# Patient Record
Sex: Male | Born: 1950 | Race: White | Hispanic: No | Marital: Single | Smoking: Former smoker
Health system: Southern US, Community
[De-identification: ages and names within clinical notes are randomized; demographics above are authoritative.]

## PROBLEM LIST (undated history)

## (undated) DIAGNOSIS — M1990 Unspecified osteoarthritis, unspecified site: Secondary | ICD-10-CM

## (undated) DIAGNOSIS — F32A Depression, unspecified: Secondary | ICD-10-CM

## (undated) DIAGNOSIS — IMO0002 Reserved for concepts with insufficient information to code with codable children: Secondary | ICD-10-CM

## (undated) DIAGNOSIS — F329 Major depressive disorder, single episode, unspecified: Secondary | ICD-10-CM

## (undated) DIAGNOSIS — I219 Acute myocardial infarction, unspecified: Secondary | ICD-10-CM

## (undated) DIAGNOSIS — E785 Hyperlipidemia, unspecified: Secondary | ICD-10-CM

## (undated) DIAGNOSIS — N529 Male erectile dysfunction, unspecified: Secondary | ICD-10-CM

## (undated) DIAGNOSIS — F419 Anxiety disorder, unspecified: Secondary | ICD-10-CM

## (undated) DIAGNOSIS — I10 Essential (primary) hypertension: Secondary | ICD-10-CM

## (undated) DIAGNOSIS — L578 Other skin changes due to chronic exposure to nonionizing radiation: Secondary | ICD-10-CM

## (undated) DIAGNOSIS — I2510 Atherosclerotic heart disease of native coronary artery without angina pectoris: Secondary | ICD-10-CM

## (undated) DIAGNOSIS — Z91030 Bee allergy status: Secondary | ICD-10-CM

## (undated) DIAGNOSIS — Z720 Tobacco use: Secondary | ICD-10-CM

## (undated) DIAGNOSIS — T7840XA Allergy, unspecified, initial encounter: Secondary | ICD-10-CM

## (undated) HISTORY — DX: Hyperlipidemia, unspecified: E78.5

## (undated) HISTORY — DX: Acute myocardial infarction, unspecified: I21.9

## (undated) HISTORY — DX: Other skin changes due to chronic exposure to nonionizing radiation: L57.8

## (undated) HISTORY — DX: Tobacco use: Z72.0

## (undated) HISTORY — DX: Essential (primary) hypertension: I10

## (undated) HISTORY — DX: Atherosclerotic heart disease of native coronary artery without angina pectoris: I25.10

## (undated) HISTORY — DX: Male erectile dysfunction, unspecified: N52.9

## (undated) HISTORY — PX: UPPER GASTROINTESTINAL ENDOSCOPY: SHX188

## (undated) HISTORY — DX: Anxiety disorder, unspecified: F41.9

## (undated) HISTORY — DX: Unspecified osteoarthritis, unspecified site: M19.90

## (undated) HISTORY — DX: Allergy, unspecified, initial encounter: T78.40XA

## (undated) HISTORY — PX: OTHER SURGICAL HISTORY: SHX169

## (undated) HISTORY — DX: Reserved for concepts with insufficient information to code with codable children: IMO0002

## (undated) HISTORY — DX: Depression, unspecified: F32.A

## (undated) HISTORY — DX: Bee allergy status: Z91.030

## (undated) HISTORY — DX: Major depressive disorder, single episode, unspecified: F32.9

---

## 1999-08-10 ENCOUNTER — Ambulatory Visit (HOSPITAL_BASED_OUTPATIENT_CLINIC_OR_DEPARTMENT_OTHER): Admission: RE | Admit: 1999-08-10 | Discharge: 1999-08-10 | Payer: Self-pay | Admitting: Orthopedic Surgery

## 1999-08-10 ENCOUNTER — Encounter (INDEPENDENT_AMBULATORY_CARE_PROVIDER_SITE_OTHER): Payer: Self-pay | Admitting: Specialist

## 2000-07-05 DIAGNOSIS — I219 Acute myocardial infarction, unspecified: Secondary | ICD-10-CM

## 2000-07-05 HISTORY — DX: Acute myocardial infarction, unspecified: I21.9

## 2000-12-13 ENCOUNTER — Encounter: Payer: Self-pay | Admitting: Emergency Medicine

## 2000-12-13 ENCOUNTER — Inpatient Hospital Stay (HOSPITAL_COMMUNITY): Admission: AD | Admit: 2000-12-13 | Discharge: 2000-12-15 | Payer: Self-pay | Admitting: Cardiology

## 2001-01-03 ENCOUNTER — Encounter: Admission: RE | Admit: 2001-01-03 | Discharge: 2001-04-03 | Payer: Self-pay | Admitting: Cardiology

## 2001-04-03 ENCOUNTER — Encounter (HOSPITAL_COMMUNITY): Admission: RE | Admit: 2001-04-03 | Discharge: 2001-07-02 | Payer: Self-pay | Admitting: Cardiology

## 2001-06-30 HISTORY — PX: OTHER SURGICAL HISTORY: SHX169

## 2001-07-14 ENCOUNTER — Ambulatory Visit (HOSPITAL_COMMUNITY): Admission: RE | Admit: 2001-07-14 | Discharge: 2001-07-14 | Payer: Self-pay | Admitting: Cardiology

## 2001-07-14 ENCOUNTER — Encounter: Payer: Self-pay | Admitting: Cardiology

## 2001-07-14 HISTORY — PX: OTHER SURGICAL HISTORY: SHX169

## 2001-10-04 ENCOUNTER — Encounter: Payer: Self-pay | Admitting: *Deleted

## 2001-10-04 ENCOUNTER — Observation Stay (HOSPITAL_COMMUNITY): Admission: EM | Admit: 2001-10-04 | Discharge: 2001-10-06 | Payer: Self-pay | Admitting: Emergency Medicine

## 2001-10-05 ENCOUNTER — Encounter: Payer: Self-pay | Admitting: *Deleted

## 2001-10-06 ENCOUNTER — Encounter: Payer: Self-pay | Admitting: Internal Medicine

## 2001-10-06 HISTORY — PX: ESOPHAGOGASTRODUODENOSCOPY: SHX1529

## 2002-04-27 ENCOUNTER — Encounter: Payer: Self-pay | Admitting: Internal Medicine

## 2002-04-27 ENCOUNTER — Ambulatory Visit (HOSPITAL_COMMUNITY): Admission: RE | Admit: 2002-04-27 | Discharge: 2002-04-27 | Payer: Self-pay | Admitting: Internal Medicine

## 2004-08-24 ENCOUNTER — Ambulatory Visit: Payer: Self-pay | Admitting: Internal Medicine

## 2004-11-10 ENCOUNTER — Ambulatory Visit: Payer: Self-pay | Admitting: Internal Medicine

## 2004-11-17 ENCOUNTER — Ambulatory Visit: Payer: Self-pay | Admitting: Internal Medicine

## 2005-02-10 ENCOUNTER — Ambulatory Visit: Payer: Self-pay | Admitting: Internal Medicine

## 2005-04-22 ENCOUNTER — Ambulatory Visit: Payer: Self-pay | Admitting: Internal Medicine

## 2005-08-30 ENCOUNTER — Ambulatory Visit: Payer: Self-pay | Admitting: Internal Medicine

## 2006-04-13 ENCOUNTER — Ambulatory Visit: Payer: Self-pay | Admitting: Internal Medicine

## 2006-08-24 ENCOUNTER — Ambulatory Visit: Payer: Self-pay | Admitting: Internal Medicine

## 2006-10-01 ENCOUNTER — Ambulatory Visit: Payer: Self-pay | Admitting: Family Medicine

## 2006-10-25 ENCOUNTER — Ambulatory Visit: Payer: Self-pay | Admitting: Internal Medicine

## 2006-11-14 ENCOUNTER — Ambulatory Visit: Payer: Self-pay | Admitting: Internal Medicine

## 2006-11-21 ENCOUNTER — Encounter: Payer: Self-pay | Admitting: Internal Medicine

## 2006-11-21 LAB — CONVERTED CEMR LAB
PTH: 10.2 pg/mL — ABNORMAL LOW (ref 14.0–72.0)
Vit D, 1,25-Dihydroxy: 14 — ABNORMAL LOW (ref 20–57)

## 2006-11-22 ENCOUNTER — Ambulatory Visit: Payer: Self-pay | Admitting: Internal Medicine

## 2006-11-22 LAB — CONVERTED CEMR LAB
Albumin: 3.8 g/dL (ref 3.5–5.2)
Alkaline Phosphatase: 55 units/L (ref 39–117)
BUN: 10 mg/dL (ref 6–23)
Basophils Relative: 0.3 % (ref 0.0–1.0)
Bilirubin, Direct: 0.1 mg/dL (ref 0.0–0.3)
Calcium: 8.9 mg/dL (ref 8.4–10.5)
Creatinine, Ser: 0.9 mg/dL (ref 0.4–1.5)
Direct LDL: 61 mg/dL
Glucose, Bld: 91 mg/dL (ref 70–99)
HCT: 37.7 % — ABNORMAL LOW (ref 39.0–52.0)
Lymphocytes Relative: 21.6 % (ref 12.0–46.0)
Monocytes Relative: 9 % (ref 3.0–11.0)
Neutro Abs: 6.3 10*3/uL (ref 1.4–7.7)
Neutrophils Relative %: 67.8 % (ref 43.0–77.0)
Potassium: 4.1 meq/L (ref 3.5–5.1)
RBC: 4.8 M/uL (ref 4.22–5.81)
RDW: 16.1 % — ABNORMAL HIGH (ref 11.5–14.6)
Total Bilirubin: 0.5 mg/dL (ref 0.3–1.2)
WBC: 9.2 10*3/uL (ref 4.5–10.5)

## 2006-12-12 ENCOUNTER — Ambulatory Visit: Payer: Self-pay | Admitting: Internal Medicine

## 2006-12-19 ENCOUNTER — Ambulatory Visit: Payer: Self-pay | Admitting: Endocrinology

## 2006-12-27 ENCOUNTER — Ambulatory Visit: Payer: Self-pay | Admitting: Endocrinology

## 2007-01-02 ENCOUNTER — Ambulatory Visit: Payer: Self-pay | Admitting: Internal Medicine

## 2007-01-02 LAB — CONVERTED CEMR LAB: Alkaline Phosphatase: 68 units/L (ref 39–117)

## 2007-03-20 ENCOUNTER — Encounter: Payer: Self-pay | Admitting: Endocrinology

## 2007-03-20 DIAGNOSIS — F32A Depression, unspecified: Secondary | ICD-10-CM | POA: Insufficient documentation

## 2007-03-20 DIAGNOSIS — R079 Chest pain, unspecified: Secondary | ICD-10-CM | POA: Insufficient documentation

## 2007-03-20 DIAGNOSIS — F411 Generalized anxiety disorder: Secondary | ICD-10-CM

## 2007-03-20 DIAGNOSIS — K625 Hemorrhage of anus and rectum: Secondary | ICD-10-CM | POA: Insufficient documentation

## 2007-03-20 DIAGNOSIS — F329 Major depressive disorder, single episode, unspecified: Secondary | ICD-10-CM

## 2007-03-20 DIAGNOSIS — F419 Anxiety disorder, unspecified: Secondary | ICD-10-CM | POA: Insufficient documentation

## 2007-03-21 ENCOUNTER — Ambulatory Visit: Payer: Self-pay | Admitting: Endocrinology

## 2007-03-21 LAB — CONVERTED CEMR LAB: PTH: 28.3 pg/mL (ref 14.0–72.0)

## 2007-04-10 ENCOUNTER — Ambulatory Visit: Payer: Self-pay | Admitting: Internal Medicine

## 2007-04-10 ENCOUNTER — Encounter: Payer: Self-pay | Admitting: Internal Medicine

## 2007-04-10 DIAGNOSIS — I2510 Atherosclerotic heart disease of native coronary artery without angina pectoris: Secondary | ICD-10-CM

## 2007-04-10 DIAGNOSIS — E782 Mixed hyperlipidemia: Secondary | ICD-10-CM | POA: Insufficient documentation

## 2007-04-10 DIAGNOSIS — J159 Unspecified bacterial pneumonia: Secondary | ICD-10-CM | POA: Insufficient documentation

## 2007-04-10 DIAGNOSIS — M810 Age-related osteoporosis without current pathological fracture: Secondary | ICD-10-CM | POA: Insufficient documentation

## 2007-04-10 DIAGNOSIS — I25729 Atherosclerosis of autologous artery coronary artery bypass graft(s) with unspecified angina pectoris: Secondary | ICD-10-CM | POA: Insufficient documentation

## 2007-05-16 ENCOUNTER — Ambulatory Visit: Payer: Self-pay | Admitting: Internal Medicine

## 2007-05-16 DIAGNOSIS — J189 Pneumonia, unspecified organism: Secondary | ICD-10-CM | POA: Insufficient documentation

## 2007-05-16 DIAGNOSIS — M1990 Unspecified osteoarthritis, unspecified site: Secondary | ICD-10-CM | POA: Insufficient documentation

## 2007-06-02 ENCOUNTER — Encounter: Payer: Self-pay | Admitting: Internal Medicine

## 2007-06-22 ENCOUNTER — Telehealth: Payer: Self-pay | Admitting: Internal Medicine

## 2007-07-03 ENCOUNTER — Telehealth: Payer: Self-pay | Admitting: Internal Medicine

## 2007-07-05 ENCOUNTER — Ambulatory Visit: Payer: Self-pay | Admitting: Internal Medicine

## 2007-07-05 ENCOUNTER — Encounter (INDEPENDENT_AMBULATORY_CARE_PROVIDER_SITE_OTHER): Payer: Self-pay | Admitting: *Deleted

## 2007-07-05 DIAGNOSIS — R05 Cough: Secondary | ICD-10-CM

## 2007-07-05 DIAGNOSIS — R059 Cough, unspecified: Secondary | ICD-10-CM | POA: Insufficient documentation

## 2007-07-11 ENCOUNTER — Ambulatory Visit: Payer: Self-pay | Admitting: Cardiology

## 2007-07-11 ENCOUNTER — Ambulatory Visit: Payer: Self-pay | Admitting: Internal Medicine

## 2007-07-11 DIAGNOSIS — N312 Flaccid neuropathic bladder, not elsewhere classified: Secondary | ICD-10-CM | POA: Insufficient documentation

## 2007-07-11 LAB — CONVERTED CEMR LAB
ALT: 28 units/L (ref 0–53)
Alkaline Phosphatase: 66 units/L (ref 39–117)
BUN: 18 mg/dL (ref 6–23)
CO2: 29 meq/L (ref 19–32)
Calcium: 9.3 mg/dL (ref 8.4–10.5)
Chloride: 101 meq/L (ref 96–112)
GFR calc Af Amer: 99 mL/min
GFR calc non Af Amer: 82 mL/min
Total CHOL/HDL Ratio: 2.8
Triglycerides: 209 mg/dL (ref 0–149)

## 2007-07-18 ENCOUNTER — Ambulatory Visit: Payer: Self-pay | Admitting: Internal Medicine

## 2007-07-26 ENCOUNTER — Ambulatory Visit: Payer: Self-pay | Admitting: Internal Medicine

## 2007-09-26 ENCOUNTER — Ambulatory Visit: Payer: Self-pay | Admitting: Internal Medicine

## 2007-09-26 DIAGNOSIS — J019 Acute sinusitis, unspecified: Secondary | ICD-10-CM | POA: Insufficient documentation

## 2007-10-30 ENCOUNTER — Encounter: Payer: Self-pay | Admitting: Internal Medicine

## 2007-12-05 ENCOUNTER — Encounter: Payer: Self-pay | Admitting: Internal Medicine

## 2007-12-27 ENCOUNTER — Encounter: Payer: Self-pay | Admitting: Internal Medicine

## 2008-06-05 ENCOUNTER — Telehealth: Payer: Self-pay | Admitting: Internal Medicine

## 2008-08-06 ENCOUNTER — Telehealth: Payer: Self-pay | Admitting: Internal Medicine

## 2008-09-16 ENCOUNTER — Ambulatory Visit: Payer: Self-pay | Admitting: Internal Medicine

## 2008-09-16 DIAGNOSIS — M542 Cervicalgia: Secondary | ICD-10-CM | POA: Insufficient documentation

## 2008-09-20 ENCOUNTER — Telehealth: Payer: Self-pay | Admitting: Internal Medicine

## 2008-10-08 ENCOUNTER — Ambulatory Visit: Payer: Self-pay | Admitting: Internal Medicine

## 2008-10-08 DIAGNOSIS — R197 Diarrhea, unspecified: Secondary | ICD-10-CM | POA: Insufficient documentation

## 2009-02-10 ENCOUNTER — Encounter: Payer: Self-pay | Admitting: Internal Medicine

## 2009-07-01 ENCOUNTER — Telehealth: Payer: Self-pay | Admitting: Internal Medicine

## 2009-07-07 ENCOUNTER — Ambulatory Visit: Payer: Self-pay | Admitting: Internal Medicine

## 2009-07-29 ENCOUNTER — Ambulatory Visit: Payer: Self-pay | Admitting: Internal Medicine

## 2009-08-01 LAB — CONVERTED CEMR LAB
ALT: 30 units/L (ref 0–53)
AST: 35 units/L (ref 0–37)
Albumin: 3.9 g/dL (ref 3.5–5.2)
Alkaline Phosphatase: 63 units/L (ref 39–117)
Basophils Relative: 1.2 % (ref 0.0–3.0)
CO2: 28 meq/L (ref 19–32)
Eosinophils Relative: 1.5 % (ref 0.0–5.0)
Glucose, Bld: 83 mg/dL (ref 70–99)
HCT: 43.6 % (ref 39.0–52.0)
Lymphs Abs: 1.9 10*3/uL (ref 0.7–4.0)
MCV: 83.8 fL (ref 78.0–100.0)
Monocytes Absolute: 0.7 10*3/uL (ref 0.1–1.0)
Neutrophils Relative %: 57.8 % (ref 43.0–77.0)
Potassium: 3.9 meq/L (ref 3.5–5.1)
RBC: 5.2 M/uL (ref 4.22–5.81)
Sodium: 139 meq/L (ref 135–145)
Total Protein: 7.9 g/dL (ref 6.0–8.3)
WBC: 6.6 10*3/uL (ref 4.5–10.5)

## 2009-08-26 ENCOUNTER — Ambulatory Visit: Payer: Self-pay | Admitting: Internal Medicine

## 2009-08-26 DIAGNOSIS — J209 Acute bronchitis, unspecified: Secondary | ICD-10-CM | POA: Insufficient documentation

## 2009-08-27 ENCOUNTER — Telehealth: Payer: Self-pay | Admitting: Internal Medicine

## 2009-08-29 ENCOUNTER — Ambulatory Visit: Payer: Self-pay | Admitting: Internal Medicine

## 2009-09-02 ENCOUNTER — Encounter (INDEPENDENT_AMBULATORY_CARE_PROVIDER_SITE_OTHER): Payer: Self-pay | Admitting: *Deleted

## 2009-09-04 ENCOUNTER — Telehealth (INDEPENDENT_AMBULATORY_CARE_PROVIDER_SITE_OTHER): Payer: Self-pay

## 2009-09-08 ENCOUNTER — Ambulatory Visit: Payer: Self-pay | Admitting: Internal Medicine

## 2009-09-08 ENCOUNTER — Ambulatory Visit: Payer: Self-pay

## 2009-09-08 ENCOUNTER — Encounter (HOSPITAL_COMMUNITY): Admission: RE | Admit: 2009-09-08 | Discharge: 2009-11-04 | Payer: Self-pay | Admitting: Internal Medicine

## 2009-09-10 ENCOUNTER — Telehealth: Payer: Self-pay | Admitting: Internal Medicine

## 2009-11-28 ENCOUNTER — Ambulatory Visit: Payer: Self-pay | Admitting: Internal Medicine

## 2009-11-28 LAB — CONVERTED CEMR LAB
ALT: 30 units/L (ref 0–53)
AST: 34 units/L (ref 0–37)
Albumin: 4 g/dL (ref 3.5–5.2)
Alkaline Phosphatase: 61 units/L (ref 39–117)
CO2: 28 meq/L (ref 19–32)
Calcium: 9.1 mg/dL (ref 8.4–10.5)
Glucose, Bld: 103 mg/dL — ABNORMAL HIGH (ref 70–99)
HDL: 79.5 mg/dL (ref >39.00)
Sodium: 139 meq/L (ref 135–145)
Total Protein: 7.9 g/dL (ref 6.0–8.3)

## 2010-02-05 ENCOUNTER — Telehealth: Payer: Self-pay | Admitting: Internal Medicine

## 2010-03-03 ENCOUNTER — Ambulatory Visit: Payer: Self-pay | Admitting: Internal Medicine

## 2010-06-01 ENCOUNTER — Ambulatory Visit: Payer: Self-pay | Admitting: Internal Medicine

## 2010-06-01 DIAGNOSIS — E559 Vitamin D deficiency, unspecified: Secondary | ICD-10-CM | POA: Insufficient documentation

## 2010-07-26 ENCOUNTER — Encounter: Payer: Self-pay | Admitting: Internal Medicine

## 2010-08-04 NOTE — Progress Notes (Signed)
Summary: REQ FOR RX  Phone Note Call from Patient Call back at Home Phone [phone]   Summary of Call: Patient is requesting rx for zpak. C/o sinus drainage/congestion, non productive cough and sweats. He is taking otc akaseltzer w/no relief.  Initial call taken by: Lamar Sprinkles, CMA,  September 10, 2009 5:55 PM  Follow-up for Phone Call        ok Follow-up by: Tresa Garter MD,  September 11, 2009 7:53 AM    New/Updated Medications: ZITHROMAX Z-PAK 250 MG TABS (AZITHROMYCIN) as dirrected Prescriptions: ZITHROMAX Z-PAK 250 MG TABS (AZITHROMYCIN) as dirrected  #1 x 0   Entered by:   Ami Bullins CMA   Authorized by:   Tresa Garter MD   Signed by:   Bill Salinas CMA on 09/11/2009   Method used:   [facility]        [facility] (retail)       [address]       [place], [place]  [address]       Ph: [phone] or [phone]       Fax: [phone]   RxID:   [id number]

## 2010-08-04 NOTE — Assessment & Plan Note (Signed)
Summary: 3 mth fu--stc   Vital Signs:  Patient profile:   60 year old male Height:      70 inches Weight:      172 pounds BMI:     24.77 O2 Sat:      97 % on Room air Temp:     97.5 degrees F oral Pulse rate:   73 / minute BP sitting:   130 / 80  (left arm) Cuff size:   regular  Vitals Entered By: Bill Salinas CMA (Nov 28, 2009 8:57 AM)  O2 Flow:  Room air CC: follow-up visit/ ab   Primary Care Provider:  Tresa Garter MD  CC:  follow-up visit/ ab.  History of Present Illness: The patient presents for a follow up of back pain, anxiety, depression and LBP.   Current Medications (verified): 1)  Adult Aspirin Low Strength 81 Mg  Tbdp (Aspirin) .... Take 1 By Mouth Qd 2)  Levitra 20 Mg Tabs (Vardenafil Hcl) .... 1/2-1 Once Daily As Needed 3)  Cymbalta 30 Mg Cpep (Duloxetine Hcl) .[name].. 1 By Mouth Qd 4)  Diazepam 10 Mg Tabs (Diazepam) .... 1/2 or 1 By Mouth Two Times A Day As Needed 5)  Fish Oil 1000 Mg Caps (Omega-3 Fatty Acids) .[name].. 1 By Mouth Two Times A Day 6)  Tricor 145 Mg Tabs (Fenofibrate) .[name].. 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 2010 Divorced Current Smoker Regular exercise-no  Review of Systems  The patient denies anorexia, dyspnea on exertion, and abdominal pain.    Physical Exam  General:  alert, well-developed, well-nourished, and cooperative to examination.   mildly ill - smells of smoke Nose:  External nasal examination shows no deformity or inflammation. Nasal mucosa are pink and moist without lesions or exudates. Mouth:  teeth and gums in good repair; mucous membranes moist, without lesions or  ulcers. oropharynx clear without exudate, mod erythema. +yellow green PND Lungs:  Normal respiratory effort, chest expands symmetrically. Lungs are clear to auscultation, no crackles or wheezes. Heart:  Normal rate and regular rhythm. S1 and S2 normal without gallop, murmur, click, rub or other extra sounds. Abdomen:  Bowel sounds positive,abdomen soft and non-tender without masses, organomegaly or hernias noted. Msk:  No deformity or scoliosis noted of thoracic or lumbar spine.   Extremities:  No clubbing, cyanosis, edema, or deformity noted with normal full range of motion of all joints.   Neurologic:  alert & oriented X3.   Skin:  Intact without suspicious lesions or rashes Psych:  Oriented X3.  good eye contact, not anxious appearing, and not depressed appearing.     Impression & Recommendations:  Problem # 1:  HYPERLIPIDEMIA (ICD-272.4) Assessment Comment Only  His updated medication list for this problem includes:    Tricor 145 Mg Tabs (Fenofibrate) .[name]... 1 by mouth once daily for high triglycerides Risks of noncompliance with treatment discussed. Compliance encouraged.  Labs is pending   Problem # 2:  ERECTILE DYSFUNCTION (ICD-302.72) Assessment: Improved  His updated medication list for this problem includes:    Levitra 20 Mg Tabs (Vardenafil hcl) .[name]... 1/2-1 once daily as needed  Problem # 3:  COPD (ICD-496) Assessment: Unchanged  Problem # 4:  SMOKER (ICD-305.1)  restarted  Encouraged smoking cessation and discussed different methods for smoking cessation.   Problem # 5:  OSTEOARTHRITIS (ICD-715.90) Assessment: Improved Better since he has semi-retired His updated medication list for this problem includes:    Adult Aspirin Low Strength 81 Mg Tbdp (Aspirin) .[name]... Take 1 by mouth qd  Complete Medication List: 1)  Adult Aspirin Low Strength 81 Mg Tbdp (Aspirin) .... Take 1 by mouth qd 2)  Levitra 20 Mg Tabs (Vardenafil hcl) .... 1/2-1 once daily as needed 3)  Cymbalta  30 Mg Cpep (Duloxetine hcl) .[name].. 1 by mouth qd 4)  Diazepam 10 Mg Tabs (Diazepam) .... 1/2 or 1 by mouth two times a day as needed 5)  Fish Oil 1000 Mg Caps (Omega-3 fatty acids) .[name].. 1 by mouth two times a day 6)  Tricor 145 Mg Tabs (Fenofibrate) .[name].. 1 by mouth once daily for high triglycerides  Patient Instructions: 1)  Please schedule a follow-up appointment in 3 months. Prescriptions: TRICOR 145 MG TABS (FENOFIBRATE) 1 by mouth once daily for high triglycerides  #30 x 12   Entered and Authorized by:   Tresa Garter MD   Signed by:   Tresa Garter MD on 11/28/2009   Method used:   Print then Give to Patient   RxID:   [id number]

## 2010-08-04 NOTE — Assessment & Plan Note (Signed)
Summary: 3 mos f/u #/ cd   Vital Signs:  Patient profile:   60 year old male Height:      70 inches (177.80 cm) Weight:      176.75 pounds (80.34 kg) BMI:     25.45 O2 Sat:      97 % on Room air Temp:     97.5 degrees F (36.39 degrees C) oral Pulse rate:   63 / minute BP sitting:   122 / 82  (left arm) Cuff size:   regular  Vitals Entered By: Brenton Grills MA (March 03, 2010 7:54 AM)  O2 Flow:  Room air CC: 3 month F/U/aj   Primary Care Provider:  Tresa Garter MD  CC:  3 month F/U/aj.  History of Present Illness: The patient presents for a follow up of anxiety, CAD, hyperlipidemia, depression, OA   Current Medications (verified): 1)  Adult Aspirin Low Strength 81 Mg  Tbdp (Aspirin) .... Take 1 By Mouth Qd 2)  Levitra 20 Mg Tabs (Vardenafil Hcl) .... 1/2-1 Once Daily As Needed 3)  Cymbalta 30 Mg Cpep (Duloxetine Hcl) .[name].. 1 By Mouth Qd 4)  Diazepam 10 Mg Tabs (Diazepam) .... 1/2 or 1 By Mouth Two Times A Day As Needed 5)  Fish Oil 1000 Mg Caps (Omega-3 Fatty Acids) .[name].. 1 By Mouth Two Times A Day 6)  Tricor 145 Mg Tabs (Fenofibrate) .[name].. 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 2010 Divorced Current Smoker Regular exercise-no  Review of Systems  The patient denies fever, chest pain, dyspnea on exertion, and abdominal pain.    Physical Exam  General:  alert, well-developed, well-nourished, and cooperative to examination.   mildly ill - smells of smoke Nose:  External nasal examination shows no deformity or inflammation. Nasal mucosa are pink and moist without lesions or exudates. Mouth:  teeth and gums in  good repair; mucous membranes moist, without lesions or ulcers. oropharynx clear without exudate, mod erythema. +yellow green PND Lungs:  Normal respiratory effort, chest expands symmetrically. Lungs are clear to auscultation, no crackles or wheezes. Heart:  Normal rate and regular rhythm. S1 and S2 normal without gallop, murmur, click, rub or other extra sounds. Abdomen:  Bowel sounds positive,abdomen soft and non-tender without masses, organomegaly or hernias noted. Msk:  No deformity or scoliosis noted of thoracic or lumbar spine.   Extremities:  No clubbing, cyanosis, edema, or deformity noted with normal full range of motion of all joints.   Neurologic:  alert & oriented X3.   Skin:  Intact without suspicious lesions or rashes Psych:  Oriented X3.  good eye contact, not anxious appearing, and not depressed appearing.     Impression & Recommendations:  Problem # 1:  OSTEOARTHRITIS (ICD-715.90) Assessment Unchanged  His updated medication list for this problem includes:    Adult Aspirin Low Strength 81 Mg Tbdp (Aspirin) .[name]... Take 1 by mouth qd  Problem # 2:  HYPERLIPIDEMIA (ICD-272.4) Assessment: Unchanged  His updated medication list for this problem includes:    Tricor 145 Mg Tabs (Fenofibrate) .[name]... 1 by mouth once daily for high triglycerides  Problem # 3:  DEPRESSION (ICD-311) Assessment: Unchanged  His updated medication list for this problem includes:  Cymbalta 30 Mg Cpep (Duloxetine hcl) .[name]... 1 by mouth qd    Diazepam 10 Mg Tabs (Diazepam) .[name]... 1/2 or 1 by mouth two times a day as needed  Problem # 4:  ANXIETY (ICD-300.00) Assessment: Unchanged  His updated medication list for this problem includes:    Cymbalta 30 Mg Cpep (Duloxetine hcl) .[name]... 1 by mouth qd    Diazepam 10 Mg Tabs (Diazepam) .[name]... 1/2 or 1 by mouth two times a day as needed  Problem # 5:  CORONARY ARTERY DISEASE (ICD-414.00) Assessment: Unchanged  His updated medication list for this problem  includes:    Adult Aspirin Low Strength 81 Mg Tbdp (Aspirin) .[name]... Take 1 by mouth qd  Problem # 6:  ERECTILE DYSFUNCTION, ORGANIC (ICD-607.84) Assessment: Unchanged  His updated medication list for this problem includes:    Levitra 20 Mg Tabs (Vardenafil hcl) .[name]... 1/2-1 once daily as needed  Problem # 7:  SMOKER (ICD-305.1) Assessment: Unchanged  Encouraged smoking cessation and discussed different methods for smoking cessation.   Complete Medication List: 1)  Adult Aspirin Low Strength 81 Mg Tbdp (Aspirin) .... Take 1 by mouth qd 2)  Levitra 20 Mg Tabs (Vardenafil hcl) .... 1/2-1 once daily as needed 3)  Cymbalta 30 Mg Cpep (Duloxetine hcl) .[name].. 1 by mouth qd 4)  Diazepam 10 Mg Tabs (Diazepam) .... 1/2 or 1 by mouth two times a day as needed 5)  Fish Oil 1000 Mg Caps (Omega-3 fatty acids) .[name].. 1 by mouth two times a day 6)  Tricor 145 Mg Tabs (Fenofibrate) .[name].. 1 by mouth once daily for high triglycerides 7)  Loratadine 10 Mg Tabs (Loratadine) .[name].. 1 by mouth once daily as needed allergies  Other Orders: Admin 1st Vaccine (56213) Flu Vaccine 24yrs + (08657)  Patient Instructions: 1)  Please schedule a follow-up appointment in 3 months well w/labs. Prescriptions: LORATADINE 10 MG TABS (LORATADINE) 1 by mouth once daily as needed allergies  #30 x 12   Entered and Authorized by:   Tresa Garter MD   Signed by:   Tresa Garter MD on 03/03/2010   Method used:   Print then Give to Patient   RxID:   [id number]     .lbflu Flu Vaccine Consent Questions     Do you have a history of severe allergic reactions to this vaccine? no    Any prior history of allergic reactions to egg and/or gelatin? no    Do you have a sensitivity to the preservative Thimersol? no    Do you have a past history of Guillan-Barre Syndrome? no    Do you currently have an acute febrile illness? no    Have you ever had a severe reaction to latex? no    Vaccine information given and  explained to patient? yes    Are you currently pregnant? no    Lot Number:[id number]   Exp Date:01/02/2011   Site Given  Left Deltoid IM

## 2010-08-04 NOTE — Assessment & Plan Note (Signed)
Summary: 3 mos f/u #/cd   Vital Signs:  Patient profile:   60 year old male Height:      70 inches Weight:      178 pounds BMI:     25.63 Temp:     98.0 degrees F oral Pulse rate:   72 / minute Pulse rhythm:   regular Resp:     16 per minute BP sitting:   112 / 60  (left arm) Cuff size:   regular  Vitals Entered By: Lanier Prude, CMA(AAMA) (June 01, 2010 7:54 AM) CC: 3 mo f/u  Is Patient Diabetic? No Comments pt  is not taking Cymbalta   Primary Care Provider:  Tresa Garter MD  CC:  3 mo f/u .  History of Present Illness: The patient presents for a follow up of back pain, anxiety, depression dyslipidemia. He weaned himself off Cymbalta..  Current Medications (verified): 1)  Adult Aspirin Low Strength 81 Mg  Tbdp (Aspirin) .... Take 1 By Mouth Qd 2)  Levitra 20 Mg Tabs (Vardenafil Hcl) .... 1/2-1 Once Daily As Needed 3)  Cymbalta 30 Mg Cpep (Duloxetine Hcl) .[name].. 1 By Mouth Qd 4)  Diazepam 10 Mg Tabs (Diazepam) .... 1/2 or 1 By Mouth Two Times A Day As Needed 5)  Fish Oil 1000 Mg Caps (Omega-3 Fatty Acids) .[name].. 1 By Mouth Two Times A Day 6)  Tricor 145 Mg Tabs (Fenofibrate) .[name].. 1 By Mouth Once Daily For High Triglycerides 7)  Loratadine 10 Mg Tabs (Loratadine) .[name].. 1 By Mouth Once Daily As Needed Allergies  Allergies (verified): 1)  Lipitor  Past History:  Social History: Last updated: 07/07/2009 Former Smoker 1 ppd Occupation: unempl. 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 brother deceased - cancer  Social History: Reviewed history from 07/07/2009 and no changes required. Former Smoker 1 ppd Occupation: unempl. 2010 Divorced Current Smoker Regular  exercise-no  Review of Systems  The patient denies fever, chest pain, abdominal pain, hematochezia, and depression.    Physical Exam  General:  alert, well-developed, well-nourished, and cooperative to examination.   mildly ill - smells of smoke Nose:  External nasal examination shows no deformity or inflammation. Nasal mucosa are pink and moist without lesions or exudates. Mouth:  teeth and gums in good repair; mucous membranes moist, without lesions or ulcers. oropharynx clear without exudate, mod erythema. +yellow green PND Lungs:  Normal respiratory effort, chest expands symmetrically. Lungs are clear to auscultation, no crackles or wheezes. Heart:  Normal rate and regular rhythm. S1 and S2 normal without gallop, murmur, click, rub or other extra sounds. Abdomen:  Bowel sounds positive,abdomen soft and non-tender without masses, organomegaly or hernias noted. Msk:  No deformity or scoliosis noted of thoracic or lumbar spine.   Extremities:  No clubbing, cyanosis, edema, or deformity noted with normal full range of motion of all joints.   Neurologic:  alert & oriented X3.   Skin:  Intact without suspicious lesions or rashes Psych:  Oriented X3.  good eye contact, not anxious appearing, and not depressed appearing.     Impression & Recommendations:  Problem # 1:  CORONARY ARTERY DISEASE (ICD-414.00) Assessment Unchanged  His updated medication list for this problem includes:    Adult Aspirin Low Strength 81 Mg Tbdp (Aspirin) .Marland KitchenMarland KitchenMarland KitchenMarland Kitchen  Take 1 by mouth qd  Problem # 2:  DEPRESSION (ICD-311) Assessment: Improved  The following medications were removed from the medication list:    Cymbalta 30 Mg Cpep (Duloxetine hcl) .[name]... 1 by mouth qd His updated medication list for this problem includes:    Diazepam 10 Mg Tabs (Diazepam) .[name]... 1/2 or 1 by mouth two times a day as needed  Problem # 3:  ANXIETY (ICD-300.00) Assessment: Unchanged  The following medications were removed from the  medication list:    Cymbalta 30 Mg Cpep (Duloxetine hcl) .[name]... 1 by mouth qd His updated medication list for this problem includes:    Diazepam 10 Mg Tabs (Diazepam) .[name]... 1/2 or 1 by mouth two times a day as needed  Problem # 4:  VITAMIN D DEFICIENCY (ICD-268.9) Assessment: Comment Only Risks of noncompliance with treatment discussed. Compliance encouraged. Restart Vit D  Problem # 5:  SMOKER (ICD-305.1) Assessment: Deteriorated  Encouraged smoking cessation and discussed different methods for smoking cessation.   Complete Medication List: 1)  Adult Aspirin Low Strength 81 Mg Tbdp (Aspirin) .... Take 1 by mouth qd 2)  Levitra 20 Mg Tabs (Vardenafil hcl) .... 1/2-1 once daily as needed 3)  Diazepam 10 Mg Tabs (Diazepam) .... 1/2 or 1 by mouth two times a day as needed 4)  Fish Oil 1000 Mg Caps (Omega-3 fatty acids) .[name].. 1 by mouth two times a day 5)  Loratadine 10 Mg Tabs (Loratadine) .[name].. 1 by mouth once daily as needed allergies 6)  Lopid 600 Mg Tabs (Gemfibrozil) .[name].. 1 by mouth qd 7)  Vitamin D 1000 Unit Tabs (Cholecalciferol) .[name].. 1 by mouth qd  Other Orders: Tdap => 30yrs IM (13086) Admin 1st Vaccine (57846)  Patient Instructions: 1)  Please schedule a follow-up appointment in 3 months well w/labs and Vit D 268.9. Prescriptions: DIAZEPAM 10 MG TABS (DIAZEPAM) 1/2 or 1 by mouth two times a day as needed  #60 x 2   Entered and Authorized by:   Tresa Garter MD   Signed by:   Tresa Garter MD on 06/01/2010   Method used:   Print then Give to Patient   RxID:   [id number] VITAMIN D 1000 UNIT TABS (CHOLECALCIFEROL) 1 by mouth qd  #100 x 3   Entered and Authorized by:   Tresa Garter MD   Signed by:   Tresa Garter MD on 06/01/2010   Method used:   Print then Give to Patient   RxID:   [id number] LOPID 600 MG TABS (GEMFIBROZIL) 1 by mouth qd  #30 x 11   Entered and Authorized by:   Tresa Garter MD   Signed by:   Tresa Garter MD  on 06/01/2010   Method used:   Print then Give to Patient   RxID:   [id number]    Orders Added: 1)  Tdap => 53yrs IM [84166] 2)  Admin 1st Vaccine [90471] 3)  Est. Patient Level IV [06301]   Immunizations Administered:  Tetanus Vaccine:    Vaccine Type: Tdap    Site: left deltoid    Mfr: GlaxoSmithKline    Dose: 0.5 ml    Route: IM    Given by: Lanier Prude, CMA(AAMA)    Exp. Date: 04/23/2012    Lot #: [id number]    VIS given: 05/22/08 version given June 01, 2010.   Immunizations Administered:  Tetanus Vaccine:    Vaccine Type: Tdap    Site: left deltoid  Mfr: GlaxoSmithKline    Dose: 0.5 ml    Route: IM    Given by: Lanier Prude, CMA(AAMA)    Exp. Date: 04/23/2012    Lot #: [id number]    VIS given: 05/22/08 version given June 01, 2010.

## 2010-08-04 NOTE — Progress Notes (Signed)
  Phone Note Call from Patient   Caller: Patient Reason for Call: Talk to Nurse Summary of Call: Pt states rx for z-pack was sent to wrong pharmacy. [facility] Initial call taken by: Elnita Maxwell  Follow-up for Phone Call        [facility] Follow-up by: Orlan Leavens,  August 27, 2009 9:13 AM    Prescriptions: AZITHROMYCIN 250 MG TABS (AZITHROMYCIN) 2 tabs by mouth today, then 1 by mouth daily starting tomorrow  #6 x 0   Entered by:   Orlan Leavens   Authorized by:   Newt Lukes MD   Signed by:   Orlan Leavens on 08/27/2009   Method used:   [facility]        [facility] [facility]ail[facility]       [address]       [place], [place]  [address]       Ph: [phone] or [phone]       Fax: [phone]   RxID:   [id number]

## 2010-08-04 NOTE — Assessment & Plan Note (Signed)
Summary: FU   STC   Vital Signs:  Patient profile:   60 year old male Weight:      170 pounds BMI:     23.14 Temp:     98.5 degrees F oral Pulse rate:   95 / minute BP sitting:   114 / 74  (left arm)  Vitals Entered By: Tora Perches (July 07, 2009 1:38 PM) CC: f/u Is Patient Diabetic? No   CC:  f/u.  History of Present Illness: The patient presents for a follow up of hypertension, CAD, hyperlipidemia. C/o anxiety   Current Medications (verified): 1)  Adult Aspirin Low Strength 81 Mg  Tbdp (Aspirin) .... Take 1 By Mouth Qd 2)  Cymbalta 60 Mg  Cpep (Duloxetine Hcl) .... Take 1 By Mouth Qd 3)  Diazepam 5 Mg  Tabs (Diazepam) .... Take 1 By Mouth Two Times A Day Prn 4)  Levitra 20 Mg Tabs (Vardenafil Hcl) .... 1/2-1 Once Daily As Needed 5)  Vitamin D3 1000 Unit  Tabs (Cholecalciferol) .... Take 1 Tablet By Mouth Once A Day 6)  Symbicort 160-4.5 Mcg/act  Aero (Budesonide-Formoterol Fumarate) .... 2 Puffs Two Times A Day  Allergies: 1)  Lipitor  Past History:  Social History: Last updated: 07/07/2009 Former Smoker 1 ppd Occupation: unempl. 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 2010 Divorced Current Smoker Regular exercise-no  Review of Systems  The patient denies fever, chest pain, and abdominal pain.    Physical Exam  General:  Well-developed,well-nourished,in no acute distress; alert,appropriate and cooperative throughout examination Eyes:  No corneal or conjunctival inflammation noted. EOMI. Perrla. Funduscopic exam benign, without hemorrhages, exudates or papilledema. Vision grossly normal. Nose:  External nasal examination shows no deformity or inflammation. Nasal mucosa are pink and moist without  lesions or exudates. Mouth:  Oral mucosa and oropharynx without lesions or exudates.  Teeth in good repair. Lungs:  Normal respiratory effort, chest expands symmetrically. Lungs are clear to auscultation, no crackles or wheezes. Heart:  Normal rate and regular rhythm. S1 and S2 normal without gallop, murmur, click, rub or other extra sounds. Abdomen:  Bowel sounds positive,abdomen soft and non-tender without masses, organomegaly or hernias noted. Msk:  No deformity or scoliosis noted of thoracic or lumbar spine.   Extremities:  No clubbing, cyanosis, edema, or deformity noted with normal full range of motion of all joints.   Neurologic:  alert & oriented X3.   Skin:  Intact without suspicious lesions or rashes Psych:  Oriented X3.     Impression & Recommendations:  Problem # 1:  OSTEOARTHRITIS (ICD-715.90) Assessment Comment Only  The following medications were removed from the medication list:    Hydrocodone-acetaminophen 10-325 Mg Tabs (Hydrocodone-acetaminophen) .[name]... 1 by mouth up to 3 time per day as needed for pain His updated medication list for this problem includes:    Adult Aspirin Low Strength 81 Mg Tbdp (Aspirin) .[name]... Take 1 by mouth qd  Problem # 2:  CORONARY ARTERY DISEASE (ICD-414.00) Assessment: Unchanged  His updated medication list for this problem includes:    Adult Aspirin Low Strength 81 Mg Tbdp (Aspirin) .[name]... Take 1 by mouth qd  Problem # 3:  HYPERLIPIDEMIA (ICD-272.4) Assessment: Comment Only Statin intol  Problem # 4:  COPD (ICD-496) Assessment: Unchanged  His updated medication list for this problem  includes:    Symbicort 160-4.5 Mcg/act Aero (Budesonide-formoterol fumarate) .[name]... 2 puffs two times a day  Problem # 5:  ANXIETY (ICD-300.00) Assessment: Deteriorated  The following medications were removed from the medication list:    Cymbalta 60 Mg Cpep (Duloxetine hcl) .[name]... Take 1 by mouth qd    Diazepam 5 Mg Tabs (Diazepam) .[name]... Take 1 by mouth  two times a day prn His updated medication list for this problem includes:    Cymbalta 30 Mg Cpep (Duloxetine hcl) .[name]... 1 by mouth qd    Diazepam 10 Mg Tabs (Diazepam) .[name]... 1/2 or 1 by mouth two times a day as needed Risks vs benefits and controversies of a long term controlled substances use were discussed.   Problem # 6:  SMOKER (ICD-305.1) Assessment: Unchanged  Encouraged smoking cessation and discussed different methods for smoking cessation.   Complete Medication List: 1)  Adult Aspirin Low Strength 81 Mg Tbdp (Aspirin) .... Take 1 by mouth qd 2)  Levitra 20 Mg Tabs (Vardenafil hcl) .... 1/2-1 once daily as needed 3)  Vitamin D3 1000 Unit Tabs (Cholecalciferol) .... Take 1 tablet by mouth once a day 4)  Symbicort 160-4.5 Mcg/act Aero (Budesonide-formoterol fumarate) .... 2 puffs two times a day 5)  Cymbalta 30 Mg Cpep (Duloxetine hcl) .[name].. 1 by mouth qd 6)  Diazepam 10 Mg Tabs (Diazepam) .... 1/2 or 1 by mouth two times a day as needed  Other Orders: Admin 1st Vaccine ([address]) Flu Vaccine 69yrs + ([address])  Patient Instructions: 1)  Please schedule a follow-up appointment in 3 months. 2)  BMP prior to visit, ICD-9: 3)  Hepatic Panel prior to visit, ICD-9: 4)  Lipid Panel prior to visit, ICD-9: 5)  TSH prior to visit, ICD-9: 6)  CBC w/ Diff prior to visit, ICD-9:272.0  995.20 Prescriptions: DIAZEPAM 10 MG TABS (DIAZEPAM) 1/2 or 1 by mouth two times a day as needed  #60 x 3   Entered and Authorized by:   Tresa Garter MD   Signed by:   Tresa Garter MD on 07/07/2009   Method used:   Print then Give to Patient   RxID:   [id number] CYMBALTA 30 MG CPEP (DULOXETINE HCL) 1 by mouth qd  #30 x 3   Entered and Authorized by:   Tresa Garter MD   Signed by:   Tresa Garter MD on 07/07/2009   Method used:   [facility]        [facility]  [address] (retail)       [address]       [place], [place]  [address]       Ph: [phone] or  [phone]       Fax: [phone]   RxID:   [id number]    Influenza Vaccine (to be given today)   Flu Vaccine Consent Questions     Do you have a history of severe allergic reactions to this vaccine? no    Any prior history of allergic reactions to egg and/or gelatin? no    Do you have a sensitivity to the preservative Thimersol? no    Do you have a past history of Guillan-Barre Syndrome? no    Do you currently have an acute febrile illness? no    Have you ever had a severe reaction to latex? no    Vaccine information given and explained to patient? yes    Are you currently pregnant? no    Lot Number:[id number]  Exp Date:01/01/2010   Site Given  Left Deltoid IMbflu

## 2010-08-04 NOTE — Letter (Signed)
Summary: [facility] Consult Scheduled Letter  [facility]  [address] [place], [place] [address]   Phone: [phone]  Fax: [phone]      09/02/2009 MRN: [id number]  [name] [address] [place], [place]  [address]    Dear Mr. Delker,      We have scheduled an appointment for you. At the recommendation of Dr.Pltnikov, we have scheduled you a consult with [facility].(cardiolite)on March 7,2011 at 7:30 AM .Their phone number is [phone] .  If this appointment day and time is not convenient for you, please feel free to call the office of the doctor you are being referred to at the number listed above and reschedule the appointment.  Selena Batten [address]  [place] [address]  Thank you,  Patient Care Coordinator [facility]

## 2010-08-04 NOTE — Progress Notes (Signed)
Summary: Rf Valium  Phone Note Refill Request Message from:  Fax from Pharmacy  Refills Requested: Medication #1:  DIAZEPAM 10 MG TABS 1/2 or 1 by mouth two times a day as needed   Dosage confirmed as above?Dosage Confirmed   Supply Requested: 60   [phone]   Method Requested: Telephone to Pharmacy Initial call taken by: Lanier Prude, [facility]),  February 05, 2010 11:40 AM  Follow-up for Phone Call        ok 2 ref Follow-up by: Tresa Garter MD,  February 06, 2010 7:41 AM  Additional Follow-up for Phone Call Additional follow up Details #1::        Rx called to pharmacy Additional Follow-up by: Lanier Prude, [facility]),  February 06, 2010 8:51 AM    Prescriptions: DIAZEPAM 10 MG TABS (DIAZEPAM) 1/2 or 1 by mouth two times a day as needed  #60 x 2   Entered by:   Lanier Prude, [facility])   Authorized by:   Tresa Garter MD   Signed by:   Lanier Prude, CMA(AAMA) on 02/06/2010   Method used:   Telephoned to ...       [facility]  [address] (retail)       [address]       [place], [place]  [address]       Ph: [phone] or [phone]       Fax: [phone]   RxID:   [id number]

## 2010-08-04 NOTE — Progress Notes (Signed)
Summary: Nuc. Pre-Procedure  Phone Note Outgoing Call Call back at [facility] Phone [phone]   Call placed by: Irean Hong, RN,  September 04, 2009 3:21 PM Summary of Call: Reviewed information on Myoview Information Sheet (see scanned document for further details).  Spoke with patient.     Nuclear Med Background Indications for Stress Test: Evaluation for Ischemia, PTCA Patency   History: Angioplasty, COPD, Heart Catheterization, Myocardial Infarction, Myocardial Perfusion Study  History Comments: '02 Subendocardial MI>PTCA RCA. 1/03 Cath:No restenosis RCA, N/O CAD(RCA,CFX). 4/03 MPS: EF=61%, (-) Ischemia, (-) scar.     Nuclear Pre-Procedure Cardiac Risk Factors: Family History - CAD, Lipids, Smoker Height (in): 72

## 2010-08-04 NOTE — Assessment & Plan Note (Signed)
Summary: HEAD AND CHEST COLD/ AVP'S PT/NWS   Vital Signs:  Patient profile:   60 year old male Height:      72 inches Weight:      169.38 pounds BMI:     23.06 O2 Sat:      98 % on Room air Temp:     98.8 degrees F oral Pulse rate:   100 / minute BP sitting:   110 / 66  (left arm)  Vitals Entered By: Lucious Groves (August 26, 2009 1:13 PM)  O2 Flow:  Room air CC: C/O head and chest cold, doing somewhat better. Congested, but is producing clear mucous./kb Is Patient Diabetic? No Pain Assessment Patient in pain? no        Primary Care Provider:  Georgina Quint Plotnikov MD  CC:  C/O head and chest cold, doing somewhat better. Congested, and but is producing clear mucous./kb.  History of Present Illness: here today with complaint of cough and chest congestion  . onset of symptoms was 48h ago. course has been sudden onset and now occurs in progressive pattern. problem precipitated by +sick contacts symptom characterized as nasal congestion and chest tightness problem associated with green sputum last night (now yellow/clear) and drenching sweat/fever but not associated with HA, ST or CP - no SOB or DOE. symptoms improved by nothing. symptoms worsened with exertion due to fatigue. no prior hx of same symptoms.   wants to review labs from 07/29/09    Preventive Screening-Counseling & Management  Alcohol-Tobacco     Smoking Cessation Counseling: yes  Clinical Review Panels:  Lipid Management   Cholesterol:  227 (07/29/2009)   LDL (bad choesterol):  DEL (07/11/2007)   HDL (good cholesterol):  75.00 (07/29/2009)  CBC   WBC:  6.6 (07/29/2009)   RBC:  5.20 (07/29/2009)   Hgb:  14.1 (07/29/2009)   Hct:  43.6 (07/29/2009)   Platelets:  176.0 (07/29/2009)   MCV  83.8 (07/29/2009)   MCHC  32.4 (07/29/2009)   RDW  16.2 (07/29/2009)   PMN:  57.8 (07/29/2009)   Lymphs:  29.0 (07/29/2009)   Monos:  10.5 (07/29/2009)   Eosinophils:  1.5 (07/29/2009)   Basophil:  1.2  (07/29/2009)  Complete Metabolic Panel   Glucose:  83 (07/29/2009)   Sodium:  139 (07/29/2009)   Potassium:  3.9 (07/29/2009)   Chloride:  103 (07/29/2009)   CO2:  28 (07/29/2009)   BUN:  8 (07/29/2009)   Creatinine:  0.9 (07/29/2009)   Albumin:  3.9 (07/29/2009)   Total Protein:  7.9 (07/29/2009)   Calcium:  9.3 (07/29/2009)   Total Bili:  0.4 (07/29/2009)   Alk Phos:  63 (07/29/2009)   SGPT (ALT):  30 (07/29/2009)   SGOT (AST):  35 (07/29/2009)   Current Medications (verified): 1)  Adult Aspirin Low Strength 81 Mg  Tbdp (Aspirin) .... Take 1 By Mouth Qd 2)  Levitra 20 Mg Tabs (Vardenafil Hcl) .... 1/2-1 Once Daily As Needed 3)  Cymbalta 30 Mg Cpep (Duloxetine Hcl) .[name].. 1 By Mouth Qd 4)  Diazepam 10 Mg Tabs (Diazepam) .... 1/2 or 1 By Mouth Two Times A Day As Needed  Allergies (verified): 1)  Lipitor  Past History:  Past medical, surgical, family and social histories (including risk factors) reviewed, and no changes noted (except as noted below).  Past Medical History: Reviewed history from 07/07/2009 and no changes required. 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 EGD (10/06/2001) Cardiac Catherization (07/14/2001) Stress Cardiolite (06/30/2001)  Family History: Reviewed history from 04/10/2007 and no changes required. brother deceased - cancer  Social History: Reviewed history from 07/07/2009 and no changes required. Former Smoker 1 ppd Occupation: unempl. 2010 Divorced Current Smoker Regular exercise-no  Review of Systems  The patient denies anorexia, headaches, and hemoptysis.         see HPI above. I have reviewed all other systems and they were negative.   Physical Exam  General:  alert, well-developed, well-nourished, and cooperative to  examination.   mildly ill - smells of smoke Eyes:  vision grossly intact; pupils equal, round and reactive to light.  conjunctiva and lids normal.    Ears:  mod cerumen bilaterally but no erythema or effusion appreciated Nose:  External nasal examination shows no deformity or inflammation. Nasal mucosa are pink and moist without lesions or exudates. Mouth:  teeth and gums in good repair; mucous membranes moist, without lesions or ulcers. oropharynx clear without exudate, mod erythema. +yellow green PND Lungs:  Normal respiratory effort, chest expands symmetrically. Lungs are clear to auscultation, no crackles or wheezes. Heart:  Normal rate and regular rhythm. S1 and S2 normal without gallop, murmur, click, rub or other extra sounds.   Impression & Recommendations:  Problem # 1:  ACUTE BRONCHITIS (ICD-466.0)  The following medications were removed from the medication list:    Symbicort 160-4.5 Mcg/act Aero (Budesonide-formoterol fumarate) .[name]... 2 puffs two times a day His updated medication list for this problem includes:    Azithromycin 250 Mg Tabs (Azithromycin) .[name]... 2 tabs by mouth today, then 1 by mouth daily starting tomorrow  Take antibiotics and other medications as directed. Encouraged to push clear liquids, get enough rest, and take acetaminophen as needed. To be seen in 5-7 days if no improvement, sooner if worse.  Orders: Prescription Created Electronically [phone]) Tobacco use cessation intermediate 3-10 minutes ([address])  Problem # 2:  SMOKER (ICD-305.1)  5 minutes today spent on patient education regarding the unhealthy effects of continued tobacco abuse and encouragment of cessation including medical options available to help patient to quit smoking.   Orders: Tobacco use cessation intermediate 3-10 minutes ([address])  Problem # 3:  HYPERLIPIDEMIA (ICD-272.4) stopped statin due to hand cramps and muscle aches - advided fish oil until f/u with PCP Labs Reviewed: SGOT: 35  (07/29/2009)   SGPT: 30 (07/29/2009)   HDL:75.00 (07/29/2009), 68.4 (07/11/2007)  LDL:DEL (07/11/2007), 65 (60/45/4098)  Chol:227 (07/29/2009), 192 (07/11/2007)  Trig:443.0 (07/29/2009), 209 (07/11/2007)  Complete Medication List: 1)  Adult Aspirin Low Strength 81 Mg Tbdp (Aspirin) .... Take 1 by mouth qd 2)  Levitra 20 Mg Tabs (Vardenafil hcl) .... 1/2-1 once daily as needed 3)  Cymbalta 30 Mg Cpep (Duloxetine hcl) .[name].. 1 by mouth qd 4)  Diazepam 10 Mg Tabs (Diazepam) .... 1/2 or 1 by mouth two times a day as needed 5)  Azithromycin 250 Mg Tabs (Azithromycin) .... 2 tabs by mouth today, then 1 by mouth daily starting tomorrow 6)  Fish Oil 1000 Mg Caps (Omega-3 fatty acids) .[name].. 1 by mouth two times a day  Patient Instructions: 1)  it was good to see you today.  2)  antibiotics - Zpack - your prescription have been electronically submitted to your pharmacy. Please take as directed. Contact our office if you believe you're having problems with the medication(s).  3)  take fish oil two times a day  for your cholesterol until you see Dr. Posey Rea to further discuss your cholesterol 4)  Acute bronchitis : take over the counter cough medications like Mucinex or Robitussin. also Tylenol for aches - call if no improvement in  5-7 days, sooner if increasing cough, fever, or new symptoms( shortness of breath, chest pain). 5)  Tobacco is very bad for your health and your loved ones! You Should stop smoking!. 6)  samples of cymbalta as requested x 2 weeks Prescriptions: AZITHROMYCIN 250 MG TABS (AZITHROMYCIN) 2 tabs by mouth today, then 1 by mouth daily starting tomorrow  #6 x 0   Entered and Authorized by:   Newt Lukes MD   Signed by:   Newt Lukes MD on 08/26/2009   Method used:   Electronically to        [facility] [id number] (retail)       [address] [place], [place]  [address]       Ph: [phone]       Fax: [phone]   RxID:   [id number]

## 2010-08-04 NOTE — Assessment & Plan Note (Signed)
Summary: saw dr vl 08/26/09-cholesterol high-stc   Vital Signs:  Patient profile:   60 year old male Weight:      168 pounds Temp:     99.4 degrees F oral Pulse rate:   104 / minute BP sitting:   126 / 72  (left arm)  Vitals Entered By: Tora Perches (August 29, 2009 10:02 AM) CC: elevated cholesterol Is Patient Diabetic? No   Primary Care Provider:  Tresa Garter MD  CC:  elevated cholesterol.  History of Present Illness: F/u hyperlipidemia, CAD, anxiety The patient presents with complaints of sore throat, fever, cough, sinus congestion and drainge of several days duration. Not better with OTC meds. Chest hurts with coughing.  The mucus is colored.   Preventive Screening-Counseling & Management  Alcohol-Tobacco     Smoking Status: current  Current Medications (verified): 1)  Adult Aspirin Low Strength 81 Mg  Tbdp (Aspirin) .... Take 1 By Mouth Qd 2)  Levitra 20 Mg Tabs (Vardenafil Hcl) .... 1/2-1 Once Daily As Needed 3)  Cymbalta 30 Mg Cpep (Duloxetine Hcl) .[name].. 1 By Mouth Qd 4)  Diazepam 10 Mg Tabs (Diazepam) .... 1/2 or 1 By Mouth Two Times A Day As Needed 5)  Azithromycin 250 Mg Tabs (Azithromycin) .... 2 Tabs By Mouth Today, Then 1 By Mouth Daily Starting Tomorrow 6)  Fish Oil 1000 Mg Caps (Omega-3 Fatty Acids) .[name].. 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 2010 Divorced Current Smoker Regular exercise-no  Social History: Reviewed history from 07/07/2009 and no changes required. Former Smoker 1 ppd Occupation: unempl. 2010 Divorced Current Smoker Regular exercise-no  Review of Systems  The patient denies fever, chest pain, and  abdominal pain.     Impression & Recommendations:  Problem # 1:  HYPERLIPIDEMIA (ICD-272.4) Assessment Deteriorated  His updated medication list for this problem includes:    Tricor 145 Mg Tabs (Fenofibrate) .[name]... 1 by mouth once daily for high triglycerides  Orders: Cardiolite (Cardiolite)  Problem # 2:  SINUSITIS- ACUTE-NOS (ICD-461.9) Assessment: Improved  Problem # 3:  CORONARY ARTERY DISEASE (ICD-414.00)  His updated medication list for this problem includes:    Adult Aspirin Low Strength 81 Mg Tbdp (Aspirin) .[name]... Take 1 by mouth qd  Orders: Cardiolite (Cardiolite)  Problem # 4:  SMOKER (ICD-305.1) Assessment: Unchanged  Encouraged smoking cessation and discussed different methods for smoking cessation.   Complete Medication List: 1)  Adult Aspirin Low Strength 81 Mg Tbdp (Aspirin) .... Take 1 by mouth qd 2)  Levitra 20 Mg Tabs (Vardenafil hcl) .... 1/2-1 once daily as needed 3)  Cymbalta 30 Mg Cpep (Duloxetine hcl) .[name].. 1 by mouth qd 4)  Diazepam 10 Mg Tabs (Diazepam) .... 1/2 or 1 by mouth two times a day as needed 5)  Fish Oil 1000 Mg Caps (Omega-3 fatty acids) .[name].. 1 by mouth two times a day 6)  Tricor 145 Mg Tabs (Fenofibrate) .[name].. 1 by mouth once daily for high triglycerides  Patient Instructions: 1)  Please schedule a follow-up appointment in 3 months. 2)  BMP prior to visit, ICD-9: 3)  Hepatic Panel prior to visit, ICD-9: 4)  Lipid Panel prior to visit, ICD-9: 272.20  995.20 Prescriptions: TRICOR 145 MG TABS (FENOFIBRATE) 1  by mouth once daily for high triglycerides  #30 x 12   Entered and Authorized by:   Tresa Garter MD   Signed by:   Tresa Garter MD on 08/29/2009   Method used:   Print then Give to Patient   RxID:   [id number] AZITHROMYCIN 250 MG TABS (AZITHROMYCIN) 2 tabs by mouth today, then 1 by mouth daily starting tomorrow  #6 x 0   Entered and Authorized by:   Tresa Garter MD   Signed by:   Tresa Garter MD on  08/29/2009   Method used:   Print then Give to Patient   RxID:   [id number]

## 2010-08-04 NOTE — Assessment & Plan Note (Signed)
Summary: Cardiology Nuclear Study  Nuclear Med Background Indications for Stress Test: Evaluation for Ischemia, PTCA Patency   History: Angioplasty, COPD, Heart Catheterization, Myocardial Infarction, Myocardial Perfusion Study  History Comments: '02 Subendocardial MI>PTCA RCA. 1/03 Cath:No restenosis RCA, N/O CAD(RCA,CFX). 4/03 MPS: EF=61%, (-) Ischemia, (-) scar.  Symptoms: DOE    Nuclear Pre-Procedure Cardiac Risk Factors: Family History - CAD, Lipids, Smoker Caffeine/Decaff Intake: None NPO After: 7:30 PM Lungs: Clear IV 0.9% NS with Angio Cath: 22g     IV Site: (R) AC IV Started by: Irean Hong RN Chest Size (in) 44     Height (in): 70 Weight (lb): 170 BMI: 24.48  Nuclear Med Study 1 or 2 day study:  1 day     Stress Test Type:  Eugenie Birks Reading MD:  Dietrich Pates, MD     Referring MD:  A.Plotnikov Resting Radionuclide:  Technetium 94m Tetrofosmin     Resting Radionuclide Dose:  11.0 mCi  Stress Radionuclide:  Technetium 37m Tetrofosmin     Stress Radionuclide Dose:  33.0 mCi   Stress Protocol   Lexiscan: 0.4 mg        Nuclear Technologist:  Burna Mortimer Deal RT-N  Rest Procedure  Myocardial perfusion imaging was performed at rest 45 minutes following the intravenous administration of Myoview Technetium 48m Tetrofosmin.  Stress Procedure  The patient received IV Lexiscan 0.4 mg over 15-seconds.  Myoview injected at 30-seconds.  There were no significant changes with infusion.  Quantitative spect images were obtained after a 45 minute delay.  QPS Raw Data Images:  Soft tissue attenuation (bowel activity, diaphragm) underlie heart. Stress Images:  There is normal uptake in all areas. Rest Images:  Normal homogeneous uptake in all areas of the myocardium. Subtraction (SDS):  No evidence of ischemia. Transient Ischemic Dilatation:  1.10  (Normal <1.22)  Lung/Heart Ratio:  .30  (Normal <0.45)  Quantitative Gated Spect Images QGS EDV:  112 ml QGS ESV:  42 ml QGS EF:   63 %   Overall Impression  Exercise Capacity: Lexisan study. BP Response: Normal blood pressure response. Clinical Symptoms: No chest pain ECG Impression: No significant ST segment change suggestive of ischemia. Overall Impression: Normal stress nuclear study.  Appended Document: Cardiology Nuclear Sharmon Leyden, please, inform -stress test was OK   Thanks, AP   Appended Document: Cardiology Nuclear Study Patient notified.

## 2010-08-06 ENCOUNTER — Other Ambulatory Visit: Payer: BC Managed Care – PPO

## 2010-08-06 ENCOUNTER — Encounter: Payer: Self-pay | Admitting: Internal Medicine

## 2010-08-06 ENCOUNTER — Ambulatory Visit (INDEPENDENT_AMBULATORY_CARE_PROVIDER_SITE_OTHER): Payer: BC Managed Care – PPO | Admitting: Internal Medicine

## 2010-08-06 ENCOUNTER — Encounter (INDEPENDENT_AMBULATORY_CARE_PROVIDER_SITE_OTHER): Payer: Self-pay | Admitting: *Deleted

## 2010-08-06 DIAGNOSIS — I2510 Atherosclerotic heart disease of native coronary artery without angina pectoris: Secondary | ICD-10-CM

## 2010-08-06 DIAGNOSIS — N529 Male erectile dysfunction, unspecified: Secondary | ICD-10-CM

## 2010-08-06 DIAGNOSIS — E559 Vitamin D deficiency, unspecified: Secondary | ICD-10-CM

## 2010-08-06 DIAGNOSIS — E785 Hyperlipidemia, unspecified: Secondary | ICD-10-CM

## 2010-08-06 DIAGNOSIS — F17200 Nicotine dependence, unspecified, uncomplicated: Secondary | ICD-10-CM

## 2010-08-10 ENCOUNTER — Telehealth: Payer: Self-pay | Admitting: Internal Medicine

## 2010-08-10 LAB — CONVERTED CEMR LAB: Vit D, 25-Hydroxy: 22 ng/mL — ABNORMAL LOW (ref 30–89)

## 2010-08-12 NOTE — Assessment & Plan Note (Signed)
Summary: 3 MTH FU--STC   Vital Signs:  Patient profile:   60 year old male Height:      70 inches Weight:      171 pounds BMI:     24.62 Temp:     97.8 degrees F oral Pulse rate:   80 / minute Pulse rhythm:   regular Resp:     16 per minute BP sitting:   130 / 80  (left arm) Cuff size:   regular  Vitals Entered By: Lanier Prude, CMA(AAMA) (August 06, 2010 8:07 AM) CC: 3 mo f/u  Is Patient Diabetic? No   Primary Care Provider:  Tresa Garter MD  CC:  3 mo f/u .  History of Present Illness: The patient presents for a follow up of OA back pain, anxiety, depression and dyslipidemia.  The patient presents with complaints of sore throat, fever, cough, sinus congestion and drainge of several days duration. Not better with OTC meds.   The mucus is colored.   Current Medications (verified): 1)  Adult Aspirin Low Strength 81 Mg  Tbdp (Aspirin) .... Take 1 By Mouth Qd 2)  Levitra 20 Mg Tabs (Vardenafil Hcl) .... 1/2-1 Once Daily As Needed 3)  Diazepam 10 Mg Tabs (Diazepam) .... 1/2 or 1 By Mouth Two Times A Day As Needed 4)  Fish Oil 1000 Mg Caps (Omega-3 Fatty Acids) .[name].. 1 By Mouth Two Times A Day 5)  Loratadine 10 Mg Tabs (Loratadine) .[name].. 1 By Mouth Once Daily As Needed Allergies 6)  Lopid 600 Mg Tabs (Gemfibrozil) .[name].. 1 By Mouth Qd 7)  Vitamin D 1000 Unit Tabs (Cholecalciferol) .[name].. 1 By Mouth Qd  Allergies (verified): 1)  Lipitor  Social History: Former Smoker 2 ppd Occupation: unempl. 2010 Divorced Current Smoker Regular exercise-no  Review of Systems  The patient denies fever, dyspnea on exertion, abdominal pain, and depression.    Physical Exam  General:  alert, well-developed, well-nourished, and cooperative to examination, he smells of smoke Ears:  External ear exam shows no significant lesions or deformities.  Otoscopic examination reveals clear canals, tympanic membranes are intact bilaterally without bulging, retraction, inflammation or discharge.  Hearing is grossly normal bilaterally. Nose:  Erythematous throat and intranasal mucosa c/w URI  Mouth:  teeth and gums in good repair; mucous membranes moist, without lesions or ulcers. oropharynx clear without exudate, mod erythema. +yellow green PND Lungs:  Normal respiratory effort, chest expands symmetrically. Lungs are clear to auscultation, no crackles or wheezes. Heart:  Normal rate and regular rhythm. S1 and S2 normal without gallop, murmur, click, rub or other extra sounds. Abdomen:  Bowel sounds positive,abdomen soft and non-tender without masses, organomegaly or hernias noted. Msk:  No deformity or scoliosis noted of thoracic or lumbar spine.   Neurologic:  alert & oriented X3.     Impression & Recommendations:  Problem # 1:  ANXIETY (ICD-300.00) Assessment Unchanged  His updated medication list for this problem includes:    Diazepam 10 Mg Tabs (Diazepam) .[name]... 1/2 or 1 by mouth two times a day as needed Risks vs benefits and controversies of a long term controlled substances use were discussed.   Problem # 2:  ERECTILE DYSFUNCTION, ORGANIC (ICD-607.84) Assessment: Unchanged Use condoms! The following medications were removed from the medication list:    Levitra 20 Mg Tabs (Vardenafil hcl) .[name]... 1/2-1 once daily as needed His updated medication list for this problem includes:    Viagra 100 Mg Tabs (Sildenafil citrate) .[name]... 1 by mouth once  daily prn  Problem # 3:  OSTEOARTHRITIS (ICD-715.90) Assessment: Unchanged  His updated medication list for this problem includes:    Adult Aspirin Low Strength 81 Mg Tbdp (Aspirin) .[name]... Take 1 by mouth qd  Problem # 4:  HYPERLIPIDEMIA (ICD-272.4) Assessment: Unchanged  His updated medication list for this problem includes:    Lopid 600 Mg Tabs (Gemfibrozil) .[name]... 1 by mouth qd  Problem # 5:  SMOKER (ICD-305.1) Assessment: Deteriorated  Encouraged smoking cessation and discussed different methods for smoking cessation.    Problem # 6:  CORONARY ARTERY DISEASE (ICD-414.00) Assessment: Unchanged  His updated medication list for this problem includes:    Adult Aspirin Low Strength 81 Mg Tbdp (Aspirin) .[name]... Take 1 by mouth qd Laabs is pending  Labs Reviewed: Chol: 210 (11/28/2009)   HDL: 79.50 (11/28/2009)   LDL: DEL (07/11/2007)   TG: 350.0 (11/28/2009)  Problem # 7:  ACUTE BRONCHITIS (ICD-466.0) Assessment: New  His updated medication list for this problem includes:    Amoxicillin 500 Mg Caps (Amoxicillin) .[name]... 2 caps by mouth bid  Complete Medication List: 1)  Adult Aspirin Low Strength 81 Mg Tbdp (Aspirin) .... Take 1 by mouth qd 2)  Diazepam 10 Mg Tabs (Diazepam) .... 1/2 or 1 by mouth two times a day as needed 3)  Fish Oil 1000 Mg Caps (Omega-3 fatty acids) .[name].. 1 by mouth two times a day 4)  Loratadine 10 Mg Tabs (Loratadine) .[name].. 1 by mouth once daily as needed allergies 5)  Lopid 600 Mg Tabs (Gemfibrozil) .[name].. 1 by mouth qd 6)  Vitamin D 1000 Unit Tabs (Cholecalciferol) .[name].. 1 by mouth qd 7)  Viagra 100 Mg Tabs (Sildenafil citrate) .[name].. 1 by mouth once daily prn 8)  Amoxicillin 500 Mg Caps (Amoxicillin) .... 2 caps by mouth bid  Patient Instructions: 1)  Please schedule a follow-up appointment in 3 months. Prescriptions: AMOXICILLIN 500 MG CAPS (AMOXICILLIN) 2 caps by mouth bid  #40 x 0   Entered and Authorized by:   Tresa Garter MD   Signed by:   Tresa Garter MD on 08/06/2010   Method used:   Print then Give to Patient   RxID:   [id number] DIAZEPAM 10 MG TABS (DIAZEPAM) 1/2 or 1 by mouth two times a day as needed  #60 x 2   Entered and Authorized by:   Tresa Garter MD   Signed by:   Tresa Garter MD on 08/06/2010   Method used:   Print then Give to Patient   RxID:   [id number] VIAGRA 100 MG TABS (SILDENAFIL CITRATE) 1 by mouth once daily prn  #12 x 6   Entered and Authorized by:   Tresa Garter MD   Signed by:   Tresa Garter MD on  08/06/2010   Method used:   Print then Give to Patient   RxID:   [id number]    Orders Added: 1)  Est. Patient Level IV [53664]

## 2010-08-14 ENCOUNTER — Telehealth: Payer: Self-pay | Admitting: Internal Medicine

## 2010-08-20 NOTE — Progress Notes (Signed)
Summary: RESULTS  Phone Note Call from Patient   Summary of Call: Patient is requesting a call back - was too sleepy when he got the call w/results and wants to be told again.  Initial call taken by: Lamar Sprinkles, CMA,  August 10, 2010 2:31 PM  Follow-up for Phone Call        Pt informed  Follow-up by: Lamar Sprinkles, CMA,  August 10, 2010 4:48 PM    New/Updated Medications: VITAMIN D 1000 UNIT TABS (CHOLECALCIFEROL) 2 by mouth qd

## 2010-08-20 NOTE — Progress Notes (Signed)
Summary: RX?  Phone Note Call from Patient   Summary of Call: Patient is requesting rx for zpak. Ammoxicillin has not helped with symptoms.  Initial call taken by: Lamar Sprinkles, CMA,  August 14, 2010 11:42 AM  Follow-up for Phone Call        Needs ov sat clinic Follow-up by: Tresa Garter MD,  August 14, 2010 1:25 PM  Additional Follow-up for Phone Call Additional follow up Details #1::        Pt has no insurance and can not afford office visit at this time. He has fever and sweats off and on. Cough w/productive w/clear mucus. Any further suggestions?  Additional Follow-up by: Lamar Sprinkles, CMA,  August 14, 2010 4:57 PM    Additional Follow-up for Phone Call Additional follow up Details #2::    ok zpac Follow-up by: Tresa Garter MD,  August 14, 2010 6:17 PM  Additional Follow-up for Phone Call Additional follow up Details #3:: Details for Additional Follow-up Action Taken: left detailed vm on pt's home # Additional Follow-up by: Lamar Sprinkles, CMA,  August 14, 2010 6:32 PM  New/Updated Medications: ZITHROMAX Z-PAK 250 MG TABS (AZITHROMYCIN) as dirrected Prescriptions: ZITHROMAX Z-PAK 250 MG TABS (AZITHROMYCIN) as dirrected  #1 x 0   Entered by:   Lamar Sprinkles, CMA   Authorized by:   Tresa Garter MD   Signed by:   Lamar Sprinkles, CMA on 08/14/2010   Method used:   [facility] to        [facility]  [address] (retail)       [address]       [place], [place]  [address]       Ph: [phone] or [phone]       Fax: [phone]   RxID:   [id number]

## 2010-08-31 ENCOUNTER — Ambulatory Visit: Payer: Self-pay | Admitting: Internal Medicine

## 2010-09-17 ENCOUNTER — Encounter: Payer: Self-pay | Admitting: Internal Medicine

## 2010-09-22 NOTE — Miscellaneous (Signed)
Summary: Gemfibrozil  Clinical Lists Changes  Medications: Changed medication from LOPID 600 MG TABS (GEMFIBROZIL) 1 by mouth qd to GEMFIBROZIL 600 MG TABS (GEMFIBROZIL) 1 by mouth once daily - Signed Rx of GEMFIBROZIL 600 MG TABS (GEMFIBROZIL) 1 by mouth once daily;  #30 x 5;  Signed;  Entered by: Lanier Prude, CMA(AAMA);  Authorized by: Tresa Garter MD;  Method used: [facility]  [address] [place], [place]  [address], Ph: [phone] or [phone], Fax: [phone]    Prescriptions: GEMFIBROZIL 600 MG TABS (GEMFIBROZIL) 1 by mouth once daily  #30 x 5   Entered by:   Lanier Prude, [facility])   Authorized by:   Tresa Garter MD   Signed by:   Lanier Prude, CMA(AAMA) on 09/17/2010   Method used:   [facility]        [facility]  [address] (retail)       [address]       [place], [place]  [address]       Ph: [phone] or [phone]       Fax: [phone]   RxID:   [id number]

## 2010-10-29 ENCOUNTER — Telehealth: Payer: Self-pay

## 2010-10-29 NOTE — Telephone Encounter (Signed)
Pt called requesting a chest X-Ray. Pt did not a give reason

## 2010-10-29 NOTE — Telephone Encounter (Signed)
Left mess to call office back.   

## 2010-10-30 NOTE — Telephone Encounter (Signed)
Returned call to patient and asked reason for xray. He states that he is a smoker and ts just been a lot of years since he last had one. He now has insurance and would like to have one done.

## 2010-11-04 NOTE — Telephone Encounter (Signed)
Sch. OV please. Thx

## 2010-11-05 NOTE — Telephone Encounter (Signed)
appt already scheduled 11-10-10

## 2010-11-09 ENCOUNTER — Encounter: Payer: Self-pay | Admitting: Internal Medicine

## 2010-11-10 ENCOUNTER — Encounter: Payer: Self-pay | Admitting: Internal Medicine

## 2010-11-10 ENCOUNTER — Telehealth: Payer: Self-pay | Admitting: Internal Medicine

## 2010-11-10 ENCOUNTER — Ambulatory Visit (INDEPENDENT_AMBULATORY_CARE_PROVIDER_SITE_OTHER): Payer: BC Managed Care – PPO | Admitting: Internal Medicine

## 2010-11-10 ENCOUNTER — Ambulatory Visit (INDEPENDENT_AMBULATORY_CARE_PROVIDER_SITE_OTHER)
Admission: RE | Admit: 2010-11-10 | Discharge: 2010-11-10 | Disposition: A | Discharge status: Home / Self Care | Payer: BC Managed Care – PPO | Source: Ambulatory Visit | Attending: Internal Medicine | Admitting: Internal Medicine

## 2010-11-10 DIAGNOSIS — J449 Chronic obstructive pulmonary disease, unspecified: Secondary | ICD-10-CM

## 2010-11-10 DIAGNOSIS — R059 Cough, unspecified: Secondary | ICD-10-CM

## 2010-11-10 DIAGNOSIS — R05 Cough: Secondary | ICD-10-CM

## 2010-11-10 DIAGNOSIS — L723 Sebaceous cyst: Secondary | ICD-10-CM | POA: Insufficient documentation

## 2010-11-10 MED ORDER — DIAZEPAM 10 MG PO TABS: 5.0000 mg | ORAL_TABLET | Freq: Two times a day (BID) | ORAL | Status: DC | PRN

## 2010-11-10 NOTE — Assessment & Plan Note (Signed)
He cont. to smoke. Will get a CXR

## 2010-11-10 NOTE — Telephone Encounter (Signed)
[name], please, inform patient that his CXR has changes c/w emphysema and bronchitis

## 2010-11-10 NOTE — Progress Notes (Signed)
  Subjective:    Patient ID: [name], male    DOB: Feb 20, 1951, 60 y.o.   MRN: [id number]  HPI  C/o cough Smoking 2 ppd C/o cys w/pain on L elbow wants it off... C/o ED F/u anxiety  Review of Systems  Constitutional: Negative for fatigue.  HENT: Positive for hearing loss.   Respiratory: Positive for cough and shortness of breath.   Cardiovascular: Negative for leg swelling.  Gastrointestinal: Negative for nausea.  Genitourinary: Positive for frequency.  Musculoskeletal: Positive for back pain.  Neurological: Negative for weakness.  Psychiatric/Behavioral: Negative for suicidal ideas and confusion.       Objective:   Physical Exam  Constitutional: He is oriented to person, place, and time. He appears well-developed.  HENT:  Mouth/Throat: Oropharynx is clear and moist.  Eyes: Conjunctivae are normal. Pupils are equal, round, and reactive to light.  Neck: Normal range of motion. No JVD present. No thyromegaly present.  Cardiovascular: Normal rate, regular rhythm, normal heart sounds and intact distal pulses.  Exam reveals no gallop and no friction rub.   No murmur heard. Pulmonary/Chest: Effort normal and breath sounds normal. No respiratory distress. He has no wheezes. He has no rales. He exhibits no tenderness.  Abdominal: Soft. Bowel sounds are normal. He exhibits no distension and no mass. There is no tenderness. There is no rebound and no guarding.  Musculoskeletal: Normal range of motion. He exhibits no edema and no tenderness.  Lymphadenopathy:    He has no cervical adenopathy.  Neurological: He is alert and oriented to person, place, and time. He has normal reflexes. No cranial nerve deficit. He exhibits normal muscle tone. Coordination normal.  Skin: Skin is warm and dry. No rash noted.       L elbow 1 cm sq cyst, mobile  Psychiatric: He has a normal mood and affect. His behavior is normal. Judgment and thought content normal.          Assessment & Plan:    COUGH He cont. to smoke. Will get a CXR  Sebaceous cyst It is painful. He wants me to take it off.     Anxiety - Diazepam prn  Tobacco- 2ppd - needs to quit

## 2010-11-10 NOTE — Assessment & Plan Note (Signed)
It is painful. He wants me to take it off.

## 2010-11-11 NOTE — Telephone Encounter (Signed)
Pt informed. Does he need any further med changes?

## 2010-11-11 NOTE — Telephone Encounter (Signed)
He needs to smoke less or to stop smoking. No new meds Thx

## 2010-11-12 NOTE — Telephone Encounter (Signed)
Pt informed

## 2010-11-17 NOTE — Consult Note (Signed)
[facility]                          ENDOCRINOLOGY CONSULTATION   Brent, [name]                         MRN:          [id number]  DATE:12/19/2006                            DOB:          14-Feb-1951    REFERRING PHYSICIAN:  Georgina Quint. Plotnikov, MD   REASON FOR REFERRAL:  Abnormal parathyroid hormone.   HISTORY OF PRESENT ILLNESS:  This is a 60 year old man who has had  several years of pain in both hands and in all ten fingers.   On evaluation of his symptoms, he was found to have a slightly low  vitamin D level as well as a low parathyroid hormone level.   The patient also reports many, many believed fractures over the years  including his left second toe, his mandible, his right wrist, and his  right clavicle.   PAST MEDICAL HISTORY:  1. Cigarette smoker.  2. Anxiety.  3. Dyslipidemia.   He has never had neck surgery.  There is no history of urolithiasis.   MEDICATIONS:  He was recently started on vitamin D supplement, weekly.   SOCIAL HISTORY:  He is single and works in [employer].   FAMILY HISTORY:  Negative for parathyroid disease or Padgett's disease.   REVIEW OF SYSTEMS:  Denies muscle cramps, denies loss of consciousness.   PHYSICAL EXAMINATION:  VITAL SIGNS:  Blood pressure 122/75, heart rate  90, temperature 97.7.  The weight is 176.  GENERAL:  No distress.  SKIN:  No rash, not diaphoretic.  HEENT:  No proptosis. No periorbital swelling.  Pharynx is normal.  NECK:  No goiter.  EXTREMITIES:  On the hands, I see mild changes of osteoarthritis but no  other deformity.  NEUROLOGIC:  Alert and oriented.  Does not appear anxious nor depressed.   LABORATORY STUDIES:  Alkaline phosphatase normal at 55.  Calcium 8.9.  TSH 0.76.  Parathyroid hormone slightly low at 10.2  picograms/milliliter, 25-hydroxy vitamin D slightly low at 14  nanograms/milliliter.   IMPRESSION:  1. History of multiple bony fractures,  apparently with very mild      trauma. Osteoporosis is a consideration.  2. The normal alkaline phosphatase makes Padgett's disease unlikely.  3. Mild hypoparathyroidism.  4. Mild hypovitaminosis-D.  The fact that his PTH level is slightly      low with a slightly low vitamin D has one of two possible causes.      One is that his low vitamin D is a compensatory response to his      mild hypoparathyroidism.  The other is that he has two diseases.      One would be vitamin D deficiency, and the other one would be      hypoparathyroidism.   PLAN:  1. I told the patient it may take a few visits to get these      possibilities teased out.  2. Check bone density.  3. For now, discontinue vitamin D supplements.  4. Return in about 3 months with parathyroid hormone level prior.     Sean A. Everardo All, MD  Electronically Signed  SAE/MedQ  DD: 12/19/2006  DT: 12/19/2006  Job #: [id number]   cc:   [facility] office

## 2010-11-20 NOTE — Discharge Summary (Signed)
[place]  Patient:    [name], [name] Visit Number: [id number] MRN: [id number]          Service Type: MED Location: [phone] Attending Physician:  Glennon Hamilton Dictated by:   Joellyn Rued, P.A.-C. Admit Date:  10/04/2001 Discharge Date: 10/06/2001   CC:         [name], M.D. [facility]  [name], M.D. [name]   Referring Physician Discharge [place]  DATE OF BIRTH:  1951-01-23  SUMMARY OF HISTORY:  [name] is a 60 year old white male, patient of [name], who had a myocardial infarction in June 2002.  He was found to have a 90% RCA and underwent atherectomy, reducing the lesion to less than 10%. He had residual 40% circumflex and a 70% OM with an EF of 60%.  In December, he had an abnormal Cardiolite and underwent a cardiac catheterization in January.  Again, the 42% marginal branch was present without significant lesion with good LV function.  He had done well until the morning of presentation.  While at work, he stated he bent over to pick up a hose and had the sudden onset of severe substernal chest discomfort that radiated through to his back.  He described this as sharp and stabbing and an 8 on a scale of 1-10.  He took 3-4 nitroglycerin, and the discomfort resolved in about three hours.  He presented to [name] for further evaluation, although he was still having discomfort.  He stated that he also had some shortness of breath and diaphoresis as well as presyncope but no nausea.  He felt it was different than his myocardial infarction pain.  His history is also notable for alcohol and marijuana use, hyperlipidemia, remote tobacco.  LABORATORY DATA:  Serial three CK isoenzymes and two troponins were negative for myocardial infarction.  Admission sodium was 137, potassium 3.6, BUN 21, creatinine 1.1, glucose 119.  SGOT and SGPT were mildly elevated at 54 and 53. H&H were 11.4 and 33.2, normal indices, platelets 157, WBC  6.7.  Subsequent labs were unremarkable.  CT of the chest did not show evidence of pulmonary embolus.  CT of the lower extremities did not show any evidence of DVT.  EKG showed normal sinus rhythm, first-degree AV block, nonspecific ST-T wave changes.  [name] COURSE:  [name] was admitted to 97.  It was felt that his discomfort was very atypical, and a stress Cardiolite was performed.  This showed an EF of 61%, no ischemia or scarring.  CT of the chest and legs was performed to rule out pulmonary embolism, as previously described.  With negative findings, he continued to have chest discomfort, thus GI consultation was obtained.  They performed EGD on October 06, 2001, and this was essentially normal, and it was felt that he could be discharged home.  DISCHARGE DIAGNOSES: 1. Noncardiac chest discomfort, probable musculoskeletal in etiology. 2. History as previously.  DISPOSITION:  He is discharged home.  DISCHARGE MEDICATIONS: 1. He is asked to continue his aspirin 325 q.d. 2. Lipitor 40 mg q.h.s. 3. Valium 5 mg q.d. 4. Allegra 60 mg b.i.d. 5. Foltx q.d. 6. Sublingual nitroglycerin as needed. 7. Maintain a low salt, fat and cholesterol diet. 8. He is limited to 2 ounces of alcohol a day and asked not to do any drugs.  INSTRUCTIONS:  He was asked to call our office to arrange a follow-up appointment with [name] in 4-8 weeks. Dictated by:  Joellyn Rued, P.A.-C. Attending Physician:  Glennon Hamilton DD:  10/06/01 TD:  10/06/01 Job: [id number]

## 2010-11-20 NOTE — Op Note (Signed)
[place]  Patient:    [name]                       MRN: [id number] Proc. Date: 08/10/99 Adm. Date:  [id number] Attending:  Alinda Deem                           Operative Report  PREOPERATIVE DIAGNOSIS:  Left middle finger foreign body volarly just distal to the VIP joint.  POSTOPERATIVE DIAGNOSIS:  Left middle finger foreign body volarly just distal to the VIP joint.  PROCEDURE:  Excisional biopsy of deep foreign body, left middle finger.  SURGEON:  Alinda Deem, M.D.  ASSISTANT:  Dorthula Matas, P.A.-C.  ANESTHESIA:  Local with IV sedation.  ESTIMATED BLOOD LOSS:  Minimal.  FLUID REPLACEMENT:  500 cc or crystalloid.  DRAINS:  None.  TOURNIQUET TIME:  Four minutes.  INDICATIONS:  A 60 year old man who got a wooden splinter in his hand about a year ago and has had a persistent mass to the volar aspect of the DIP joint of the left middle finger ever since then.  He thinks there may be part of the wood still in there and on examination there is a firm, fixed mass in the skin and subcutaneous tissue of the left middle finger just distal to the DIP joint.  He desires excisional biopsy of same.  DESCRIPTION OF PROCEDURE:  The patient identified by armband, taken to the operating room at [facility].  Appropriate anesthetic monitors were attached and local anesthesia was induced to the left middle finger performing  digital block with a 50/50 mixture of 1% Xylocaine and 0.5% Marcaine.  The left  hand was then prepped and draped in the usual sterile fashion from the wrist distally and a small finger from a 6-1/2 glove was placed on the left middle finger as a tourniquet.  Using a #15 blade, we then ellipsed out the mass which was about 2 cm in diameter with a longitudinal ellipse going into the subcutaneous tissue. After the complete excision of the mass, the tourniquet was let down.   No significant bleeding was noted and the wound was washed out with normal saline solution.  The skin was then closed with 5-0 nylon suture, the length of the incision being about 5 or 6 mm.  A dressing of Xeroform, 2 x 2 dressing, sponges, one inch baby Curlex, and one inch Coban was applied.  The patient was then taken to the recovery room without difficulty. DD:  08/10/99 TD:  08/10/99 Job: [id number]

## 2010-11-20 NOTE — Cardiovascular Report (Signed)
[employer] [place]. [facility]  Patient:    [name], [name]                         MRN: [id number] Proc. Date: 12/13/00 Adm. Date:  [id number] Attending:  Lenoria Farrier CC:         Sonda Primes, M.D. [facility]  Cardiopulmonary Laboratory   Cardiac Catheterization  PROCEDURES PERFORMED:  Cardiac catheterization and directional coronary atherectomy.  CLINICAL HISTORY:  The patient is 60 years old and has no prior history of known heart disease, although his sister had bypass surgery.  He developed chest pain last night and came to [facility] Room this morning and his CK-MBs and troponins were positive.  His ECG was normal.  He was having persistent pain and was brought to [facility] urgently for catheterization and possible intervention.  DESCRIPTION OF PROCEDURE:  The procedure was performed via the right femoral artery using an arterial sheath and 6 French preformed coronary catheters.  A front wall arterial puncture was performed and Omnipaque contrast was used. At the completion of the diagnostic study, we made a decision to proceed with intervention on the right coronary artery.  The patient was given weight-adjusted heparin to prolong the ACT to greater than 200 seconds and was given double bolus Integrilin and infusion.  We decided to do a [facility] because the lesion was a branched stenotic lesion.  A temporary pacemaker was passed via the right femoral vein.  We used a [facility] guiding catheter which was especially made for the [facility].  This was an 60 [place]. We used a Flexiwire and a 3.0 - 3.4 Flexicut.  We passed the cutter to the mid right coronary artery right at the takeoff of a large acute marginal branch which supplied part of the inferior septum.  During the first run we performed one cut at 5 atmospheres, 4 cuts at 10 atmospheres, 5 cuts at 15 atmospheres then 2 cuts at 30 atmospheres.  We then removed the cutter and obtained a [facility] full of  atheromatous material.  We then recrossed the lesion and performed a total of 9 cuts at 40 atmospheres moving the cutter up and down the lesion which was longer than the cutter window and 3 cuts at 50 atmospheres.  Repeat diagnostic studies were then performed through the guiding catheter.  The patient tolerated the procedure well and left the laboratory in satisfactory condition.  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  Left anterior descending artery:  The left anterior descending artery gave rise to a diagonal branch, three septal perforators and a second diagonal branch and terminated before the apex.  This vessel was free of significant disease.  Right coronary artery:  The right coronary artery is a large vessel that gave rise to a small marginal branch, a large acute marginal branch and supplied part of the inferior septum and two very large posterolateral branches.  There was a 90% stenosis just after the acute marginal branch which was about 12-13 mm in length.  This involved the acute marginal branch which had about a 70% lesion just after the bifurcation.  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  There was a 40% proximal stenosis.  LEFT VENTRICULOGRAPHY:  The left ventriculogram performed in the RAO projection showed good wall motion with no areas of hypokinesis.  The estimated  EF was 60%.  LEFT VENTRICULOGRAM:  The left ventriculogram performed in the LAO projection showed hypokinesis of the inferolateral wall.  Following directional atherectomy of the mid right coronary lesion, stenosis improved from 90% to less than 10%.  There was no residual dissection seen. There was about 70% compromise of the ostium to the marginal branch and we elected not to treat this.  This had not changed significantly from the preathrectomy pictures.  CONCLUSIONS: 1. 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 2. Successful directional atherectomy of the mid right coronary branch    stenosis with improvement in percent diameter narrowing from 90% to    less than 10%.  DISPOSITION:  The patient was returned to the postangioplasty unit for further observation. DD:  12/13/00 TD:  12/13/00 Job: [id number]

## 2010-11-20 NOTE — H&P (Signed)
[name]. [facility]  Patient:    [name], AYYAD Visit Number: [id number] MRN: [id number]          Service Type: MED Location: [phone] Attending Physician:  Glennon Hamilton Dictated by:   Delton See, P.A. Admit Date:  10/04/2001 Discharge Date: 10/06/2001   CC:         Sonda Primes, M.D. [facility]   History and Physical  DATE OF BIRTH:  28-Nov-1950  BRIEF HISTORY:  This is a 60 year old cardiology patient of Dr. Raylene Miyamoto who had an MI in June 2002.  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  He underwent atherectomy, reducing the lesion to less than 10%.  He also was noted to have a 40% narrowing of the circumflex and a marginal branch that had a 70% stenosis.  He had good LV function at that time with an EF of 60%.  In December 2002 the patient had an abnormal Cardiolite.  He went on to have a repeat cardiac catheterization on July 14, 2001.  At that time the site for the previous RCA occlusion was widely patent.  There was a 40% acute marginal branch.  There were no other significant lesions.  Again, the patient had good LV function.  Since that time, the patient has done well until this morning.  While at work he bent over to pick up a hose and had the sudden onset of severe substernal chest pain that radiated through to his back.  He described the pain as being sharp and stabbing and 8 on a 1-10 scale.  He took nitroglycerin x3-4.  The pain resolved in approximately three hours.  He presented to [facility] for further evaluation.  He had 2-3/10 pain upon presentation.  With the chest pain this a.m. he also experienced shortness of breath, a clammy feeling, as well as presyncope, but no nausea.  He states that this pain was different from his pain associated with his MI.  He also noted that the pain significantly increased with inspiration.  PAST MEDICAL HISTORY:  Please see cardiac history  above.  As noted, he had a non-Q-wave MI and a PTCA of his RCA in June 2002.  He has a history of elevated lipids.  He has a tobacco history; he quit smoking three years ago. He smoked three packs per day for 30 years.  He also has a history of alcohol and marijuana use.  Apparently had some hypotension in the past and therefore was not treated with a beta blocker or ACE inhibitor.  ALLERGIES:  CODEINE.  He also states he is allergic to the sun - he develops severe edema when exposed to the sun for any significant amount of time.  CURRENT MEDICATIONS: 1. Lipitor 40 mg daily. 2. Aspirin 325 mg daily. 3. Valium 5 mg daily. 4. Allegra 60 mg b.i.d. 5. Foltx one daily.  SOCIAL HISTORY:  The patient is single.  He lives in [place].  He quit tobacco three years ago.  He smoked three packs per day for 30 years.  He drinks three to four alcoholic beverages per day.  He uses marijuana occasionally.  FAMILY HISTORY:  The patients father died from an MI at age 78.  His mother is alive and well at age 41.  His sister had coronary artery bypass graft surgery.  REVIEW OF SYSTEMS:  Was negative except for the following:  He recently had a viral  illness.  He has also had recent weight loss; the patient is concerned that he may have cancer.  He has a history of photophobia and severe sensitivity to the sun.  He has a history of anxiety and depression.  He has a history of arthralgias.  All other review of systems is negative.  PHYSICAL EXAMINATION:  GENERAL:  Reveals a pleasant 60 year old white male in no acute distress.  VITAL SIGNS:  Blood pressure 121/78, pulse 63 and regular.  HEENT:  Unremarkable.  NECK:  Reveals no bruits, no jugular venous distention.  HEART:  Reveals regular rate and rhythm without murmur.  LUNGS:  Reveal decreased breath sounds with some rhonchi.  ABDOMEN:  Flat, soft, nontender.  EXTREMITIES:  Reveal pulses to be intact without edema.  SKIN:  Warm and  dry.  NEUROLOGIC:  Unremarkable.  LABORATORY DATA:  Chest x-ray shows no active disease.  EKG shows sinus rhythm, rate 63 beats per minute with a first degree AV block without ischemic changes.  PTT 52, INR 1.1.  CBC revealed hemoglobin 11.4; hematocrit 33.2; wbcs 6.7; platelets 157,000.  Chemistries revealed BUN 21, creatinine 1.1, potassium 3.6, glucose 119, SGOT 84, SGPT 43.  Troponin I 0.01, CK-MB mildly elevated with CK 429, MB 12, index 3.0.  IMPRESSION: 1. Chest pain of uncertain etiology, although it does sound pleuritic. 2. History of non-Q-wave myocardial infarction with percutaneous transluminal    coronary angioplasty of the right coronary artery in June 2002. 3. Cardiac catheterization in January 2003 following an abnormal Cardiolite    revealing no significant coronary artery disease with good left    ventricular function. 4. Elevated lipids. 5. Remote tobacco history. 6. History of marijuana and alcohol use. 7. Abnormal Cardiolite in December 2002. 8. History of hypotension. 9. Mild anemia.  PLAN:  We will check a D-dimer.  We will also check a chest CT to rule out dissection and pulmonary embolus.  We will consider cardiac catheterization if the above studies are negative.  We will also supplement his potassium.Dictated by:   Delton See, P.A. Attending Physician:  Glennon Hamilton DD:  10/04/01 TD:  10/04/01 Job: [id number]

## 2010-11-20 NOTE — Cardiovascular Report (Signed)
[place]. [facility]  Patient:    CARLUS, STAY Visit Number: [id number] MRN: [id number]          Service Type: CAT Location: [facility] [id number] [id number] Attending Physician:  Lenoria Farrier Dictated by:   Everardo Beals Juanda Chance, M.D. [facility] Proc. Date: 07/14/01 Admit Date:  07/14/2001 Discharge Date: 07/14/2001   CC:         Sonda Primes, M.D. [facility]  Cardiac Catheterization Lab   Cardiac Catheterization  CLINICAL HISTORY:  Mr. Grob is 60 years old and had a non-Q-wave infarction in June of this year and underwent directional atherectomy of a branch stenosis involving the mid to distal right coronary artery at its bifurcation with a marginal branch which supplied part of the posterior descending branch.  He did well following that but on his follow-up scan, he had evidence of ischemia which was in an unusual distribution, not typical for the lesion that we worked on.  He also had no symptoms; however, because of the abnormal scan, we brought him in for a reevaluation.  DESCRIPTION OF PROCEDURE:  The procedure was performed via the right femoral artery using an arterial sheath and #6 [place] preformed coronary catheters.  A front wall arterial puncture was performed and Omnipaque contrast was used.  A distal aortogram was performed to rule out abdominal aortic aneurysm.  The right femoral artery was closed with Perclose at the end of the procedure. The patient tolerated the procedure well and left the laboratory in satisfactory condition.  RESULTS:  The aortic pressure was 120/62 with a mean of 82.  The left ventricular pressure was 120/11.  1. Left main coronary artery:  There was no left main coronary artery. 2. Left anterior descending artery:  The left anterior descending artery gave    rise to three diagonal branches and three septal perforators.  These and    the LAD proper were free of significant disease.  There was no left main    coronary artery because the  circumflex arose anomalously from the right    coronary artery. 3. The right coronary artery was a dominant vessel that gave rise to an acute    marginal branch which supplied the inferior septum and then two    posterolateral branches.  There was 0% stenosis at the [facility] site in the    mid to distal vessel and there was 40% ostial narrowing in an acute    marginal branch next to the previous atherectomy site. 4. 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  Previously we estimated 40% segmental stenosis but there    was only minimal irregularity.  Left ventriculogram:  The left ventriculogram performed in the RAO projection showed good wall motion with no areas of hypokinesis.  The estimated ejection fraction was 60%.  CONCLUSIONS:  Coronary artery disease status post prior directional atherectomy of the right coronary artery, June 2002, with 0% stenosis at the [facility] site, 40% narrowing in an acute marginal branch of the right coronary artery, no significant obstruction in an anomalous circumflex artery, and no significant obstruction in the left anterior descending artery with good left ventricular function.  RECOMMENDATIONS:  The patient has no evidence of restenosis.  I think the recent scan was falsely abnormal.  Will plan reassurance and follow-up in a year. Dictated by:   Everardo Beals Juanda Chance, M.D. [facility] Attending Physician:  Lenoria Farrier DD:  07/14/01 TD:  07/14/01 Job: [id number]  [id number]

## 2010-11-20 NOTE — Discharge Summary (Signed)
[place]. [facility]  Patient:    [name], [name]                         MRN: [id number] Adm. Date:  [id number] Disc. Date: [id number] Attending:  Lenoria Farrier Dictator:   Joellyn Rued, P.A.-C. CC:         Sonda Primes, M.D. [facility]   Referring Physician Discharge [place]  DATE OF BIRTH:  [birth year]  SUMMARY OF HISTORY:  The patient is a 60 year old white male who was transferred from [facility] Room secondary to a myocardial infarction.  At approximately 7:30 on the evening prior to admission while mowing the lawn, he suddenly developed anterior chest pressure radiating into both arms as numbness associated with shortness of breath and nausea, questionable diaphoresis.  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  All-in-all, the discomfort lasted at 10/0-10 for about one hour.  The discomfort persisted throughout the night, which he gave a 3 on a scale of 0-10; he had a very restless night.  He has not had any prior occurrence, exertional limitations, chest discomfort, shortness of breath, orthopnea, PND or edema, palpitations, syncope, GI or GU problems.  His history is notable for remote tobacco use, occasional marijuana use, two to three beers per a day, negative stress test last year.  LABORATORY DATA:  Fasting lipids showed a total cholesterol 238, triglycerides 485, HDL 50.  LDL was not calculated.  Initial CK was elevated at 222 with MB of 16.4 and relative index 7.4 and a troponin of 0.79.  Second total CK was 231 with an MB of 26.3, relative index 11.4, troponin 1.47.  Admission H&H were 11.5 and 34.4.  MCV was slightly low at 77.8.  Normal MCHC.  Platelets 166,000.  WBC was 9.0.  Sodium 140, potassium 3.8, BUN 9, creatinine 1.0.  EKG showed normal sinus rhythm, nonspecific ST-T wave changes.  HOSPITAL COURSE:  The patient was transferred emergently from [facility] to the  cardiac catheterization lab due to continuing chest discomfort and abnormal CKs and troponins.  Cardiac catheterization performed by Dr. Everardo Beals. 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  He had a 70% AM lesion and a 90% mid RCA lesion.  Ejection fraction was 60% with distal anterior hypokinesis.  DCA was performed to the RCA, reducing the 90% lesion to less than 10%.  Post ______, he was ambulating without difficulty and cardiac rehab assisted with teaching and ambulation.  Lipids were elevated, thus he was started on Lipitor.  By December 15, 2000, Dr. Juanda Chance felt that he could be discharged home.  DISCHARGE DIAGNOSES: 1. Non-Q-wave myocardial infarction. 2. Hyperlipidemia. 3. Remote tobacco. 4. Current drug usage.  DISPOSITION:  He is discharged home.  DISCHARGE MEDICATIONS: 1. Plavix 75 mg q.d. for four weeks. 2. Coated aspirin 325 mg q.d. 3. Lipitor 40 mg q.h.s. 4. Sublingual nitroglycerin as needed. 5. He was given permission to continue his Valium 5 mg q.d. and Allegra as    needed.  (It is noted that he was not placed on Altace or beta blocker secondary to blood pressure being in the 90s and 100s systolically.)  ACTIVITIES:  His activities were restricted in regards to working, lifting, driving, sexual activity or heavy exertion until being further evaluated by the physician.  DIET:  Maintain low-fat/-salt/-cholesterol diet.  SPECIAL DISCHARGE INSTRUCTIONS:  If he has any problems with his catheterization site, he was asked to call.  He was advised no smoking or tobacco or drug products.  Limit alcohol to 2 ounces per day.  He will need lipids and LFTs checked in approximately six weeks.  FOLLOWUP:  He will see Dr. Lestine Mount. on June 25th at 3:30 p.m. DD:  12/15/00 TD:  12/15/00 Job: [id number]

## 2010-11-25 ENCOUNTER — Encounter: Payer: Self-pay | Admitting: Internal Medicine

## 2010-11-26 ENCOUNTER — Ambulatory Visit (INDEPENDENT_AMBULATORY_CARE_PROVIDER_SITE_OTHER): Payer: BC Managed Care – PPO | Admitting: Internal Medicine

## 2010-11-26 ENCOUNTER — Encounter: Payer: Self-pay | Admitting: Internal Medicine

## 2010-11-26 VITALS — BP 118/80 | HR 80 | Temp 97.9°F | Resp 16 | Wt 173.0 lb

## 2010-11-26 DIAGNOSIS — L723 Sebaceous cyst: Secondary | ICD-10-CM

## 2010-11-26 NOTE — Progress Notes (Signed)
  Subjective:    Patient ID: [name], male    DOB: 19-Mar-1951, 60 y.o.   MRN: [id number]  HPI  L elbow growth  Review of Systems     Objective:   Physical Exam        Assessment & Plan:  Sebaceous cyst  Procedure Note :     Procedure :  Skin biopsy and cyst removal   Indication:  Cyst L elbow   Risks including unsuccessful procedure , bleeding, infection, bruising, scar, a need for another complete procedure and others were explained to the patient in detail as well as the benefits. Informed consent was obtained and signed.   The patient was placed in a decubitus position.  Cyst  on    L elbow measuring    11 mm   Skin over lesion was prepped with Betadine and alcohol  and anesthetized with 3 cc of 2% lidocaine and epinephrine, using a 25-gauge 1 inch needle.  Excisional biopsy with a sterile #11 blade was carried out in the usual fashion. Fatty material was expressed. Cyst wall was excised and sent for path test. Wound was repaired with 3.0 ethilone sutures. Wound was dressed.     Tolerated well. Complications none.      Postprocedure instructions :    A Band-Aid should be  changed twice daily. You can take a shower tomorrow.  Keep the wounds clean. You can wash them with liquid soap and water. Pat dry with gauze or a Kleenex tissue  Before applying antibiotic ointment and a Band-Aid.   You need to report immediately  if fever, chills or any signs of infection develop.    The biopsy results should be available in 1 -2 weeks.

## 2010-11-26 NOTE — Patient Instructions (Signed)
Postprocedure instructions :    A Band-Aid should be  changed twice daily. You can take a shower tomorrow.  Keep the wounds clean. You can wash them with liquid soap and water. Pat dry with gauze or a Kleenex tissue  Before applying antibiotic ointment and a Band-Aid.   You need to report immediately  if fever, chills or any signs of infection develop.    The biopsy results should be available in 1 -2 weeks. 

## 2010-11-26 NOTE — Assessment & Plan Note (Signed)
Procedure Note :     Procedure :  Skin biopsy and cyst removal   Indication:  Cyst L elbow   Risks including unsuccessful procedure , bleeding, infection, bruising, scar, a need for another complete procedure and others were explained to the patient in detail as well as the benefits. Informed consent was obtained and signed.   The patient was placed in a decubitus position.  Cyst  on    L elbow measuring    11 mm   Skin over lesion was prepped with Betadine and alcohol  and anesthetized with 3 cc of 2% lidocaine and epinephrine, using a 25-gauge 1 inch needle.  Excisional biopsy with a sterile #11 blade was carried out in the usual fashion. Fatty material was expressed. Cyst wall was excised and sent for path test. Wound was repaired with 3.0 ethilone sutures. Wound was dressed.     Tolerated well. Complications none.      Postprocedure instructions :    A Band-Aid should be  changed twice daily. You can take a shower tomorrow.  Keep the wounds clean. You can wash them with liquid soap and water. Pat dry with gauze or a Kleenex tissue  Before applying antibiotic ointment and a Band-Aid.   You need to report immediately  if fever, chills or any signs of infection develop.    The biopsy results should be available in 1 -2 weeks.

## 2010-12-02 ENCOUNTER — Telehealth: Payer: Self-pay | Admitting: Internal Medicine

## 2010-12-02 NOTE — Telephone Encounter (Signed)
[name], please, inform patient that bx was OK Thx

## 2010-12-03 NOTE — Telephone Encounter (Signed)
Pt informed

## 2010-12-04 ENCOUNTER — Encounter: Payer: Self-pay | Admitting: Internal Medicine

## 2010-12-04 ENCOUNTER — Ambulatory Visit (INDEPENDENT_AMBULATORY_CARE_PROVIDER_SITE_OTHER): Payer: BC Managed Care – PPO | Admitting: Internal Medicine

## 2010-12-04 VITALS — BP 118/90 | HR 80 | Temp 98.7°F | Resp 16 | Wt 171.0 lb

## 2010-12-04 DIAGNOSIS — L723 Sebaceous cyst: Secondary | ICD-10-CM

## 2010-12-05 NOTE — Progress Notes (Signed)
  Subjective:    Patient ID: [name], male    DOB: Mar 09, 1951, 60 y.o.   MRN: [id number]  HPI  For suture removal  Review of Systems     Objective:   Physical Exam        Assessment & Plan:  Two sutures were removed

## 2011-03-12 ENCOUNTER — Ambulatory Visit: Payer: BC Managed Care – PPO | Admitting: Internal Medicine

## 2011-04-06 ENCOUNTER — Ambulatory Visit: Payer: BC Managed Care – PPO | Admitting: Internal Medicine

## 2011-04-06 ENCOUNTER — Telehealth: Payer: Self-pay | Admitting: *Deleted

## 2011-04-06 MED ORDER — DIAZEPAM 10 MG PO TABS: 5.0000 mg | ORAL_TABLET | Freq: Two times a day (BID) | ORAL | Status: DC | PRN

## 2011-04-06 NOTE — Telephone Encounter (Signed)
OK to fill this prescription with additional refills x2 Thank you!  

## 2011-04-06 NOTE — Telephone Encounter (Signed)
Called in.

## 2011-04-06 NOTE — Telephone Encounter (Signed)
Rf req for Diazepam 10mg  1/2-1 po bid prn. # 60. Last filled 03-11-11. Ok to Rf?

## 2011-04-09 ENCOUNTER — Other Ambulatory Visit: Payer: Self-pay | Admitting: Internal Medicine

## 2011-04-16 ENCOUNTER — Ambulatory Visit: Payer: BC Managed Care – PPO | Admitting: Internal Medicine

## 2011-07-01 ENCOUNTER — Other Ambulatory Visit: Payer: Self-pay | Admitting: *Deleted

## 2011-07-01 NOTE — Telephone Encounter (Signed)
R'cd fax from [facility] for refill of Diazepam-last filled 04/06/2011 #60 with 2 refills.

## 2011-07-02 MED ORDER — DIAZEPAM 10 MG PO TABS: 5.0000 mg | ORAL_TABLET | Freq: Two times a day (BID) | ORAL | Status: DC | PRN

## 2011-07-02 NOTE — Telephone Encounter (Signed)
OK to fill this prescription with additional refills x2 Needs OV Thank you!  

## 2011-07-02 NOTE — Telephone Encounter (Signed)
Will have schedulers call pt to arrange OV.

## 2011-07-06 HISTORY — PX: COLONOSCOPY: SHX174

## 2011-09-29 ENCOUNTER — Telehealth: Payer: Self-pay

## 2011-09-29 NOTE — Telephone Encounter (Signed)
Rx refilled request received for Diazepam 10 mg 1/2 - 1 BID PRN, please advise.

## 2011-09-29 NOTE — Telephone Encounter (Signed)
OK to fill this prescription with additional refills x1. Needs OV Thank you!  

## 2011-09-30 MED ORDER — DIAZEPAM 10 MG PO TABS: 5.0000 mg | ORAL_TABLET | Freq: Two times a day (BID) | ORAL | Status: DC | PRN

## 2011-10-06 ENCOUNTER — Telehealth: Payer: Self-pay

## 2011-10-06 NOTE — Telephone Encounter (Signed)
Pt called stating that he had a cyst on his RT hip that has ruptured. Pt is requesting pt's advisement on treatment, doe she need an OV?

## 2011-10-06 NOTE — Telephone Encounter (Signed)
Pt advised and states that he is unsure if cyst has actually ruptured but he now feels 3 instead of initial report of 1. Pt transferred for work in appt per AVP.

## 2011-10-06 NOTE — Telephone Encounter (Signed)
Clean w/soap and water, pat dry and dress w/abx oint and band aid OV tomorrow Thx

## 2011-10-07 ENCOUNTER — Ambulatory Visit (INDEPENDENT_AMBULATORY_CARE_PROVIDER_SITE_OTHER): Payer: 59 | Admitting: Internal Medicine

## 2011-10-07 ENCOUNTER — Encounter: Payer: Self-pay | Admitting: Internal Medicine

## 2011-10-07 VITALS — BP 120/82 | HR 80 | Temp 98.2°F | Ht 70.0 in | Wt 171.8 lb

## 2011-10-07 DIAGNOSIS — F329 Major depressive disorder, single episode, unspecified: Secondary | ICD-10-CM

## 2011-10-07 DIAGNOSIS — R229 Localized swelling, mass and lump, unspecified: Secondary | ICD-10-CM

## 2011-10-07 DIAGNOSIS — Z87891 Personal history of nicotine dependence: Secondary | ICD-10-CM | POA: Insufficient documentation

## 2011-10-07 DIAGNOSIS — F3289 Other specified depressive episodes: Secondary | ICD-10-CM

## 2011-10-07 DIAGNOSIS — Z720 Tobacco use: Secondary | ICD-10-CM

## 2011-10-07 DIAGNOSIS — L089 Local infection of the skin and subcutaneous tissue, unspecified: Secondary | ICD-10-CM

## 2011-10-07 DIAGNOSIS — J449 Chronic obstructive pulmonary disease, unspecified: Secondary | ICD-10-CM

## 2011-10-07 DIAGNOSIS — L723 Sebaceous cyst: Secondary | ICD-10-CM

## 2011-10-07 DIAGNOSIS — F17200 Nicotine dependence, unspecified, uncomplicated: Secondary | ICD-10-CM

## 2011-10-07 DIAGNOSIS — F528 Other sexual dysfunction not due to a substance or known physiological condition: Secondary | ICD-10-CM

## 2011-10-07 MED ORDER — DOXYCYCLINE HYCLATE 100 MG PO TABS: 100.0000 mg | ORAL_TABLET | Freq: Two times a day (BID) | ORAL | Status: AC

## 2011-10-07 MED ORDER — DIAZEPAM 10 MG PO TABS: 5.0000 mg | ORAL_TABLET | Freq: Two times a day (BID) | ORAL | Status: DC | PRN

## 2011-10-07 MED ORDER — CEFTRIAXONE SODIUM 1 G IJ SOLR
1.0000 g | Freq: Once | INTRAMUSCULAR | Status: AC
  Administered 2011-10-07: 1 g via INTRAMUSCULAR

## 2011-10-07 NOTE — Assessment & Plan Note (Signed)
He needs to quit smoking.

## 2011-10-07 NOTE — Assessment & Plan Note (Signed)
discussed

## 2011-10-07 NOTE — Progress Notes (Signed)
Patient ID: [name], male   DOB: 01/14/1951, 61 y.o.   MRN: [id number]  Subjective:    Patient ID: [name], male    DOB: Jun 24, 1951, 61 y.o.   MRN: [id number]  HPI C/o painful swelling developed over past 2 d out of chronic cyst on L thigh; tender  Smoking 2 ppd  C/o ED  F/u anxiety  Wt Readings from Last 3 Encounters:  10/07/11 171 lb 12.8 oz (77.928 kg)  12/04/10 171 lb (77.565 kg)  11/26/10 173 lb (78.472 kg)   BP Readings from Last 3 Encounters:  10/07/11 120/82  12/04/10 118/90  11/26/10 118/80      Review of Systems  Constitutional: Negative for chills and fatigue.  HENT: Positive for hearing loss.   Respiratory: Positive for cough and shortness of breath.   Cardiovascular: Negative for leg swelling.  Gastrointestinal: Negative for nausea.  Genitourinary: Positive for frequency.  Musculoskeletal: Positive for back pain.  Neurological: Negative for weakness.  Psychiatric/Behavioral: Negative for suicidal ideas and confusion.       Objective:   Physical Exam  Constitutional: He is oriented to person, place, and time. He appears well-developed.  HENT:  Mouth/Throat: Oropharynx is clear and moist.  Eyes: Conjunctivae are normal. Pupils are equal, round, and reactive to light.  Neck: Normal range of motion. No JVD present. No thyromegaly present.  Cardiovascular: Normal rate, regular rhythm, normal heart sounds and intact distal pulses.  Exam reveals no gallop and no friction rub.   No murmur heard. Pulmonary/Chest: Effort normal and breath sounds normal. No respiratory distress. He has no wheezes. He has no rales. He exhibits no tenderness.  Abdominal: Soft. Bowel sounds are normal. He exhibits no distension and no mass. There is no tenderness. There is no rebound and no guarding.  Musculoskeletal: Normal range of motion. He exhibits no edema and no tenderness.  Lymphadenopathy:    He has no cervical adenopathy.  Neurological: He is alert and oriented to  person, place, and time. He has normal reflexes. No cranial nerve deficit. He exhibits normal muscle tone. Coordination normal.  Skin: Skin is warm and dry. No rash noted. There is erythema.       Large nodular sensitive sq mass in L posterolat prox thigh w/slight erythema 10x10 cm 1 cm xiphoid cyst NT  Psychiatric: He has a normal mood and affect. His behavior is normal. Judgment and thought content normal.   Procedure Note :    Procedure :   Point of care (POC) sonography examination   Indication: L prox thigh posterolat mass   Equipment used: Sonosite M-Turbo with HFL38x/13-6 MHz transducer linear probe. The images were stored in the unit and later transferred in storage.  The patient was placed in a standing position.  This study revealed several hypoechoic/heteroechoic oval subcutaneous masses in the area of  prox posterolat thigh surrounding edema.  Doppler (-) for flow   Impression: Complex cystic large posterolateral L thigh mass   Lab Results  Component Value Date   WBC 6.6 07/29/2009   HGB 14.1 07/29/2009   HCT 43.6 07/29/2009   PLT 176.0 07/29/2009   GLUCOSE 103* 11/28/2009   CHOL 210* 11/28/2009   TRIG 350.0* 11/28/2009   HDL 79.50 11/28/2009   LDLDIRECT 78.9 11/28/2009   LDLCALC 65 11/22/2006   ALT 30 11/28/2009   AST 34 11/28/2009   NA 139 11/28/2009   K 4.2 11/28/2009   CL 104 11/28/2009   CREATININE 0.9 11/28/2009   BUN  14 11/28/2009   CO2 28 11/28/2009   TSH 3.53 07/29/2009   PSA 0.94 07/11/2007          Assessment & Plan:

## 2011-10-07 NOTE — Assessment & Plan Note (Signed)
4/13 Complex cystic large posterolateral L thigh mass - infected  Doxy x 14 d Surg cons

## 2011-10-07 NOTE — Assessment & Plan Note (Signed)
Continue with current prescription therapy as reflected on the Med list.  

## 2011-10-10 ENCOUNTER — Encounter: Payer: Self-pay | Admitting: Internal Medicine

## 2011-10-14 ENCOUNTER — Telehealth: Payer: Self-pay | Admitting: *Deleted

## 2011-10-14 ENCOUNTER — Encounter (INDEPENDENT_AMBULATORY_CARE_PROVIDER_SITE_OTHER): Payer: Self-pay | Admitting: Surgery

## 2011-10-14 DIAGNOSIS — Z0000 Encounter for general adult medical examination without abnormal findings: Secondary | ICD-10-CM

## 2011-10-14 DIAGNOSIS — Z0389 Encounter for observation for other suspected diseases and conditions ruled out: Secondary | ICD-10-CM

## 2011-10-14 NOTE — Telephone Encounter (Signed)
June CPE labs entered.  

## 2011-10-15 ENCOUNTER — Ambulatory Visit (INDEPENDENT_AMBULATORY_CARE_PROVIDER_SITE_OTHER): Payer: 59 | Admitting: Surgery

## 2011-10-15 ENCOUNTER — Encounter (INDEPENDENT_AMBULATORY_CARE_PROVIDER_SITE_OTHER): Payer: Self-pay | Admitting: Surgery

## 2011-10-15 VITALS — BP 130/84 | HR 88 | Temp 98.2°F | Resp 18 | Ht 70.0 in | Wt 164.8 lb

## 2011-10-15 DIAGNOSIS — L723 Sebaceous cyst: Secondary | ICD-10-CM

## 2011-10-15 DIAGNOSIS — L089 Local infection of the skin and subcutaneous tissue, unspecified: Secondary | ICD-10-CM

## 2011-10-15 NOTE — Progress Notes (Signed)
Patient ID: [name], male   DOB: 08-26-1950, 61 y.o.   MRN: [id number]  Chief Complaint  Patient presents with  . Cyst    chest and left thigh    HPI AUGUST LONGEST [name] is a 61 y.o. male.  Referred by Dr. Posey Rea for evaluation of left thigh cyst. HPI The patient is a 61 yo male with a long history of multiple sebaceous cysts.  He has had several removed, including some from his chest.  Several weeks ago, he developed swelling and inflammation around a couple of larger cysts in his left lateral upper thigh.  Dr. Posey Rea examined these with ultrasound and saw no abscess.  The inflammation has resolved with Doxycycline.  He has also developed a recurrent cyst in his mid-chest. Past Medical History  Diagnosis Date  . Anxiety   . Depression   . ED (erectile dysfunction)   . Hyperlipidemia   . OA (osteoarthritis)   . Allergy to bee sting   . Dermatitis due to sun   . CAD (coronary artery disease)     statin intolerant  . MI (myocardial infarction) 2002  . Vitamin d deficiency 2008  . Tobacco abuse     Past Surgical History  Procedure Date  . Cardiac catherization 07-14-01  . Stress cardiolite 06-30-01  . Esophagogastroduodenoscopy 10-06-01    Family History  Problem Relation Age of Onset  . Cancer Brother   . Heart disease Father     Social History History  Substance Use Topics  . Smoking status: Current Everyday Smoker -- 1.0 packs/day    Types: Cigarettes  . Smokeless tobacco: Not on file  . Alcohol Use: Yes    Allergies  Allergen Reactions  . Atorvastatin     REACTION: arthralgia    Current Outpatient Prescriptions  Medication Sig Dispense Refill  . aspirin 81 MG tablet Take 81 mg by mouth daily.        . cholecalciferol (VITAMIN D) 1000 UNITS tablet Take 2,000 Units by mouth daily.       . diazepam (VALIUM) 10 MG tablet Take 0.5-1 tablets (5-10 mg total) by mouth 2 (two) times daily as needed.  60 tablet  2  . doxycycline (VIBRA-TABS) 100 MG tablet  Take 1 tablet (100 mg total) by mouth 2 (two) times daily.  28 tablet  1  . gemfibrozil (LOPID) 600 MG tablet TAKE 1 TABLET BY MOUTH ONCE A DAY  30 tablet  5  . loratadine (CLARITIN) 10 MG tablet Take 10 mg by mouth daily.        . Omega-3 Fatty Acids (FISH OIL) 1000 MG CAPS Take 1 capsule by mouth 2 (two) times daily.        . sildenafil (VIAGRA) 100 MG tablet Take 100 mg by mouth daily as needed.          Review of Systems Review of Systems  Constitutional: Negative for fever, chills and unexpected weight change.  HENT: Positive for hearing loss and congestion. Negative for sore throat, trouble swallowing and voice change.   Eyes: Negative for visual disturbance.  Respiratory: Negative for cough and wheezing.   Cardiovascular: Negative for chest pain, palpitations and leg swelling.  Gastrointestinal: Negative for nausea, vomiting, abdominal pain, diarrhea, constipation, blood in stool, abdominal distention, anal bleeding and rectal pain.  Genitourinary: Negative for hematuria and difficulty urinating.  Musculoskeletal: Positive for arthralgias.  Skin: Negative for rash and wound.  Neurological: Negative for seizures, syncope, weakness and headaches.  Hematological: Negative for adenopathy. Does not bruise/bleed easily.  Psychiatric/Behavioral: Negative for confusion.    Blood pressure 130/84, pulse 88, temperature 98.2 F (36.8 C), temperature source Temporal, resp. rate 18, height 5\' 10"  (1.778 m), weight 164 lb 12.8 oz (74.753 kg).  Physical Exam Physical Exam WDWN in NAD Smells of tobacco smoke Chest - over sternum - healed 1 cm incision with underlying purplish cystic lesion; no surrounding inflammation Left upper lateral thigh - two adjacent subcutaneous masses - one measures about 3 cm across and the other measures about 2 cm across.  No inflammation or infection.  These are soft and fairly well-demarcated.   Data Reviewed none  Assessment    Sebaceous cysts - mid-chest  1 cm; left thigh 3 and 2 cm - subcutaneous    Plan    Will excise all of these in the office under local anesthetic.  The surgical procedure has been discussed with the patient.  Potential risks, benefits, alternative treatments, and expected outcomes have been explained.  All of the patient's questions at this time have been answered.  The likelihood of reaching the patient's treatment goal is good.  The patient understand the proposed surgical procedure and wishes to proceed. Will schedule at the next available date.        Maicol Bowland K. 10/15/2011, 3:34 PM

## 2011-10-31 ENCOUNTER — Encounter: Payer: Self-pay | Admitting: Internal Medicine

## 2011-11-09 ENCOUNTER — Other Ambulatory Visit (INDEPENDENT_AMBULATORY_CARE_PROVIDER_SITE_OTHER): Payer: Self-pay | Admitting: Surgery

## 2011-11-09 ENCOUNTER — Encounter (INDEPENDENT_AMBULATORY_CARE_PROVIDER_SITE_OTHER): Payer: Self-pay | Admitting: Surgery

## 2011-11-09 ENCOUNTER — Ambulatory Visit (INDEPENDENT_AMBULATORY_CARE_PROVIDER_SITE_OTHER): Payer: 59 | Admitting: Surgery

## 2011-11-09 VITALS — BP 132/84 | HR 76 | Temp 99.3°F | Resp 20 | Ht 70.0 in | Wt 166.4 lb

## 2011-11-09 DIAGNOSIS — L723 Sebaceous cyst: Secondary | ICD-10-CM

## 2011-11-09 MED ORDER — HYDROCODONE-ACETAMINOPHEN 5-500 MG PO TABS: 1.0000 | ORAL_TABLET | ORAL | Status: AC | PRN

## 2011-11-09 NOTE — Progress Notes (Signed)
He comes in today for excision of a cyst on the middle of his chest as well a couple of palpable masses in his proximal left thigh.  We began on his chest.  The area around the cyst was shaved, prepped with Betadine and anesthetized with 1% lidocaine.  A transverse incision was made and a sebaceous cyst was completely removed.  The wound was closed with 4-0 monocryl and dressed with benzoin, steri-strips, and tegaderm.    We then positioned him on his right side.  His left thigh was prepped with betadine and anesthetized with 1% lidocaine.  A 3cm longitudinal incision was made.  We excised a total of three cysts from this area.  These each measured at least 2 cm in diameter.  These were sent together for pathologic examination.  The wound was closed with 3-0 vicryl and 4-0 monocryl.  It was dressed in similar fashion.    We will call the patient with his pathology results.  I gave him a prescription for vicodin.  Follow-up PRN.  Wilmon Arms. Corliss Skains, MD, [facility]  11/09/2011 9:50 PM

## 2011-11-09 NOTE — Patient Instructions (Signed)
Remove the outer dressings on Thursday and shower over the steri-strips until they fall off on their own We will call you with the pathology report.

## 2011-11-15 NOTE — Progress Notes (Signed)
Quick Note:  Please call the patient and let them know that their path is normal. Just sebaceous cysts. ______

## 2011-12-10 ENCOUNTER — Telehealth: Payer: Self-pay | Admitting: Internal Medicine

## 2011-12-10 NOTE — Telephone Encounter (Signed)
OK a zpac OV in 1-2 wks Thx

## 2011-12-10 NOTE — Telephone Encounter (Signed)
Caller: Oluwademilade/Patient; PCP: Sonda Primes; CB#: [phone] ; Call regarding Cough/Congestion;  Pt calling regarding chest and sinus congestion-onset 12-08-11. Pt trx with Mucinex but no better. Has not taken temp but "feels like he could be running a bit".  Disp: See pt w/in 24 hrs per Cough -Adult protocol. Pt is at work and unable to leave b/c of no coverage requesting an antibx be called in to [facility] [phone]. Explained to pt that a pt must be seen before an anitbiotic can be called int. Pt asked that a note be sent anyway. States MD has done it before.

## 2011-12-14 ENCOUNTER — Encounter: Payer: BC Managed Care – PPO | Admitting: Internal Medicine

## 2012-01-24 ENCOUNTER — Telehealth: Payer: Self-pay | Admitting: *Deleted

## 2012-01-24 MED ORDER — DIAZEPAM 10 MG PO TABS: 5.0000 mg | ORAL_TABLET | Freq: Two times a day (BID) | ORAL | Status: DC | PRN

## 2012-01-24 NOTE — Telephone Encounter (Signed)
Rf req for Diazepam 10 mg 1/2-1 po bid prn. Ok to Rf?

## 2012-01-24 NOTE — Telephone Encounter (Signed)
OK to fill this prescription with additional refills x0 Thank you!  

## 2012-01-24 NOTE — Telephone Encounter (Signed)
Done

## 2012-02-21 ENCOUNTER — Telehealth: Payer: Self-pay | Admitting: *Deleted

## 2012-02-21 MED ORDER — DIAZEPAM 10 MG PO TABS: 5.0000 mg | ORAL_TABLET | Freq: Two times a day (BID) | ORAL | Status: DC | PRN

## 2012-02-21 NOTE — Telephone Encounter (Signed)
Done

## 2012-02-21 NOTE — Telephone Encounter (Signed)
OK to fill this prescription with additional refills x0 Needs OV Thank you!  

## 2012-02-21 NOTE — Telephone Encounter (Signed)
Rf req for Diazepam 10 mg 1/2-1 po bid. Last filled 01/24/12. Ok to Rf?

## 2012-03-01 ENCOUNTER — Other Ambulatory Visit (INDEPENDENT_AMBULATORY_CARE_PROVIDER_SITE_OTHER): Payer: 59

## 2012-03-01 DIAGNOSIS — Z0000 Encounter for general adult medical examination without abnormal findings: Secondary | ICD-10-CM

## 2012-03-01 DIAGNOSIS — Z0389 Encounter for observation for other suspected diseases and conditions ruled out: Secondary | ICD-10-CM

## 2012-03-01 LAB — URINALYSIS, ROUTINE W REFLEX MICROSCOPIC
Nitrite: NEGATIVE
Specific Gravity, Urine: 1.01 (ref 1.000–1.030)
Total Protein, Urine: NEGATIVE
pH: 7 (ref 5.0–8.0)

## 2012-03-01 LAB — LIPID PANEL
Cholesterol: 175 mg/dL (ref 0–200)
LDL Cholesterol: 59 mg/dL (ref 0–99)
Triglycerides: 148 mg/dL (ref 0.0–149.0)
VLDL: 29.6 mg/dL (ref 0.0–40.0)

## 2012-03-01 LAB — CBC WITH DIFFERENTIAL/PLATELET
Basophils Absolute: 0.1 10*3/uL (ref 0.0–0.1)
Eosinophils Absolute: 0.1 10*3/uL (ref 0.0–0.7)
Hemoglobin: 13.2 g/dL (ref 13.0–17.0)
Lymphocytes Relative: 24.6 % (ref 12.0–46.0)
MCHC: 32.8 g/dL (ref 30.0–36.0)
MCV: 85.8 fl (ref 78.0–100.0)
Monocytes Absolute: 0.8 10*3/uL (ref 0.1–1.0)
Neutro Abs: 3.6 10*3/uL (ref 1.4–7.7)
RDW: 20.8 % — ABNORMAL HIGH (ref 11.5–14.6)

## 2012-03-01 LAB — PSA: PSA: 1.03 ng/mL (ref 0.10–4.00)

## 2012-03-01 LAB — BASIC METABOLIC PANEL
BUN: 13 mg/dL (ref 6–23)
Creatinine, Ser: 1 mg/dL (ref 0.4–1.5)
GFR: 80.73 mL/min (ref >60.00)
Potassium: 4.4 mEq/L (ref 3.5–5.1)

## 2012-03-01 LAB — HEPATIC FUNCTION PANEL: Total Bilirubin: 0.8 mg/dL (ref 0.3–1.2)

## 2012-03-08 ENCOUNTER — Ambulatory Visit (INDEPENDENT_AMBULATORY_CARE_PROVIDER_SITE_OTHER): Payer: 59 | Admitting: Internal Medicine

## 2012-03-08 ENCOUNTER — Encounter: Payer: Self-pay | Admitting: Internal Medicine

## 2012-03-08 VITALS — BP 130/60 | HR 68 | Temp 98.5°F | Resp 16 | Ht 70.0 in | Wt 156.0 lb

## 2012-03-08 DIAGNOSIS — Z23 Encounter for immunization: Secondary | ICD-10-CM

## 2012-03-08 DIAGNOSIS — Z720 Tobacco use: Secondary | ICD-10-CM

## 2012-03-08 DIAGNOSIS — Z136 Encounter for screening for cardiovascular disorders: Secondary | ICD-10-CM

## 2012-03-08 DIAGNOSIS — F1010 Alcohol abuse, uncomplicated: Secondary | ICD-10-CM

## 2012-03-08 DIAGNOSIS — F411 Generalized anxiety disorder: Secondary | ICD-10-CM

## 2012-03-08 DIAGNOSIS — F17200 Nicotine dependence, unspecified, uncomplicated: Secondary | ICD-10-CM

## 2012-03-08 DIAGNOSIS — Z0000 Encounter for general adult medical examination without abnormal findings: Secondary | ICD-10-CM

## 2012-03-08 DIAGNOSIS — J449 Chronic obstructive pulmonary disease, unspecified: Secondary | ICD-10-CM

## 2012-03-08 DIAGNOSIS — F1011 Alcohol abuse, in remission: Secondary | ICD-10-CM | POA: Insufficient documentation

## 2012-03-08 DIAGNOSIS — N529 Male erectile dysfunction, unspecified: Secondary | ICD-10-CM

## 2012-03-08 DIAGNOSIS — Z1211 Encounter for screening for malignant neoplasm of colon: Secondary | ICD-10-CM

## 2012-03-08 MED ORDER — DIAZEPAM 10 MG PO TABS: 5.0000 mg | ORAL_TABLET | Freq: Two times a day (BID) | ORAL | Status: DC | PRN

## 2012-03-08 NOTE — Assessment & Plan Note (Signed)
Chronic 1-2 ppd

## 2012-03-08 NOTE — Assessment & Plan Note (Signed)
Continue with current prescription therapy as reflected on the Med list.  

## 2012-03-08 NOTE — Progress Notes (Signed)
   Subjective:    Patient ID: [name], male    DOB: [birth year], 61 y.o.   MRN: [id number]  HPI  The patient is here for a wellness exam. The patient has been doing well overall without major physical or psychological issues going on lately. The patient needs to address  chronic hypertension that has been well controlled with medicines; to address chronic  hyperlipidemia controlled with medicines as well; and to address COPD, smoking...  Smoking 2 ppd  C/o ED  F/u anxiety  Wt Readings from Last 3 Encounters:  03/08/12 156 lb (70.761 kg)  11/09/11 166 lb 6.4 oz (75.479 kg)  10/15/11 164 lb 12.8 oz (74.753 kg)   BP Readings from Last 3 Encounters:  03/08/12 130/60  11/09/11 132/84  10/15/11 130/84      Review of Systems  Constitutional: Negative for chills and fatigue.  HENT: Positive for hearing loss.   Respiratory: Positive for cough and shortness of breath.   Cardiovascular: Negative for leg swelling.  Gastrointestinal: Negative for nausea.  Genitourinary: Positive for frequency.  Musculoskeletal: Positive for back pain.  Neurological: Negative for weakness.  Psychiatric/Behavioral: Negative for suicidal ideas and confusion.       Objective:   Physical Exam  Constitutional: He is oriented to person, place, and time. He appears well-developed.  HENT:  Mouth/Throat: Oropharynx is clear and moist.  Eyes: Conjunctivae are normal. Pupils are equal, round, and reactive to light.  Neck: Normal range of motion. No JVD present. No thyromegaly present.  Cardiovascular: Normal rate, regular rhythm, normal heart sounds and intact distal pulses.  Exam reveals no gallop and no friction rub.   No murmur heard. Pulmonary/Chest: Effort normal and breath sounds normal. No respiratory distress. He has no wheezes. He has no rales. He exhibits no tenderness.  Abdominal: Soft. Bowel sounds are normal. He exhibits no distension and no mass. There is no tenderness. There is no  rebound and no guarding.  Genitourinary: Rectum normal, prostate normal and penis normal. Guaiac negative stool. No penile tenderness.  Musculoskeletal: Normal range of motion. He exhibits no edema and no tenderness.  Lymphadenopathy:    He has no cervical adenopathy.  Neurological: He is alert and oriented to person, place, and time. He has normal reflexes. No cranial nerve deficit. He exhibits normal muscle tone. Coordination normal.  Skin: Skin is warm and dry. No rash noted. There is erythema.       scars  Psychiatric: He has a normal mood and affect. His behavior is normal. Judgment and thought content normal.    Lab Results  Component Value Date   WBC 6.0 03/01/2012   HGB 13.2 03/01/2012   HCT 40.2 03/01/2012   PLT 189.0 03/01/2012   GLUCOSE 94 03/01/2012   CHOL 175 03/01/2012   TRIG 148.0 03/01/2012   HDL 86.50 03/01/2012   LDLDIRECT 78.9 11/28/2009   LDLCALC 59 03/01/2012   ALT 49 03/01/2012   AST 57* 03/01/2012   NA 135 03/01/2012   K 4.4 03/01/2012   CL 101 03/01/2012   CREATININE 1.0 03/01/2012   BUN 13 03/01/2012   CO2 27 03/01/2012   TSH 1.94 03/01/2012   PSA 1.03 03/01/2012          Assessment & Plan:

## 2012-03-08 NOTE — Assessment & Plan Note (Signed)
Discussed.

## 2012-03-08 NOTE — Assessment & Plan Note (Signed)
He needs to quit smoking.

## 2012-03-09 ENCOUNTER — Encounter: Payer: Self-pay | Admitting: Internal Medicine

## 2012-03-16 ENCOUNTER — Ambulatory Visit (INDEPENDENT_AMBULATORY_CARE_PROVIDER_SITE_OTHER)
Admission: RE | Admit: 2012-03-16 | Discharge: 2012-03-16 | Disposition: A | Discharge status: Home / Self Care | Payer: 59 | Source: Ambulatory Visit | Attending: Internal Medicine | Admitting: Internal Medicine

## 2012-03-16 DIAGNOSIS — F17200 Nicotine dependence, unspecified, uncomplicated: Secondary | ICD-10-CM

## 2012-03-16 DIAGNOSIS — J449 Chronic obstructive pulmonary disease, unspecified: Secondary | ICD-10-CM

## 2012-03-16 DIAGNOSIS — Z0000 Encounter for general adult medical examination without abnormal findings: Secondary | ICD-10-CM

## 2012-03-20 ENCOUNTER — Telehealth: Payer: Self-pay | Admitting: Internal Medicine

## 2012-03-20 NOTE — Telephone Encounter (Signed)
Pt informed of CXR results

## 2012-03-20 NOTE — Telephone Encounter (Signed)
Patient would like to speak with someone about the results of his chest xray

## 2012-03-30 ENCOUNTER — Encounter: Payer: Self-pay | Admitting: Internal Medicine

## 2012-03-30 ENCOUNTER — Ambulatory Visit (AMBULATORY_SURGERY_CENTER): Payer: 59 | Admitting: *Deleted

## 2012-03-30 VITALS — Ht 70.0 in | Wt 157.6 lb

## 2012-03-30 DIAGNOSIS — Z1211 Encounter for screening for malignant neoplasm of colon: Secondary | ICD-10-CM

## 2012-03-30 MED ORDER — NA SULFATE-K SULFATE-MG SULF 17.5-3.13-1.6 GM/177ML PO SOLN: 1.0000 | Freq: Once | ORAL | Status: DC

## 2012-04-12 ENCOUNTER — Ambulatory Visit (AMBULATORY_SURGERY_CENTER): Payer: 59 | Admitting: Internal Medicine

## 2012-04-12 ENCOUNTER — Encounter: Payer: Self-pay | Admitting: Internal Medicine

## 2012-04-12 VITALS — BP 113/63 | HR 59 | Temp 97.5°F | Resp 20 | Ht 70.0 in | Wt 157.0 lb

## 2012-04-12 DIAGNOSIS — K648 Other hemorrhoids: Secondary | ICD-10-CM

## 2012-04-12 DIAGNOSIS — D126 Benign neoplasm of colon, unspecified: Secondary | ICD-10-CM

## 2012-04-12 DIAGNOSIS — Z1211 Encounter for screening for malignant neoplasm of colon: Secondary | ICD-10-CM

## 2012-04-12 DIAGNOSIS — K635 Polyp of colon: Secondary | ICD-10-CM

## 2012-04-12 MED ORDER — SODIUM CHLORIDE 0.9 % IV SOLN: 500.0000 mL | INTRAVENOUS | Status: DC

## 2012-04-12 NOTE — Progress Notes (Signed)
Patient did not experience any of the following events: a burn prior to discharge; a fall within the facility; wrong site/side/patient/procedure/implant event; or a hospital transfer or hospital admission upon discharge from the facility. (G8907) Patient did not have preoperative order for IV antibiotic SSI prophylaxis. (G8918)  

## 2012-04-12 NOTE — Patient Instructions (Addendum)
One tiny polyp was removed. I will let you know what it was and what it means by sending a letter.  You also had small hemorrhoids inside the rectum.  Thank you for choosing me and [facility].  Iva Boop, MD, [facility]  Discharge instructions given with verbal understanding. Handouts on polyps and hemorrhoids given. Resume previous medications. YOU HAD AN ENDOSCOPIC PROCEDURE TODAY AT THE [facility]: Refer to the procedure report that was given to you for any specific questions about what was found during the examination.  If the procedure report does not answer your questions, please call your gastroenterologist to clarify.  If you requested that your care partner not be given the details of your procedure findings, then the procedure report has been included in a sealed envelope for you to review at your convenience later.  YOU SHOULD EXPECT: Some feelings of bloating in the abdomen. Passage of more gas than usual.  Walking can help get rid of the air that was put into your GI tract during the procedure and reduce the bloating. If you had a lower endoscopy (such as a colonoscopy or flexible sigmoidoscopy) you may notice spotting of blood in your stool or on the toilet paper. If you underwent a bowel prep for your procedure, then you may not have a normal bowel movement for a few days.  DIET: Your first meal following the procedure should be a light meal and then it is ok to progress to your normal diet.  A half-sandwich or bowl of soup is an example of a good first meal.  Heavy or fried foods are harder to digest and may make you feel nauseous or bloated.  Likewise meals heavy in dairy and vegetables can cause extra gas to form and this can also increase the bloating.  Drink plenty of fluids but you should avoid alcoholic beverages for 24 hours.  ACTIVITY: Your care partner should take you home directly after the procedure.  You should plan to take it easy, moving  slowly for the rest of the day.  You can resume normal activity the day after the procedure however you should NOT DRIVE or use heavy machinery for 24 hours (because of the sedation medicines used during the test).    SYMPTOMS TO REPORT IMMEDIATELY: A gastroenterologist can be reached at any hour.  During normal business hours, 8:30 AM to 5:00 PM Monday through Friday, call [phone].  After hours and on weekends, please call the GI answering service at [phone] who will take a message and have the physician on call contact you.   Following lower endoscopy (colonoscopy or flexible sigmoidoscopy):  Excessive amounts of blood in the stool  Significant tenderness or worsening of abdominal pains  Swelling of the abdomen that is new, acute  Fever of 100F or higher FOLLOW UP: If any biopsies were taken you will be contacted by phone or by letter within the next 1-3 weeks.  Call your gastroenterologist if you have not heard about the biopsies in 3 weeks.  Our staff will call the home number listed on your records the next business day following your procedure to check on you and address any questions or concerns that you may have at that time regarding the information given to you following your procedure. This is a courtesy call and so if there is no answer at the home number and we have not heard from you through the emergency physician on call, we will assume that  you have returned to your regular daily activities without incident.  SIGNATURES/CONFIDENTIALITY: You and/or your care partner have signed paperwork which will be entered into your electronic medical record.  These signatures attest to the fact that that the information above on your After Visit Summary has been reviewed and is understood.  Full responsibility of the confidentiality of this discharge information lies with you and/or your care-partner.

## 2012-04-12 NOTE — Op Note (Signed)
[facility] [address] [place], [address]   COLONOSCOPY PROCEDURE REPORT  PATIENT: Kiet, Geer.  III  MR#: [id number] BIRTHDATE: 02/14/1951 , 61  yrs. old GENDER: Male ENDOSCOPIST: Iva Boop, MD, [facility] REFERRED [id number], Linda Hedges. PROCEDURE DATE:  04/12/2012 PROCEDURE:   Colonoscopy with biopsy ASA CLASS:   Class III INDICATIONS:average risk screening. MEDICATIONS: propofol (Diprivan) 350mg  IV, MAC sedation, administered by CRNA, and These medications were titrated to patient response per physician's verbal order  DESCRIPTION OF PROCEDURE:   After the risks benefits and alternatives of the procedure were thoroughly explained, informed consent was obtained.  A digital rectal exam revealed no abnormalities of the rectum and A digital rectal exam revealed the prostate was not enlarged.   The LB CF-[id number]  endoscope was introduced through the anus and advanced to the cecum, which was identified by both the appendix and ileocecal valve. No adverse events experienced.   The quality of the prep was Suprep adequate The instrument was then slowly withdrawn as the colon was fully examined.      COLON FINDINGS: A sessile polyp measuring 3 mm in size was found in the ascending colon.  A polypectomy was performed with cold forceps.  The resection was complete and the polyp tissue was completely retrieved.   The colon mucosa was otherwise normal. Small internal hemorrhoids were found.  Retroflexed views revealed internal hemorrhoids. The time to cecum=4 minutes 29 seconds. Withdrawal time=9 minutes 05 seconds.  The scope was withdrawn and the procedure completed. COMPLICATIONS: There were no complications.  ENDOSCOPIC IMPRESSION: 1.   Sessile polyp measuring 3 mm in size was found in the ascending colon; polypectomy was performed with cold forceps 2.   The colon mucosa was otherwise normal w/ adequate prep 3.   Small internal  hemorrhoids  RECOMMENDATIONS: Timing of repeat colonoscopy will be determined by pathology findings.   eSigned:  Iva Boop, MD, [facility] 04/12/2012 12:56 PM   cc: Linda Hedges.  Plotnikov, MD and The Patient

## 2012-04-13 ENCOUNTER — Telehealth: Payer: Self-pay | Admitting: *Deleted

## 2012-04-13 NOTE — Telephone Encounter (Signed)
  Follow up Call-  Call back number 04/12/2012  Post procedure Call Back phone  # [phone]  Permission to leave phone message Yes     Patient questions:  Do you have a fever, pain , or abdominal swelling? no Pain Score  0 *  Have you tolerated food without any problems? yes  Have you been able to return to your normal activities? yes  Do you have any questions about your discharge instructions: Diet   no Medications  no Follow up visit  no  Do you have questions or concerns about your Care? no  Actions: * If pain score is 4 or above: No action needed, pain <4.

## 2012-04-17 ENCOUNTER — Encounter: Payer: Self-pay | Admitting: Internal Medicine

## 2012-04-17 DIAGNOSIS — Z86010 Personal history of colonic polyps: Secondary | ICD-10-CM | POA: Insufficient documentation

## 2012-04-17 NOTE — Progress Notes (Signed)
Quick Note:  Diminutive adenoma Repeat colonoscopy about 04/2017 ______ 

## 2012-07-13 ENCOUNTER — Ambulatory Visit (INDEPENDENT_AMBULATORY_CARE_PROVIDER_SITE_OTHER): Payer: BC Managed Care – PPO | Admitting: Internal Medicine

## 2012-07-13 ENCOUNTER — Encounter: Payer: Self-pay | Admitting: Internal Medicine

## 2012-07-13 VITALS — BP 122/76 | HR 84 | Temp 97.7°F | Wt 159.0 lb

## 2012-07-13 DIAGNOSIS — F1010 Alcohol abuse, uncomplicated: Secondary | ICD-10-CM

## 2012-07-13 DIAGNOSIS — Z720 Tobacco use: Secondary | ICD-10-CM

## 2012-07-13 DIAGNOSIS — F17200 Nicotine dependence, unspecified, uncomplicated: Secondary | ICD-10-CM

## 2012-07-13 DIAGNOSIS — F329 Major depressive disorder, single episode, unspecified: Secondary | ICD-10-CM

## 2012-07-13 DIAGNOSIS — F411 Generalized anxiety disorder: Secondary | ICD-10-CM

## 2012-07-13 DIAGNOSIS — F3289 Other specified depressive episodes: Secondary | ICD-10-CM

## 2012-07-13 DIAGNOSIS — N529 Male erectile dysfunction, unspecified: Secondary | ICD-10-CM

## 2012-07-13 DIAGNOSIS — J4489 Other specified chronic obstructive pulmonary disease: Secondary | ICD-10-CM

## 2012-07-13 DIAGNOSIS — I2510 Atherosclerotic heart disease of native coronary artery without angina pectoris: Secondary | ICD-10-CM

## 2012-07-13 DIAGNOSIS — J449 Chronic obstructive pulmonary disease, unspecified: Secondary | ICD-10-CM

## 2012-07-13 DIAGNOSIS — E785 Hyperlipidemia, unspecified: Secondary | ICD-10-CM

## 2012-07-13 MED ORDER — DULOXETINE HCL 30 MG PO CPEP: 30.0000 mg | ORAL_CAPSULE | Freq: Every day | ORAL | Status: DC

## 2012-07-13 MED ORDER — DIAZEPAM 10 MG PO TABS: 5.0000 mg | ORAL_TABLET | Freq: Two times a day (BID) | ORAL | Status: DC | PRN

## 2012-07-13 NOTE — Progress Notes (Signed)
   Subjective:    HPI   The patient needs to address  chronic hypertension that has been well controlled with medicines; to address chronic  hyperlipidemia controlled with medicines as well; and to address COPD, smoking...  Smoking 2 ppd  C/o ED  F/u anxiety  Wt Readings from Last 3 Encounters:  07/13/12 159 lb (72.122 kg)  04/12/12 157 lb (71.215 kg)  03/30/12 157 lb 9.6 oz (71.487 kg)   BP Readings from Last 3 Encounters:  07/13/12 122/76  04/12/12 113/63  03/08/12 130/60      Review of Systems  Constitutional: Negative for chills and fatigue.  HENT: Positive for hearing loss.   Respiratory: Positive for cough and shortness of breath.   Cardiovascular: Negative for leg swelling.  Gastrointestinal: Negative for nausea.  Genitourinary: Positive for frequency.  Musculoskeletal: Positive for back pain.  Neurological: Negative for weakness.  Psychiatric/Behavioral: Negative for suicidal ideas and confusion.       Objective:   Physical Exam  Constitutional: He is oriented to person, place, and time. He appears well-developed.  HENT:  Mouth/Throat: Oropharynx is clear and moist.  Eyes: Conjunctivae normal are normal. Pupils are equal, round, and reactive to light.  Neck: Normal range of motion. No JVD present. No thyromegaly present.  Cardiovascular: Normal rate, regular rhythm, normal heart sounds and intact distal pulses.  Exam reveals no gallop and no friction rub.   No murmur heard. Pulmonary/Chest: Effort normal and breath sounds normal. No respiratory distress. He has no wheezes. He has no rales. He exhibits no tenderness.  Abdominal: Soft. Bowel sounds are normal. He exhibits no distension and no mass. There is no tenderness. There is no rebound and no guarding.  Genitourinary: Rectum normal, prostate normal and penis normal. Guaiac negative stool. No penile tenderness.  Musculoskeletal: Normal range of motion. He exhibits no edema and no tenderness.    Lymphadenopathy:    He has no cervical adenopathy.  Neurological: He is alert and oriented to person, place, and time. He has normal reflexes. No cranial nerve deficit. He exhibits normal muscle tone. Coordination normal.  Skin: Skin is warm and dry. No rash noted. There is erythema.       scars  Psychiatric: He has a normal mood and affect. His behavior is normal. Judgment and thought content normal.    Lab Results  Component Value Date   WBC 6.0 03/01/2012   HGB 13.2 03/01/2012   HCT 40.2 03/01/2012   PLT 189.0 03/01/2012   GLUCOSE 94 03/01/2012   CHOL 175 03/01/2012   TRIG 148.0 03/01/2012   HDL 86.50 03/01/2012   LDLDIRECT 78.9 11/28/2009   LDLCALC 59 03/01/2012   ALT 49 03/01/2012   AST 57* 03/01/2012   NA 135 03/01/2012   K 4.4 03/01/2012   CL 101 03/01/2012   CREATININE 1.0 03/01/2012   BUN 13 03/01/2012   CO2 27 03/01/2012   TSH 1.94 03/01/2012   PSA 1.03 03/01/2012          Assessment & Plan:

## 2012-07-13 NOTE — Assessment & Plan Note (Signed)
Continue with current prescription therapy as reflected on the Med list.  

## 2012-07-13 NOTE — Assessment & Plan Note (Signed)
Start Cymbalta Wean off Valium

## 2012-07-13 NOTE — Assessment & Plan Note (Signed)
Discussed.

## 2012-07-13 NOTE — Assessment & Plan Note (Signed)
Cialis prn 

## 2012-10-17 ENCOUNTER — Ambulatory Visit (INDEPENDENT_AMBULATORY_CARE_PROVIDER_SITE_OTHER): Payer: BC Managed Care – PPO | Admitting: Internal Medicine

## 2012-10-17 ENCOUNTER — Encounter: Payer: Self-pay | Admitting: Internal Medicine

## 2012-10-17 VITALS — BP 120/74 | HR 72 | Temp 98.0°F | Resp 16 | Wt 160.0 lb

## 2012-10-17 DIAGNOSIS — J449 Chronic obstructive pulmonary disease, unspecified: Secondary | ICD-10-CM

## 2012-10-17 DIAGNOSIS — F3289 Other specified depressive episodes: Secondary | ICD-10-CM

## 2012-10-17 DIAGNOSIS — F411 Generalized anxiety disorder: Secondary | ICD-10-CM

## 2012-10-17 DIAGNOSIS — F1010 Alcohol abuse, uncomplicated: Secondary | ICD-10-CM

## 2012-10-17 DIAGNOSIS — F329 Major depressive disorder, single episode, unspecified: Secondary | ICD-10-CM

## 2012-10-17 MED ORDER — DIAZEPAM 10 MG PO TABS: 5.0000 mg | ORAL_TABLET | Freq: Two times a day (BID) | ORAL | Status: DC | PRN

## 2012-10-17 MED ORDER — DULOXETINE HCL 30 MG PO CPEP: 30.0000 mg | ORAL_CAPSULE | Freq: Every day | ORAL | Status: DC

## 2012-10-17 NOTE — Assessment & Plan Note (Signed)
Continue with current prescription therapy as reflected on the Med list.  

## 2012-10-17 NOTE — Assessment & Plan Note (Signed)
4/14 1 ppd  Discussed - smoking less

## 2012-10-17 NOTE — Progress Notes (Signed)
   Subjective:    HPI   The patient needs to address  chronic hypertension that has been well controlled with medicines; to address chronic  hyperlipidemia controlled with medicines as well; and to address COPD, smoking...  Smoking 1 ppd now  C/o ED  F/u anxiety, depression  Wt Readings from Last 3 Encounters:  10/17/12 160 lb (72.576 kg)  07/13/12 159 lb (72.122 kg)  04/12/12 157 lb (71.215 kg)   BP Readings from Last 3 Encounters:  10/17/12 120/74  07/13/12 122/76  04/12/12 113/63      Review of Systems  Constitutional: Negative for chills and fatigue.  HENT: Positive for hearing loss.   Respiratory: Positive for cough and shortness of breath.   Cardiovascular: Negative for leg swelling.  Gastrointestinal: Negative for nausea.  Genitourinary: Positive for frequency.  Musculoskeletal: Positive for back pain.  Neurological: Negative for weakness.  Psychiatric/Behavioral: Negative for suicidal ideas and confusion.       Objective:   Physical Exam  Constitutional: He is oriented to person, place, and time. He appears well-developed.  HENT:  Mouth/Throat: Oropharynx is clear and moist.  Eyes: Conjunctivae are normal. Pupils are equal, round, and reactive to light.  Neck: Normal range of motion. No JVD present. No thyromegaly present.  Cardiovascular: Normal rate, regular rhythm, normal heart sounds and intact distal pulses.  Exam reveals no gallop and no friction rub.   No murmur heard. Pulmonary/Chest: Effort normal and breath sounds normal. No respiratory distress. He has no wheezes. He has no rales. He exhibits no tenderness.  Abdominal: Soft. Bowel sounds are normal. He exhibits no distension and no mass. There is no tenderness. There is no rebound and no guarding.  Genitourinary: Rectum normal, prostate normal and penis normal. Guaiac negative stool. No penile tenderness.  Musculoskeletal: Normal range of motion. He exhibits no edema and no tenderness.   Lymphadenopathy:    He has no cervical adenopathy.  Neurological: He is alert and oriented to person, place, and time. He has normal reflexes. No cranial nerve deficit. He exhibits normal muscle tone. Coordination normal.  Skin: Skin is warm and dry. No rash noted. There is erythema.  scars  Psychiatric: He has a normal mood and affect. His behavior is normal. Judgment and thought content normal.    Lab Results  Component Value Date   WBC 6.0 03/01/2012   HGB 13.2 03/01/2012   HCT 40.2 03/01/2012   PLT 189.0 03/01/2012   GLUCOSE 94 03/01/2012   CHOL 175 03/01/2012   TRIG 148.0 03/01/2012   HDL 86.50 03/01/2012   LDLDIRECT 78.9 11/28/2009   LDLCALC 59 03/01/2012   ALT 49 03/01/2012   AST 57* 03/01/2012   NA 135 03/01/2012   K 4.4 03/01/2012   CL 101 03/01/2012   CREATININE 1.0 03/01/2012   BUN 13 03/01/2012   CO2 27 03/01/2012   TSH 1.94 03/01/2012   PSA 1.03 03/01/2012          Assessment & Plan:

## 2012-10-17 NOTE — Assessment & Plan Note (Signed)
Discussed.

## 2012-11-10 ENCOUNTER — Other Ambulatory Visit: Payer: Self-pay | Admitting: Internal Medicine

## 2013-02-06 ENCOUNTER — Encounter: Payer: Self-pay | Admitting: Internal Medicine

## 2013-02-06 ENCOUNTER — Ambulatory Visit (INDEPENDENT_AMBULATORY_CARE_PROVIDER_SITE_OTHER): Payer: BC Managed Care – PPO | Admitting: Internal Medicine

## 2013-02-06 VITALS — BP 120/86 | HR 72 | Temp 97.1°F | Resp 16 | Wt 161.0 lb

## 2013-02-06 DIAGNOSIS — M1990 Unspecified osteoarthritis, unspecified site: Secondary | ICD-10-CM

## 2013-02-06 DIAGNOSIS — E559 Vitamin D deficiency, unspecified: Secondary | ICD-10-CM

## 2013-02-06 DIAGNOSIS — F329 Major depressive disorder, single episode, unspecified: Secondary | ICD-10-CM

## 2013-02-06 DIAGNOSIS — F17200 Nicotine dependence, unspecified, uncomplicated: Secondary | ICD-10-CM

## 2013-02-06 DIAGNOSIS — F3289 Other specified depressive episodes: Secondary | ICD-10-CM

## 2013-02-06 DIAGNOSIS — Z720 Tobacco use: Secondary | ICD-10-CM

## 2013-02-06 DIAGNOSIS — J449 Chronic obstructive pulmonary disease, unspecified: Secondary | ICD-10-CM

## 2013-02-06 MED ORDER — DIAZEPAM 10 MG PO TABS: 5.0000 mg | ORAL_TABLET | Freq: Two times a day (BID) | ORAL | Status: DC | PRN

## 2013-02-06 NOTE — Assessment & Plan Note (Signed)
Discussed o

## 2013-02-06 NOTE — Assessment & Plan Note (Signed)
Prn meds 

## 2013-02-06 NOTE — Progress Notes (Signed)
   Subjective:    HPI He is retiring. He stopped Cymbalta after 1 mo  The patient needs to address  chronic hypertension that has been well controlled with medicines; to address chronic  hyperlipidemia controlled with medicines as well; and to address COPD, smoking...  Smoking 1 ppd now  C/o ED  F/u anxiety, depression  Wt Readings from Last 3 Encounters:  02/06/13 161 lb (73.029 kg)  10/17/12 160 lb (72.576 kg)  07/13/12 159 lb (72.122 kg)   BP Readings from Last 3 Encounters:  02/06/13 120/86  10/17/12 120/74  07/13/12 122/76      Review of Systems  Constitutional: Negative for chills and fatigue.  HENT: Positive for hearing loss.   Respiratory: Positive for cough and shortness of breath.   Cardiovascular: Negative for leg swelling.  Gastrointestinal: Negative for nausea.  Genitourinary: Positive for frequency.  Musculoskeletal: Positive for back pain.  Neurological: Negative for weakness.  Psychiatric/Behavioral: Negative for suicidal ideas and confusion.       Objective:   Physical Exam  Constitutional: He is oriented to person, place, and time. He appears well-developed.  HENT:  Mouth/Throat: Oropharynx is clear and moist.  Eyes: Conjunctivae are normal. Pupils are equal, round, and reactive to light.  Neck: Normal range of motion. No JVD present. No thyromegaly present.  Cardiovascular: Normal rate, regular rhythm, normal heart sounds and intact distal pulses.  Exam reveals no gallop and no friction rub.   No murmur heard. Pulmonary/Chest: Effort normal and breath sounds normal. No respiratory distress. He has no wheezes. He has no rales. He exhibits no tenderness.  Abdominal: Soft. Bowel sounds are normal. He exhibits no distension and no mass. There is no tenderness. There is no rebound and no guarding.  Genitourinary: Rectum normal, prostate normal and penis normal. Guaiac negative stool. No penile tenderness.  Musculoskeletal: Normal range of motion. He  exhibits no edema and no tenderness.  Lymphadenopathy:    He has no cervical adenopathy.  Neurological: He is alert and oriented to person, place, and time. He has normal reflexes. No cranial nerve deficit. He exhibits normal muscle tone. Coordination normal.  Skin: Skin is warm and dry. No rash noted. There is erythema.  scars  Psychiatric: He has a normal mood and affect. His behavior is normal. Judgment and thought content normal.    Lab Results  Component Value Date   WBC 6.0 03/01/2012   HGB 13.2 03/01/2012   HCT 40.2 03/01/2012   PLT 189.0 03/01/2012   GLUCOSE 94 03/01/2012   CHOL 175 03/01/2012   TRIG 148.0 03/01/2012   HDL 86.50 03/01/2012   LDLDIRECT 78.9 11/28/2009   LDLCALC 59 03/01/2012   ALT 49 03/01/2012   AST 57* 03/01/2012   NA 135 03/01/2012   K 4.4 03/01/2012   CL 101 03/01/2012   CREATININE 1.0 03/01/2012   BUN 13 03/01/2012   CO2 27 03/01/2012   TSH 1.94 03/01/2012   PSA 1.03 03/01/2012          Assessment & Plan:

## 2013-02-06 NOTE — Assessment & Plan Note (Signed)
Re-start Vit D 

## 2013-02-06 NOTE — Assessment & Plan Note (Signed)
Discussed.

## 2013-02-06 NOTE — Assessment & Plan Note (Signed)
He is retiring. He stopped Cymbalta after 1 mo

## 2013-02-19 ENCOUNTER — Ambulatory Visit: Payer: BC Managed Care – PPO | Admitting: Internal Medicine

## 2013-04-14 ENCOUNTER — Ambulatory Visit (INDEPENDENT_AMBULATORY_CARE_PROVIDER_SITE_OTHER): Payer: BC Managed Care – PPO | Admitting: Family Medicine

## 2013-04-14 ENCOUNTER — Encounter: Payer: Self-pay | Admitting: Family Medicine

## 2013-04-14 VITALS — BP 120/70 | HR 79 | Temp 99.6°F | Wt 161.0 lb

## 2013-04-14 DIAGNOSIS — K047 Periapical abscess without sinus: Secondary | ICD-10-CM

## 2013-04-14 MED ORDER — HYDROCODONE-ACETAMINOPHEN 10-325 MG PO TABS: 1.0000 | ORAL_TABLET | Freq: Four times a day (QID) | ORAL | Status: DC | PRN

## 2013-04-14 NOTE — Progress Notes (Signed)
  Subjective:    Patient ID: [name], male    DOB: 1951-03-21, 62 y.o.   MRN: [id number]  HPI Here for a painful abscess in the lower jaw. This started about one week ago when he developed swelling and pain in the left lower jaw. He saw his dentist, Dr. Melynda Ripple, on Tuesday and he was diagnosed with an abscess. He was placed on Amoxicillin 500 mg tid and told to take Ibuprofen. Since then the pain has gotten worse and he has a mild fever.    Review of Systems  Constitutional: Positive for fever.  HENT: Positive for dental problem and facial swelling.   Eyes: Negative.   Respiratory: Negative.        Objective:   Physical Exam  Constitutional:  In pain  HENT:  Right Ear: External ear normal.  Left Ear: External ear normal.  Nose: Nose normal.  Tender swollen abscess on the left lower jaw at the canine  Neck: Neck supple.  Tender enlarged lymph node beneath the left mandible           Assessment & Plan:  Dental abscess. Continue the Amoxicillin. Use Ibuprofen for fever and swelling. Use Vicodin for pain. Recheck with the dentist next week

## 2013-06-02 ENCOUNTER — Emergency Department: Payer: Self-pay | Admitting: Emergency Medicine

## 2013-06-13 ENCOUNTER — Emergency Department: Payer: Self-pay | Admitting: Emergency Medicine

## 2013-08-06 ENCOUNTER — Other Ambulatory Visit: Payer: Self-pay | Admitting: Internal Medicine

## 2013-08-08 ENCOUNTER — Other Ambulatory Visit (INDEPENDENT_AMBULATORY_CARE_PROVIDER_SITE_OTHER): Payer: BC Managed Care – PPO

## 2013-08-08 ENCOUNTER — Encounter: Payer: Self-pay | Admitting: Internal Medicine

## 2013-08-08 ENCOUNTER — Ambulatory Visit (INDEPENDENT_AMBULATORY_CARE_PROVIDER_SITE_OTHER): Payer: BC Managed Care – PPO | Admitting: Internal Medicine

## 2013-08-08 VITALS — BP 112/70 | HR 76 | Resp 16 | Ht 70.0 in | Wt 167.0 lb

## 2013-08-08 DIAGNOSIS — F17200 Nicotine dependence, unspecified, uncomplicated: Secondary | ICD-10-CM

## 2013-08-08 DIAGNOSIS — F411 Generalized anxiety disorder: Secondary | ICD-10-CM

## 2013-08-08 DIAGNOSIS — J449 Chronic obstructive pulmonary disease, unspecified: Secondary | ICD-10-CM

## 2013-08-08 DIAGNOSIS — Z0000 Encounter for general adult medical examination without abnormal findings: Secondary | ICD-10-CM

## 2013-08-08 DIAGNOSIS — Z23 Encounter for immunization: Secondary | ICD-10-CM

## 2013-08-08 DIAGNOSIS — I2510 Atherosclerotic heart disease of native coronary artery without angina pectoris: Secondary | ICD-10-CM

## 2013-08-08 DIAGNOSIS — Z720 Tobacco use: Secondary | ICD-10-CM

## 2013-08-08 LAB — TSH: TSH: 2.66 u[IU]/mL (ref 0.35–5.50)

## 2013-08-08 LAB — BASIC METABOLIC PANEL
BUN: 9 mg/dL (ref 6–23)
CO2: 27 mEq/L (ref 19–32)
Calcium: 8.9 mg/dL (ref 8.4–10.5)
Chloride: 104 mEq/L (ref 96–112)
Creatinine, Ser: 0.9 mg/dL (ref 0.4–1.5)
GFR: 91.92 mL/min (ref >60.00)
Glucose, Bld: 99 mg/dL (ref 70–99)
POTASSIUM: 4.2 meq/L (ref 3.5–5.1)
SODIUM: 138 meq/L (ref 135–145)

## 2013-08-08 LAB — LDL CHOLESTEROL, DIRECT: LDL DIRECT: 74 mg/dL

## 2013-08-08 LAB — HEPATIC FUNCTION PANEL
ALK PHOS: 62 U/L (ref 39–117)
ALT: 30 U/L (ref 0–53)
AST: 32 U/L (ref 0–37)
Albumin: 4.1 g/dL (ref 3.5–5.2)
BILIRUBIN DIRECT: 0.1 mg/dL (ref 0.0–0.3)
BILIRUBIN TOTAL: 0.5 mg/dL (ref 0.3–1.2)
Total Protein: 8 g/dL (ref 6.0–8.3)

## 2013-08-08 LAB — URINALYSIS
Bilirubin Urine: NEGATIVE
Hgb urine dipstick: NEGATIVE
Ketones, ur: NEGATIVE
Leukocytes, UA: NEGATIVE
Nitrite: NEGATIVE
Total Protein, Urine: NEGATIVE
URINE GLUCOSE: NEGATIVE
Urobilinogen, UA: 0.2 (ref 0.0–1.0)
pH: 6.5 (ref 5.0–8.0)

## 2013-08-08 LAB — CBC WITH DIFFERENTIAL/PLATELET
Basophils Absolute: 0 10*3/uL (ref 0.0–0.1)
Basophils Relative: 0.4 % (ref 0.0–3.0)
EOS PCT: 0.9 % (ref 0.0–5.0)
Eosinophils Absolute: 0.1 10*3/uL (ref 0.0–0.7)
HEMATOCRIT: 43.4 % (ref 39.0–52.0)
Hemoglobin: 14.1 g/dL (ref 13.0–17.0)
LYMPHS ABS: 1.8 10*3/uL (ref 0.7–4.0)
Lymphocytes Relative: 24.6 % (ref 12.0–46.0)
MCHC: 32.5 g/dL (ref 30.0–36.0)
MCV: 83.2 fl (ref 78.0–100.0)
MONOS PCT: 10.5 % (ref 3.0–12.0)
Monocytes Absolute: 0.8 10*3/uL (ref 0.1–1.0)
Neutro Abs: 4.6 10*3/uL (ref 1.4–7.7)
Neutrophils Relative %: 63.6 % (ref 43.0–77.0)
PLATELETS: 209 10*3/uL (ref 150.0–400.0)
RBC: 5.21 Mil/uL (ref 4.22–5.81)
RDW: 19.2 % — ABNORMAL HIGH (ref 11.5–14.6)
WBC: 7.2 10*3/uL (ref 4.5–10.5)

## 2013-08-08 LAB — LIPID PANEL
CHOL/HDL RATIO: 3
Cholesterol: 196 mg/dL (ref 0–200)
HDL: 71.8 mg/dL (ref >39.00)
TRIGLYCERIDES: 259 mg/dL — AB (ref 0.0–149.0)
VLDL: 51.8 mg/dL — ABNORMAL HIGH (ref 0.0–40.0)

## 2013-08-08 LAB — HEPATITIS C ANTIBODY: HCV AB: NEGATIVE

## 2013-08-08 LAB — PSA: PSA: 1.13 ng/mL (ref 0.10–4.00)

## 2013-08-08 MED ORDER — DIAZEPAM 10 MG PO TABS: 5.0000 mg | ORAL_TABLET | Freq: Two times a day (BID) | ORAL | Status: DC | PRN

## 2013-08-08 NOTE — Assessment & Plan Note (Signed)
Continue with current prescription therapy as reflected on the Med list.  

## 2013-08-08 NOTE — Assessment & Plan Note (Addendum)
We discussed age appropriate health related issues, including available/recomended screening tests and vaccinations. We discussed a need for adhering to healthy diet and exercise. Labs/EKG were reviewed/ordered. All questions were answered. Zostavax advised Prevnar Needs eye exam

## 2013-08-08 NOTE — Assessment & Plan Note (Signed)
Discussed.

## 2013-08-08 NOTE — Progress Notes (Signed)
   Subjective:    HPI  The patient is here for a wellness exam. The patient has been doing well overall without major physical or psychological issues going on lately.  He is retired.  The patient needs to address  chronic hypertension that has been well controlled with medicines; to address chronic  hyperlipidemia controlled with medicines as well; and to address COPD, smoking...  Smoking 1 ppd now  F/u ED  F/u anxiety, depression  Wt Readings from Last 3 Encounters:  08/08/13 167 lb (75.751 kg)  04/14/13 161 lb (73.029 kg)  02/06/13 161 lb (73.029 kg)   BP Readings from Last 3 Encounters:  08/08/13 112/70  04/14/13 120/70  02/06/13 120/86      Review of Systems  Constitutional: Negative for chills and fatigue.  HENT: Positive for hearing loss.   Respiratory: Positive for cough and shortness of breath.   Cardiovascular: Negative for leg swelling.  Gastrointestinal: Negative for nausea.  Genitourinary: Positive for frequency.  Musculoskeletal: Positive for back pain.  Neurological: Negative for weakness.  Psychiatric/Behavioral: Negative for suicidal ideas and confusion.       Objective:   Physical Exam  Constitutional: He is oriented to person, place, and time. He appears well-developed.  HENT:  Mouth/Throat: Oropharynx is clear and moist.  Eyes: Conjunctivae are normal. Pupils are equal, round, and reactive to light.  Neck: Normal range of motion. No JVD present. No thyromegaly present.  Cardiovascular: Normal rate, regular rhythm, normal heart sounds and intact distal pulses.  Exam reveals no gallop and no friction rub.   No murmur heard. Pulmonary/Chest: Effort normal and breath sounds normal. No respiratory distress. He has no wheezes. He has no rales. He exhibits no tenderness.  Abdominal: Soft. Bowel sounds are normal. He exhibits no distension and no mass. There is no tenderness. There is no rebound and no guarding.  Genitourinary: Rectum normal and  penis normal. Guaiac negative stool. No penile tenderness.  Musculoskeletal: Normal range of motion. He exhibits no edema and no tenderness.  Lymphadenopathy:    He has no cervical adenopathy.  Neurological: He is alert and oriented to person, place, and time. He has normal reflexes. No cranial nerve deficit. He exhibits normal muscle tone. Coordination normal.  Skin: Skin is warm and dry. No rash noted. There is erythema.  scars  Psychiatric: He has a normal mood and affect. His behavior is normal. Judgment and thought content normal.    Lab Results  Component Value Date   WBC 6.0 03/01/2012   HGB 13.2 03/01/2012   HCT 40.2 03/01/2012   PLT 189.0 03/01/2012   GLUCOSE 94 03/01/2012   CHOL 175 03/01/2012   TRIG 148.0 03/01/2012   HDL 86.50 03/01/2012   LDLDIRECT 78.9 11/28/2009   LDLCALC 59 03/01/2012   ALT 49 03/01/2012   AST 57* 03/01/2012   NA 135 03/01/2012   K 4.4 03/01/2012   CL 101 03/01/2012   CREATININE 1.0 03/01/2012   BUN 13 03/01/2012   CO2 27 03/01/2012   TSH 1.94 03/01/2012   PSA 1.03 03/01/2012          Assessment & Plan:

## 2013-08-08 NOTE — Assessment & Plan Note (Signed)
Continue with current prn prescription therapy as reflected on the Med list.  

## 2014-02-04 ENCOUNTER — Other Ambulatory Visit: Payer: Self-pay | Admitting: Internal Medicine

## 2014-02-06 ENCOUNTER — Ambulatory Visit (INDEPENDENT_AMBULATORY_CARE_PROVIDER_SITE_OTHER)
Admission: RE | Admit: 2014-02-06 | Discharge: 2014-02-06 | Disposition: A | Discharge status: Home / Self Care | Payer: No Typology Code available for payment source | Source: Ambulatory Visit | Attending: Internal Medicine | Admitting: Internal Medicine

## 2014-02-06 ENCOUNTER — Ambulatory Visit (INDEPENDENT_AMBULATORY_CARE_PROVIDER_SITE_OTHER): Payer: No Typology Code available for payment source | Admitting: Internal Medicine

## 2014-02-06 ENCOUNTER — Encounter: Payer: Self-pay | Admitting: Internal Medicine

## 2014-02-06 VITALS — BP 125/78 | HR 62 | Temp 97.7°F | Wt 175.0 lb

## 2014-02-06 DIAGNOSIS — J449 Chronic obstructive pulmonary disease, unspecified: Secondary | ICD-10-CM

## 2014-02-06 DIAGNOSIS — N529 Male erectile dysfunction, unspecified: Secondary | ICD-10-CM

## 2014-02-06 DIAGNOSIS — F17200 Nicotine dependence, unspecified, uncomplicated: Secondary | ICD-10-CM

## 2014-02-06 DIAGNOSIS — I2510 Atherosclerotic heart disease of native coronary artery without angina pectoris: Secondary | ICD-10-CM

## 2014-02-06 DIAGNOSIS — F1010 Alcohol abuse, uncomplicated: Secondary | ICD-10-CM

## 2014-02-06 DIAGNOSIS — F411 Generalized anxiety disorder: Secondary | ICD-10-CM

## 2014-02-06 DIAGNOSIS — Z720 Tobacco use: Secondary | ICD-10-CM

## 2014-02-06 DIAGNOSIS — N5201 Erectile dysfunction due to arterial insufficiency: Secondary | ICD-10-CM

## 2014-02-06 MED ORDER — DIAZEPAM 10 MG PO TABS: 5.0000 mg | ORAL_TABLET | Freq: Two times a day (BID) | ORAL | Status: DC | PRN

## 2014-02-06 NOTE — Assessment & Plan Note (Signed)
Quit 5/15 - on a patch

## 2014-02-06 NOTE — Progress Notes (Signed)
   Subjective:    HPI    He is retired.  The patient needs to address  chronic hypertension that has been well controlled with medicines; to address chronic  hyperlipidemia controlled with medicines as well; and to address COPD, smoking...  Was smoking 1 ppd - quit in May 2015  F/u ED  F/u anxiety, depression  Wt Readings from Last 3 Encounters:  02/06/14 175 lb (79.379 kg)  08/08/13 167 lb (75.751 kg)  04/14/13 161 lb (73.029 kg)   BP Readings from Last 3 Encounters:  02/06/14 125/78  08/08/13 112/70  04/14/13 120/70      Review of Systems  Constitutional: Negative for chills and fatigue.  HENT: Positive for hearing loss.   Respiratory: Positive for cough and shortness of breath.   Cardiovascular: Negative for leg swelling.  Gastrointestinal: Negative for nausea.  Genitourinary: Positive for frequency.  Musculoskeletal: Positive for back pain.  Neurological: Negative for weakness.  Psychiatric/Behavioral: Negative for suicidal ideas and confusion.       Objective:   Physical Exam  Constitutional: He is oriented to person, place, and time. He appears well-developed.  HENT:  Mouth/Throat: Oropharynx is clear and moist.  Eyes: Conjunctivae are normal. Pupils are equal, round, and reactive to light.  Neck: Normal range of motion. No JVD present. No thyromegaly present.  Cardiovascular: Normal rate, regular rhythm, normal heart sounds and intact distal pulses.  Exam reveals no gallop and no friction rub.   No murmur heard. Pulmonary/Chest: Effort normal and breath sounds normal. No respiratory distress. He has no wheezes. He has no rales. He exhibits no tenderness.  Abdominal: Soft. Bowel sounds are normal. He exhibits no distension and no mass. There is no tenderness. There is no rebound and no guarding.  Genitourinary: Rectum normal and penis normal. Guaiac negative stool. No penile tenderness.  Musculoskeletal: Normal range of motion. He exhibits no edema and no  tenderness.  Lymphadenopathy:    He has no cervical adenopathy.  Neurological: He is alert and oriented to person, place, and time. He has normal reflexes. No cranial nerve deficit. He exhibits normal muscle tone. Coordination normal.  Skin: Skin is warm and dry. No rash noted. There is erythema.  scars  Psychiatric: He has a normal mood and affect. His behavior is normal. Judgment and thought content normal.    Lab Results  Component Value Date   WBC 7.2 08/08/2013   HGB 14.1 08/08/2013   HCT 43.4 08/08/2013   PLT 209.0 08/08/2013   GLUCOSE 99 08/08/2013   CHOL 196 08/08/2013   TRIG 259.0* 08/08/2013   HDL 71.80 08/08/2013   LDLDIRECT 74.0 08/08/2013   LDLCALC 59 03/01/2012   ALT 30 08/08/2013   AST 32 08/08/2013   NA 138 08/08/2013   K 4.2 08/08/2013   CL 104 08/08/2013   CREATININE 0.9 08/08/2013   BUN 9 08/08/2013   CO2 27 08/08/2013   TSH 2.66 08/08/2013   PSA 1.13 08/08/2013          Assessment & Plan:

## 2014-02-06 NOTE — Assessment & Plan Note (Signed)
Drinking 2d/wk now - beer

## 2014-02-06 NOTE — Assessment & Plan Note (Signed)
Continue with current prescription therapy as reflected on the Med list.  

## 2014-02-06 NOTE — Assessment & Plan Note (Addendum)
Continue with current prn prescription therapy as reflected on the Med list.  Not using Rx w/alcohol

## 2014-02-06 NOTE — Progress Notes (Signed)
Pre visit review using our clinic review tool, if applicable. No additional management support is needed unless otherwise documented below in the visit note. 

## 2014-08-09 ENCOUNTER — Other Ambulatory Visit (INDEPENDENT_AMBULATORY_CARE_PROVIDER_SITE_OTHER): Payer: 59

## 2014-08-09 ENCOUNTER — Encounter: Payer: Self-pay | Admitting: Internal Medicine

## 2014-08-09 ENCOUNTER — Ambulatory Visit (INDEPENDENT_AMBULATORY_CARE_PROVIDER_SITE_OTHER): Payer: 59 | Admitting: Internal Medicine

## 2014-08-09 VITALS — BP 140/80 | HR 66 | Temp 97.7°F | Wt 183.0 lb

## 2014-08-09 DIAGNOSIS — Z720 Tobacco use: Secondary | ICD-10-CM

## 2014-08-09 DIAGNOSIS — N5201 Erectile dysfunction due to arterial insufficiency: Secondary | ICD-10-CM

## 2014-08-09 DIAGNOSIS — F1010 Alcohol abuse, uncomplicated: Secondary | ICD-10-CM

## 2014-08-09 DIAGNOSIS — F411 Generalized anxiety disorder: Secondary | ICD-10-CM

## 2014-08-09 DIAGNOSIS — R079 Chest pain, unspecified: Secondary | ICD-10-CM | POA: Insufficient documentation

## 2014-08-09 DIAGNOSIS — J439 Emphysema, unspecified: Secondary | ICD-10-CM

## 2014-08-09 DIAGNOSIS — I25118 Atherosclerotic heart disease of native coronary artery with other forms of angina pectoris: Secondary | ICD-10-CM

## 2014-08-09 LAB — BASIC METABOLIC PANEL
BUN: 14 mg/dL (ref 6–23)
CALCIUM: 9.4 mg/dL (ref 8.4–10.5)
CO2: 27 mEq/L (ref 19–32)
Chloride: 105 mEq/L (ref 96–112)
Creatinine, Ser: 1.02 mg/dL (ref 0.40–1.50)
GFR: 78.28 mL/min (ref >60.00)
GLUCOSE: 90 mg/dL (ref 70–99)
POTASSIUM: 4 meq/L (ref 3.5–5.1)
Sodium: 138 mEq/L (ref 135–145)

## 2014-08-09 LAB — LIPID PANEL
Cholesterol: 183 mg/dL (ref 0–200)
HDL: 48.2 mg/dL (ref >39.00)
NONHDL: 134.8
Total CHOL/HDL Ratio: 4
Triglycerides: 317 mg/dL — ABNORMAL HIGH (ref 0.0–149.0)
VLDL: 63.4 mg/dL — ABNORMAL HIGH (ref 0.0–40.0)

## 2014-08-09 LAB — TSH: TSH: 4.03 u[IU]/mL (ref 0.35–4.50)

## 2014-08-09 LAB — LDL CHOLESTEROL, DIRECT: Direct LDL: 65 mg/dL

## 2014-08-09 MED ORDER — DIAZEPAM 10 MG PO TABS: 5.0000 mg | ORAL_TABLET | Freq: Two times a day (BID) | ORAL | Status: DC | PRN

## 2014-08-09 MED ORDER — NITROGLYCERIN 0.4 MG SL SUBL: 0.4000 mg | SUBLINGUAL_TABLET | SUBLINGUAL | Status: DC | PRN

## 2014-08-09 NOTE — Assessment & Plan Note (Signed)
Quit on 12/15/14

## 2014-08-09 NOTE — Progress Notes (Signed)
Pre visit review using our clinic review tool, if applicable. No additional management support is needed unless otherwise documented below in the visit note. 

## 2014-08-09 NOTE — Assessment & Plan Note (Addendum)
Card consult Re-start ASA NTG SL prn (interaction w/Viagra discussed - he is aware)

## 2014-08-09 NOTE — Assessment & Plan Note (Signed)
Quit on 12/15/14 He is not using diazepam w/alcohol

## 2014-08-09 NOTE — Progress Notes (Signed)
   Subjective:    HPI   C/o CP episode x few min; last episode 1 month ago. Not taking ASA... He is retired.  The patient needs to address  chronic hypertension that has been well controlled with medicines; to address chronic  hyperlipidemia controlled with medicines as well; and to address COPD, smoking...  Was smoking 1 ppd - quit again in 6/16  F/u ED  F/u anxiety, depression  Wt Readings from Last 3 Encounters:  08/09/14 183 lb (83.008 kg)  02/06/14 175 lb (79.379 kg)  08/08/13 167 lb (75.751 kg)   BP Readings from Last 3 Encounters:  08/09/14 140/80  02/06/14 125/78  08/08/13 112/70      Review of Systems  Constitutional: Negative for chills and fatigue.  HENT: Positive for hearing loss.   Respiratory: Positive for cough and shortness of breath.   Cardiovascular: Negative for leg swelling.  Gastrointestinal: Negative for nausea.  Genitourinary: Positive for frequency.  Musculoskeletal: Positive for back pain.  Neurological: Negative for weakness.  Psychiatric/Behavioral: Negative for suicidal ideas and confusion.       Objective:   Physical Exam  Constitutional: He is oriented to person, place, and time. He appears well-developed.  HENT:  Mouth/Throat: Oropharynx is clear and moist.  Eyes: Conjunctivae are normal. Pupils are equal, round, and reactive to light.  Neck: Normal range of motion. No JVD present. No thyromegaly present.  Cardiovascular: Normal rate, regular rhythm, normal heart sounds and intact distal pulses.  Exam reveals no gallop and no friction rub.   No murmur heard. Pulmonary/Chest: Effort normal and breath sounds normal. No respiratory distress. He has no wheezes. He has no rales. He exhibits no tenderness.  Abdominal: Soft. Bowel sounds are normal. He exhibits no distension and no mass. There is no tenderness. There is no rebound and no guarding.  Genitourinary: Rectum normal and penis normal. Guaiac negative stool. No penile tenderness.   Musculoskeletal: Normal range of motion. He exhibits no edema or tenderness.  Lymphadenopathy:    He has no cervical adenopathy.  Neurological: He is alert and oriented to person, place, and time. He has normal reflexes. No cranial nerve deficit. He exhibits normal muscle tone. Coordination normal.  Skin: Skin is warm and dry. No rash noted. There is erythema.  scars  Psychiatric: He has a normal mood and affect. His behavior is normal. Judgment and thought content normal.    Lab Results  Component Value Date   WBC 7.2 08/08/2013   HGB 14.1 08/08/2013   HCT 43.4 08/08/2013   PLT 209.0 08/08/2013   GLUCOSE 99 08/08/2013   CHOL 196 08/08/2013   TRIG 259.0* 08/08/2013   HDL 71.80 08/08/2013   LDLDIRECT 74.0 08/08/2013   LDLCALC 59 03/01/2012   ALT 30 08/08/2013   AST 32 08/08/2013   NA 138 08/08/2013   K 4.2 08/08/2013   CL 104 08/08/2013   CREATININE 0.9 08/08/2013   BUN 9 08/08/2013   CO2 27 08/08/2013   TSH 2.66 08/08/2013   PSA 1.13 08/08/2013          Assessment & Plan:

## 2014-08-09 NOTE — Assessment & Plan Note (Signed)
(  interaction w/Viagra discussed - he is aware)

## 2014-08-09 NOTE — Assessment & Plan Note (Signed)
Drinking much less

## 2014-08-09 NOTE — Assessment & Plan Note (Addendum)
Card ref NTG SL prn (interaction w/Viagra discussed - he is aware)

## 2014-10-01 ENCOUNTER — Encounter: Payer: Self-pay | Admitting: Interventional Cardiology

## 2014-10-01 ENCOUNTER — Ambulatory Visit (INDEPENDENT_AMBULATORY_CARE_PROVIDER_SITE_OTHER): Payer: 59 | Admitting: Interventional Cardiology

## 2014-10-01 VITALS — BP 110/72 | HR 75 | Ht 70.0 in | Wt 181.0 lb

## 2014-10-01 DIAGNOSIS — I25118 Atherosclerotic heart disease of native coronary artery with other forms of angina pectoris: Secondary | ICD-10-CM | POA: Diagnosis not present

## 2014-10-01 DIAGNOSIS — R079 Chest pain, unspecified: Secondary | ICD-10-CM | POA: Diagnosis not present

## 2014-10-01 DIAGNOSIS — E782 Mixed hyperlipidemia: Secondary | ICD-10-CM | POA: Diagnosis not present

## 2014-10-01 DIAGNOSIS — I252 Old myocardial infarction: Secondary | ICD-10-CM | POA: Diagnosis not present

## 2014-10-01 NOTE — Progress Notes (Signed)
Patient ID: [name], male   DOB: [birth year], 64 y.o.   MRN: [id number]     Cardiology Office Note   Date:  10/01/2014   ID:  [name], DOB 1950/07/08, MRN [id number]  PCP:  Walker Kehr, MD  Cardiologist:   Jettie Booze., MD   Chest pain    History of Present Illness: [name] is a 64 y.o. male who presents for evaluation of chest pain.  He had an MI in June 2002.  He had an intervention , ? Rotoblator.    Repeat cath was done 2-3 years later and then he had no further CAD or intervention a tthat time.  He states the moderate disease was gone.  He had not been taking his aspirin regularly for a few years.  He noted that between November 2015- Feb 2016, he would have chest pain once a week.  After a few episodes, dizziness also occurred at separate times.  Dizziness would last up to 10 minutes.  CP would last up to 5 minutes.  He did not have NTG.    He retired a few years a go and retired.  He has put on weight since then.  He quit smoking in June 2015 and gained 25 lbs since that time.    Splitting wood is his most strenuous activity.  No chest pain with that but he would have DOE.  He hauls wood as well.  He stopped because it became too difficult.   2003, he had a chemical stress test that was negative for ischemia.   Sx have resolved at this point.  He is concerned that he could have a false positive stress test.  He is off the statins due to muscle pains.  He has had elevated TG.   He has stopped taking his fish oil.       Past Medical History  Diagnosis Date  . Anxiety   . Depression   . ED (erectile dysfunction)   . Hyperlipidemia   . OA (osteoarthritis)   . Allergy to bee sting   . Dermatitis due to sun   . CAD (coronary artery disease)     statin intolerant  . MI (myocardial infarction) 2002  . Vitamin D deficiency 2008  . Tobacco abuse   . Cyst     Past Surgical History  Procedure Laterality Date  . Cardiac catherization   07-14-01  . Stress cardiolite  06-30-01  . Esophagogastroduodenoscopy  10-06-01     Current Outpatient Prescriptions  Medication Sig Dispense Refill  . aspirin 81 MG tablet Take 81 mg by mouth daily.      . cholecalciferol (VITAMIN D) 1000 UNITS tablet Take 2,000 Units by mouth daily.     . diazepam (VALIUM) 10 MG tablet Take 0.5-1 tablets (5-10 mg total) by mouth 2 (two) times daily as needed. 180 tablet 1  . DULoxetine (CYMBALTA) 30 MG capsule Take 1 capsule (30 mg total) by mouth daily. 90 capsule 2  . loratadine (CLARITIN) 10 MG tablet Take 10 mg by mouth daily.      . nitroGLYCERIN (NITROSTAT) 0.4 MG SL tablet Place 1 tablet (0.4 mg total) under the tongue every 5 (five) minutes as needed for chest pain. 20 tablet 3  . sildenafil (VIAGRA) 100 MG tablet Take 100 mg by mouth daily as needed.       No current facility-administered medications for this visit.    Allergies:   Atorvastatin  Social History:  The patient  reports that he has quit smoking. His smoking use included Cigarettes. He smoked 2.00 packs per day. He quit smokeless tobacco use about 9 months ago. He reports that he drinks alcohol. He reports that he uses illicit drugs.   Family History:  The patient's family history includes Cancer in his brother; Heart disease in his father. There is no history of Colon cancer, Esophageal cancer, Rectal cancer, or Stomach cancer.    ROS:  Please see the history of present illness.   Otherwise, review of systems are positive for chest pain.   All other systems are reviewed and negative.    PHYSICAL EXAM: VS:  BP 110/72 mmHg  Pulse 75  Ht 5\' 10"  (1.778 m)  Wt 181 lb (82.101 kg)  BMI 25.97 kg/m2 , BMI Body mass index is 25.97 kg/(m^2). GEN: Well nourished, well developed, in no acute distress HEENT: normal Neck: no JVD, carotid bruits, or masses Cardiac: RRR; no murmurs, rubs, or gallops,no edema  Respiratory:  clear to auscultation bilaterally, normal work of breathing GI:  soft, nontender, nondistended, + BS MS: no deformity or atrophy Skin: warm and dry, no rash Neuro:  Strength and sensation are intact Psych: euthymic mood, full affect   EKG:  EKG is ordered today. The ekg ordered today demonstrates  Normal sinus rhythm , no significant ST segment changes, prolonged PR interval   Recent Labs: 08/09/2014: BUN 14; Creatinine 1.02; Potassium 4.0; Sodium 138; TSH 4.03    Lipid Panel    Component Value Date/Time   CHOL 183 08/09/2014 0829   TRIG 317.0* 08/09/2014 0829   HDL 48.20 08/09/2014 0829   CHOLHDL 4 08/09/2014 0829   VLDL 63.4* 08/09/2014 0829   LDLCALC 59 03/01/2012 0734   LDLDIRECT 65.0 08/09/2014 0829      Wt Readings from Last 3 Encounters:  10/01/14 181 lb (82.101 kg)  08/09/14 183 lb (83.008 kg)  02/06/14 175 lb (79.379 kg)      Other studies Reviewed: Additional studies/ records that were reviewed today include:  Nuclear stress test from 2003. Review of the above records demonstrates:  No ischemia   ASSESSMENT AND PLAN:  1.   MI/CAD: he had symptoms concerning for angina more than a month ago. They have resolved. He is remaining fairly active. He continues to exercise and does not have symptoms. Will plan for exercise treadmill test given that he has high risk for CAD. He is aware that if he has to use more nitroglycerin , he should let us know. I would have a low threshold for cardiac cath if he did start having more angina. I congratulated him for stopping smoking. He has gained some weight since he stop smoking. He is given a try to lose some of his weight. Continue aspirin 81 mg daily.   Given that he had a false positive nuclear study in the past, he is wary of having too much testing, but is willing to do the GXT.   2.   Restart fish oil. He will have blood checked with his primary care doctor.   3.   I will see him back in a few months  After the exercise treadmill test.   Current medicines are reviewed at length with  the patient today.  The patient has concerns regarding medicines.  The following changes have been made:  Start fish oil at prior home dose  Labs/ tests ordered today include: GXT  Orders Placed This Encounter  Procedures  .  EKG 12-Lead  . Exercise tolerance test     Disposition:   FU with me after GXT   Teresita Madura., MD  10/01/2014 1:43 PM    [facility] [place], [place], [place]  [address] Phone: [phone]; Fax: [phone]

## 2014-10-01 NOTE — Patient Instructions (Signed)
**Note De-Identified  Obfuscation** Your physician recommends that you continue on your current medications as directed. Please refer to the Current Medication list given to you today.  Your physician has requested that you have an exercise tolerance test. For further information please visit [email or web address]. Please also follow instruction sheet, as given.  Your physician wants you to follow-up in: 6 months. You will receive a reminder letter in the mail two months in advance. If you don't receive a letter, please call our office to schedule the follow-up appointment.

## 2014-10-15 ENCOUNTER — Telehealth (HOSPITAL_COMMUNITY): Payer: Self-pay | Admitting: *Deleted

## 2014-11-05 ENCOUNTER — Encounter (HOSPITAL_COMMUNITY): Payer: 59

## 2014-11-06 ENCOUNTER — Telehealth (HOSPITAL_COMMUNITY): Payer: Self-pay

## 2014-11-06 NOTE — Telephone Encounter (Signed)
Encounter complete. 

## 2014-11-07 ENCOUNTER — Ambulatory Visit: Payer: 59 | Admitting: Internal Medicine

## 2014-11-08 ENCOUNTER — Ambulatory Visit (HOSPITAL_COMMUNITY)
Admission: RE | Admit: 2014-11-08 | Discharge: 2014-11-08 | Disposition: A | Discharge status: Home / Self Care | Payer: 59 | Source: Ambulatory Visit | Attending: Cardiology | Admitting: Cardiology

## 2014-11-08 ENCOUNTER — Encounter (HOSPITAL_COMMUNITY): Payer: 59

## 2014-11-08 DIAGNOSIS — R079 Chest pain, unspecified: Secondary | ICD-10-CM | POA: Diagnosis not present

## 2014-11-11 LAB — EXERCISE TOLERANCE TEST
CHL CUP MPHR: 157 {beats}/min
CHL RATE OF PERCEIVED EXERTION: 25440
CSEPEW: 9.3 METS
CSEPHR: 87 %
CSEPPHR: 137 {beats}/min
Exercise duration (min): 7 min
Exercise duration (sec): 30 s
Rest HR: 78 {beats}/min

## 2014-11-12 ENCOUNTER — Encounter (HOSPITAL_COMMUNITY): Payer: Self-pay | Admitting: *Deleted

## 2015-02-06 ENCOUNTER — Other Ambulatory Visit: Payer: Self-pay | Admitting: Internal Medicine

## 2015-02-06 NOTE — Telephone Encounter (Signed)
Rf phoned in. See meds.  

## 2015-02-06 NOTE — Telephone Encounter (Signed)
Ok to Rf in PCP's absence? Thanks! 

## 2015-03-06 ENCOUNTER — Other Ambulatory Visit: Payer: Self-pay | Admitting: Internal Medicine

## 2015-03-06 NOTE — Telephone Encounter (Signed)
Needs OV.  

## 2015-03-18 ENCOUNTER — Encounter: Payer: Self-pay | Admitting: Internal Medicine

## 2015-03-18 ENCOUNTER — Ambulatory Visit (INDEPENDENT_AMBULATORY_CARE_PROVIDER_SITE_OTHER): Payer: 59 | Admitting: Internal Medicine

## 2015-03-18 VITALS — BP 138/80 | HR 79 | Wt 200.0 lb

## 2015-03-18 DIAGNOSIS — N5201 Erectile dysfunction due to arterial insufficiency: Secondary | ICD-10-CM

## 2015-03-18 DIAGNOSIS — F329 Major depressive disorder, single episode, unspecified: Secondary | ICD-10-CM

## 2015-03-18 DIAGNOSIS — Z23 Encounter for immunization: Secondary | ICD-10-CM

## 2015-03-18 DIAGNOSIS — F411 Generalized anxiety disorder: Secondary | ICD-10-CM

## 2015-03-18 DIAGNOSIS — I2510 Atherosclerotic heart disease of native coronary artery without angina pectoris: Secondary | ICD-10-CM | POA: Diagnosis not present

## 2015-03-18 DIAGNOSIS — E782 Mixed hyperlipidemia: Secondary | ICD-10-CM

## 2015-03-18 DIAGNOSIS — F32A Depression, unspecified: Secondary | ICD-10-CM

## 2015-03-18 MED ORDER — DIAZEPAM 10 MG PO TABS: ORAL_TABLET | ORAL | Status: DC

## 2015-03-18 MED ORDER — MEGARED OMEGA-3 KRILL OIL 500 MG PO CAPS: 1.0000 | ORAL_CAPSULE | Freq: Every morning | ORAL | Status: DC

## 2015-03-18 NOTE — Progress Notes (Signed)
Pre visit review using our clinic review tool, if applicable. No additional management support is needed unless otherwise documented below in the visit note. 

## 2015-03-18 NOTE — Assessment & Plan Note (Signed)
Diazepam prn Chronic  Potential benefits of a long term benzodiazepines  use as well as potential risks  and complications were explained to the patient and were aknowledged. Not using Rx w/alcohol 

## 2015-03-18 NOTE — Assessment & Plan Note (Signed)
Organic, chronic  (interaction w/Viagra discussed - he is aware)

## 2015-03-18 NOTE — Progress Notes (Signed)
Subjective:  Patient ID: [name], male    DOB: [birth year]  Age: 64 y.o. MRN: [id number]  CC: No chief complaint on file.   HPI [name] presents for CAD, HTN, anxiety/depression f/u. C/o ED  Outpatient Prescriptions Prior to Visit  Medication Sig Dispense Refill  . aspirin 81 MG tablet Take 81 mg by mouth daily.      . cholecalciferol (VITAMIN D) 1000 UNITS tablet Take 2,000 Units by mouth daily.     [name] loratadine (CLARITIN) 10 MG tablet Take 10 mg by mouth daily.      . nitroGLYCERIN (NITROSTAT) 0.4 MG SL tablet Place 1 tablet (0.4 mg total) under the tongue every 5 (five) minutes as needed for chest pain. 20 tablet 3  . sildenafil (VIAGRA) 100 MG tablet Take 100 mg by mouth daily as needed.      . diazepam (VALIUM) 10 MG tablet TAKE ONE-HALF TO ONE TABLET BY MOUTH TWICE DAILY AS NEEDED 60 tablet 0  . DULoxetine (CYMBALTA) 30 MG capsule Take 1 capsule (30 mg total) by mouth daily. (Patient not taking: Reported on 03/18/2015) 90 capsule 2   No facility-administered medications prior to visit.    ROS Review of Systems  Constitutional: Negative for appetite change, fatigue and unexpected weight change.  HENT: Negative for congestion, nosebleeds, sneezing, sore throat and trouble swallowing.   Eyes: Negative for itching and visual disturbance.  Respiratory: Negative for apnea, cough, chest tightness and wheezing.   Cardiovascular: Negative for chest pain, palpitations and leg swelling.  Gastrointestinal: Negative for nausea, diarrhea, blood in stool and abdominal distention.  Genitourinary: Negative for frequency and hematuria.  Musculoskeletal: Positive for back pain and arthralgias. Negative for joint swelling, gait problem and neck pain.  Skin: Negative for rash.  Neurological: Negative for dizziness, tremors, speech difficulty, weakness and light-headedness.  Psychiatric/Behavioral: Negative for suicidal ideas, sleep disturbance, dysphoric mood, decreased  concentration and agitation. The patient is nervous/anxious.     Objective:  BP 138/80 mmHg  Pulse 79  Wt 200 lb (90.719 kg)  SpO2 95%  BP Readings from Last 3 Encounters:  03/18/15 138/80  10/01/14 110/72  08/09/14 140/80    Wt Readings from Last 3 Encounters:  03/18/15 200 lb (90.719 kg)  10/01/14 181 lb (82.101 kg)  08/09/14 183 lb (83.008 kg)    Physical Exam  Constitutional: He is oriented to person, place, and time. He appears well-developed. No distress.  NAD  HENT:  Mouth/Throat: Oropharynx is clear and moist.  Eyes: Conjunctivae are normal. Pupils are equal, round, and reactive to light.  Neck: Normal range of motion. No JVD present. No thyromegaly present.  Cardiovascular: Normal rate, regular rhythm, normal heart sounds and intact distal pulses.  Exam reveals no gallop and no friction rub.   No murmur heard. Pulmonary/Chest: Effort normal and breath sounds normal. No respiratory distress. He has no wheezes. He has no rales. He exhibits no tenderness.  Abdominal: Soft. Bowel sounds are normal. He exhibits no distension and no mass. There is no tenderness. There is no rebound and no guarding.  Musculoskeletal: Normal range of motion. He exhibits no edema or tenderness.  Lymphadenopathy:    He has no cervical adenopathy.  Neurological: He is alert and oriented to person, place, and time. He has normal reflexes. No cranial nerve deficit. He exhibits normal muscle tone. He displays a negative Romberg sign. Coordination and gait normal.  Skin: Skin is warm and dry. No rash noted.  Psychiatric:  He has a normal mood and affect. His behavior is normal. Judgment and thought content normal.  obese  Lab Results  Component Value Date   WBC 7.2 08/08/2013   HGB 14.1 08/08/2013   HCT 43.4 08/08/2013   PLT 209.0 08/08/2013   GLUCOSE 90 08/09/2014   CHOL 183 08/09/2014   TRIG 317.0* 08/09/2014   HDL 48.20 08/09/2014   LDLDIRECT 65.0 08/09/2014   LDLCALC 59 03/01/2012    ALT 30 08/08/2013   AST 32 08/08/2013   NA 138 08/09/2014   K 4.0 08/09/2014   CL 105 08/09/2014   CREATININE 1.02 08/09/2014   BUN 14 08/09/2014   CO2 27 08/09/2014   TSH 4.03 08/09/2014   PSA 1.13 08/08/2013    No results found.  Assessment & Plan:   Diagnoses and all orders for this visit:  Atherosclerosis of native coronary artery of native heart without angina pectoris -     Basic metabolic panel; Future -     CBC with Differential/Platelet; Future -     Hepatic function panel; Future -     Lipid panel; Future -     TSH; Future -     PSA; Future -     Urinalysis; Future  Mixed hyperlipidemia -     Basic metabolic panel; Future -     CBC with Differential/Platelet; Future -     Hepatic function panel; Future -     Lipid panel; Future -     TSH; Future -     PSA; Future -     Urinalysis; Future  Depression -     Basic metabolic panel; Future -     CBC with Differential/Platelet; Future -     Hepatic function panel; Future -     Lipid panel; Future -     TSH; Future -     PSA; Future -     Urinalysis; Future  Anxiety state -     Basic metabolic panel; Future -     CBC with Differential/Platelet; Future -     Hepatic function panel; Future -     Lipid panel; Future -     TSH; Future -     PSA; Future -     Urinalysis; Future  Erectile dysfunction due to arterial insufficiency -     Basic metabolic panel; Future -     CBC with Differential/Platelet; Future -     Hepatic function panel; Future -     Lipid panel; Future -     TSH; Future -     PSA; Future -     Urinalysis; Future  Need for influenza vaccination -     Flu Vaccine QUAD 36+ mos IM  Other orders -     diazepam (VALIUM) 10 MG tablet; TAKE ONE-HALF TO ONE TABLET BY MOUTH TWICE DAILY AS NEEDED -     MEGARED OMEGA-3 KRILL OIL 500 MG CAPS; Take 1 capsule by mouth every morning.  I have discontinued [name] DULoxetine. I am also having him start on [place]. Additionally,  I am having him maintain his aspirin, loratadine, sildenafil, cholecalciferol, nitroGLYCERIN, and diazepam.  Meds ordered this encounter  Medications  . diazepam (VALIUM) 10 MG tablet    Sig: TAKE ONE-HALF TO ONE TABLET BY MOUTH TWICE DAILY AS NEEDED    Dispense:  60 tablet    Refill:  3  . MEGARED OMEGA-3 KRILL OIL 500 MG CAPS    Sig: Take  1 capsule by mouth every morning.    Dispense:  100 capsule    Refill:  3     Follow-up: Return in about 3 months (around 06/17/2015) for Wellness Exam.  Walker Kehr, MD

## 2015-03-18 NOTE — Assessment & Plan Note (Signed)
Better Not taking Cymbalta

## 2015-03-18 NOTE — Assessment & Plan Note (Signed)
Statin intolerant Krill oil ASA

## 2015-03-18 NOTE — Assessment & Plan Note (Addendum)
Statin intolerant NTG SL prn (interaction w/Viagra discussed - he is aware) ASA Risks associated with treatment noncompliance were discussed. Compliance was encouraged.

## 2015-05-05 ENCOUNTER — Encounter: Payer: Self-pay | Admitting: Family

## 2015-05-05 ENCOUNTER — Ambulatory Visit (INDEPENDENT_AMBULATORY_CARE_PROVIDER_SITE_OTHER): Payer: 59 | Admitting: Family

## 2015-05-05 VITALS — BP 158/90 | HR 99 | Temp 99.3°F | Resp 18 | Ht 70.0 in | Wt 194.0 lb

## 2015-05-05 DIAGNOSIS — R059 Cough, unspecified: Secondary | ICD-10-CM

## 2015-05-05 DIAGNOSIS — R05 Cough: Secondary | ICD-10-CM | POA: Diagnosis not present

## 2015-05-05 MED ORDER — LEVOFLOXACIN 500 MG PO TABS: 500.0000 mg | ORAL_TABLET | Freq: Every day | ORAL | Status: DC

## 2015-05-05 MED ORDER — PREDNISONE 20 MG PO TABS: 20.0000 mg | ORAL_TABLET | Freq: Two times a day (BID) | ORAL | Status: DC

## 2015-05-05 NOTE — Progress Notes (Signed)
Pre visit review using our clinic review tool, if applicable. No additional management support is needed unless otherwise documented below in the visit note. 

## 2015-05-05 NOTE — Progress Notes (Signed)
Subjective:    Patient ID: [name], male    DOB: 10/14/1950, 64 y.o.   MRN: [id number]  Chief Complaint  Patient presents with  . Cough    body ache, productive cough, sore throat, eyes hurt, diarrhea a few days, started on thursday    HPI:  [name] is a 64 y.o. male who  has a past medical history of Anxiety; Depression; ED (erectile dysfunction); Hyperlipidemia; OA (osteoarthritis); Allergy to bee sting; Dermatitis due to sun; CAD (coronary artery disease); MI (myocardial infarction) ([place]) (2002); Vitamin D deficiency (2008); Tobacco abuse; and Cyst. and presents today for an acute office visit.  This is a new problem. Associated symptom of a body ache, productive cough with non-purulent sputum, sore throat, eye pain and diarrhea have been going on for about 4-5 days. Modifying factors include Theraflu, Nyquil flu/cold, and a decongestant. Reports that he felt better yesterday and feels a lot worse today.   Allergies  Allergen Reactions  . Atorvastatin     REACTION: arthralgia     Current Outpatient Prescriptions on File Prior to Visit  Medication Sig Dispense Refill  . aspirin 81 MG tablet Take 81 mg by mouth daily.      . cholecalciferol (VITAMIN D) 1000 UNITS tablet Take 2,000 Units by mouth daily.     . diazepam (VALIUM) 10 MG tablet TAKE ONE-HALF TO ONE TABLET BY MOUTH TWICE DAILY AS NEEDED 60 tablet 3  . loratadine (CLARITIN) 10 MG tablet Take 10 mg by mouth daily.      [name] MEGARED OMEGA-3 KRILL OIL 500 MG CAPS Take 1 capsule by mouth every morning. 100 capsule 3  . nitroGLYCERIN (NITROSTAT) 0.4 MG SL tablet Place 1 tablet (0.4 mg total) under the tongue every 5 (five) minutes as needed for chest pain. 20 tablet 3  . sildenafil (VIAGRA) 100 MG tablet Take 100 mg by mouth daily as needed.       No current facility-administered medications on file prior to visit.     Past Surgical History  Procedure Laterality Date  . Cardiac catherization  07-14-01  .  Stress cardiolite  06-30-01  . Esophagogastroduodenoscopy  10-06-01      Review of Systems  Constitutional: Positive for fever and chills.  HENT: Positive for congestion, sinus pressure and sore throat.   Respiratory: Positive for cough. Negative for chest tightness and shortness of breath.   Gastrointestinal: Positive for diarrhea.      Objective:    BP 158/90 mmHg  Pulse 99  Temp(Src) 99.3 F (37.4 C) (Oral)  Resp 18  Ht 5\' 10"  (1.778 m)  Wt 194 lb (87.998 kg)  BMI 27.84 kg/m2  SpO2 94% Nursing note and vital signs reviewed.  Physical Exam  Constitutional: He is oriented to person, place, and time. He appears well-developed and well-nourished. No distress.  HENT:  Right Ear: Hearing, tympanic membrane, external ear and ear canal normal.  Left Ear: Hearing, tympanic membrane, external ear and ear canal normal.  Nose: Right sinus exhibits frontal sinus tenderness. Right sinus exhibits no maxillary sinus tenderness. Left sinus exhibits frontal sinus tenderness. Left sinus exhibits no maxillary sinus tenderness.  Mouth/Throat: Uvula is midline, oropharynx is clear and moist and mucous membranes are normal.  Neck: Neck supple.  Cardiovascular: Normal rate, regular rhythm, normal heart sounds and intact distal pulses.   Pulmonary/Chest: Effort normal. He has wheezes.  Neurological: He is alert and oriented to person, place, and time.  Skin: Skin  is warm and dry.  Psychiatric: He has a normal mood and affect. His behavior is normal. Judgment and thought content normal.       Assessment & Plan:   Problem List Items Addressed This Visit      Other   COUGH - Primary    Symptoms and exam consistent with acute bronchitis but cannot fully rule out pneumonia. Start levofloxacin. Start prednisone. Continue over the counter medications as needed for symptom relief and supportive care. Follow up if symptoms worsen or fail to improve.       Relevant Medications   levofloxacin  (LEVAQUIN) 500 MG tablet   predniSONE (DELTASONE) 20 MG tablet

## 2015-05-05 NOTE — Patient Instructions (Signed)
Thank you for choosing [facility].  Summary/Instructions:  Your prescription(s) have been submitted to your pharmacy or been printed and provided for you. Please take as directed and contact our office if you believe you are having problem(s) with the medication(s) or have any questions.  If your symptoms worsen or fail to improve, please contact our office for further instruction, or in case of emergency go directly to the emergency room at the closest medical facility.   General Recommendations:    Please drink plenty of fluids.  Get plenty of rest   Sleep in humidified air  Use saline nasal sprays  Netti pot   OTC Medications:  Decongestants - helps relieve congestion   Flonase (generic fluticasone) or Nasacort (generic triamcinolone) - please make sure to use the "cross-over" technique at a 45 degree angle towards the opposite eye as opposed to straight up the nasal passageway.   Sudafed (generic pseudoephedrine - Note this is the one that is available behind the pharmacy counter); Products with phenylephrine (-PE) may also be used but is often not as effective as pseudoephedrine.   If you have HIGH BLOOD PRESSURE - Coricidin HBP; AVOID any product that is -D as this contains pseudoephedrine which may increase your blood pressure.  Afrin (oxymetazoline) every 6-8 hours for up to 3 days.   Allergies - helps relieve runny nose, itchy eyes and sneezing   Claritin (generic loratidine), Allegra (fexofenidine), or Zyrtec (generic cyrterizine) for runny nose. These medications should not cause drowsiness.  Note - Benadryl (generic diphenhydramine) may be used however may cause drowsiness  Cough -   Delsym or Robitussin (generic dextromethorphan)  Expectorants - helps loosen mucus to ease removal   Mucinex (generic guaifenesin) as directed on the package.  Headaches / General Aches   Tylenol (generic acetaminophen) - DO NOT EXCEED 3 grams (3,000 mg) in a 24  hour time period  Advil/Motrin (generic ibuprofen)   Sore Throat -   Salt water gargle   Chloraseptic (generic benzocaine) spray or lozenges / Sucrets (generic dyclonine)      

## 2015-05-05 NOTE — Assessment & Plan Note (Signed)
Symptoms and exam consistent with acute bronchitis but cannot fully rule out pneumonia. Start levofloxacin. Start prednisone. Continue over the counter medications as needed for symptom relief and supportive care. Follow up if symptoms worsen or fail to improve.

## 2015-06-17 ENCOUNTER — Other Ambulatory Visit (INDEPENDENT_AMBULATORY_CARE_PROVIDER_SITE_OTHER): Payer: 59

## 2015-06-17 ENCOUNTER — Encounter: Payer: Self-pay | Admitting: Internal Medicine

## 2015-06-17 ENCOUNTER — Ambulatory Visit (INDEPENDENT_AMBULATORY_CARE_PROVIDER_SITE_OTHER): Payer: 59 | Admitting: Internal Medicine

## 2015-06-17 VITALS — BP 160/90 | HR 69 | Ht 70.0 in | Wt 199.0 lb

## 2015-06-17 DIAGNOSIS — Z0000 Encounter for general adult medical examination without abnormal findings: Secondary | ICD-10-CM | POA: Diagnosis not present

## 2015-06-17 DIAGNOSIS — F329 Major depressive disorder, single episode, unspecified: Secondary | ICD-10-CM | POA: Diagnosis not present

## 2015-06-17 DIAGNOSIS — E782 Mixed hyperlipidemia: Secondary | ICD-10-CM

## 2015-06-17 DIAGNOSIS — I2510 Atherosclerotic heart disease of native coronary artery without angina pectoris: Secondary | ICD-10-CM

## 2015-06-17 DIAGNOSIS — F1010 Alcohol abuse, uncomplicated: Secondary | ICD-10-CM | POA: Diagnosis not present

## 2015-06-17 DIAGNOSIS — J449 Chronic obstructive pulmonary disease, unspecified: Secondary | ICD-10-CM

## 2015-06-17 DIAGNOSIS — N529 Male erectile dysfunction, unspecified: Secondary | ICD-10-CM

## 2015-06-17 DIAGNOSIS — F411 Generalized anxiety disorder: Secondary | ICD-10-CM | POA: Diagnosis not present

## 2015-06-17 DIAGNOSIS — N5201 Erectile dysfunction due to arterial insufficiency: Secondary | ICD-10-CM

## 2015-06-17 DIAGNOSIS — E559 Vitamin D deficiency, unspecified: Secondary | ICD-10-CM

## 2015-06-17 DIAGNOSIS — I119 Hypertensive heart disease without heart failure: Secondary | ICD-10-CM

## 2015-06-17 DIAGNOSIS — F32A Depression, unspecified: Secondary | ICD-10-CM

## 2015-06-17 LAB — URINALYSIS
BILIRUBIN URINE: NEGATIVE
Hgb urine dipstick: NEGATIVE
KETONES UR: NEGATIVE
LEUKOCYTES UA: NEGATIVE
Nitrite: NEGATIVE
Specific Gravity, Urine: 1.025 (ref 1.000–1.030)
Total Protein, Urine: NEGATIVE
UROBILINOGEN UA: 0.2 (ref 0.0–1.0)
Urine Glucose: NEGATIVE
pH: 6 (ref 5.0–8.0)

## 2015-06-17 LAB — CBC WITH DIFFERENTIAL/PLATELET
BASOS ABS: 0 10*3/uL (ref 0.0–0.1)
Basophils Relative: 0.7 % (ref 0.0–3.0)
EOS ABS: 0.1 10*3/uL (ref 0.0–0.7)
EOS PCT: 1 % (ref 0.0–5.0)
HCT: 40.2 % (ref 39.0–52.0)
HEMOGLOBIN: 13.2 g/dL (ref 13.0–17.0)
Lymphocytes Relative: 33.7 % (ref 12.0–46.0)
Lymphs Abs: 2.2 10*3/uL (ref 0.7–4.0)
MCHC: 32.9 g/dL (ref 30.0–36.0)
MCV: 80.4 fl (ref 78.0–100.0)
MONO ABS: 0.7 10*3/uL (ref 0.1–1.0)
Monocytes Relative: 10.4 % (ref 3.0–12.0)
Neutro Abs: 3.5 10*3/uL (ref 1.4–7.7)
Neutrophils Relative %: 54.2 % (ref 43.0–77.0)
Platelets: 206 10*3/uL (ref 150.0–400.0)
RBC: 4.99 Mil/uL (ref 4.22–5.81)
RDW: 17.8 % — ABNORMAL HIGH (ref 11.5–15.5)
WBC: 6.4 10*3/uL (ref 4.0–10.5)

## 2015-06-17 LAB — BASIC METABOLIC PANEL
BUN: 13 mg/dL (ref 6–23)
CO2: 27 meq/L (ref 19–32)
Calcium: 9.4 mg/dL (ref 8.4–10.5)
Chloride: 104 mEq/L (ref 96–112)
Creatinine, Ser: 1.04 mg/dL (ref 0.40–1.50)
GFR: 76.34 mL/min (ref >60.00)
Glucose, Bld: 101 mg/dL — ABNORMAL HIGH (ref 70–99)
Potassium: 4 mEq/L (ref 3.5–5.1)
SODIUM: 141 meq/L (ref 135–145)

## 2015-06-17 LAB — PSA: PSA: 1.67 ng/mL (ref 0.10–4.00)

## 2015-06-17 LAB — HEPATIC FUNCTION PANEL
ALK PHOS: 52 U/L (ref 39–117)
ALT: 37 U/L (ref 0–53)
AST: 33 U/L (ref 0–37)
Albumin: 4.1 g/dL (ref 3.5–5.2)
BILIRUBIN DIRECT: 0.1 mg/dL (ref 0.0–0.3)
BILIRUBIN TOTAL: 0.4 mg/dL (ref 0.2–1.2)
Total Protein: 7.7 g/dL (ref 6.0–8.3)

## 2015-06-17 LAB — LIPID PANEL
Cholesterol: 256 mg/dL — ABNORMAL HIGH (ref 0–200)
HDL: 44.3 mg/dL (ref >39.00)
Total CHOL/HDL Ratio: 6
Triglycerides: 631 mg/dL — ABNORMAL HIGH (ref 0.0–149.0)

## 2015-06-17 LAB — TSH: TSH: 4.49 u[IU]/mL (ref 0.35–4.50)

## 2015-06-17 LAB — LDL CHOLESTEROL, DIRECT: LDL DIRECT: 59 mg/dL

## 2015-06-17 MED ORDER — DIAZEPAM 10 MG PO TABS: ORAL_TABLET | ORAL | Status: DC

## 2015-06-17 MED ORDER — LOSARTAN POTASSIUM 100 MG PO TABS: 100.0000 mg | ORAL_TABLET | Freq: Every day | ORAL | Status: DC

## 2015-06-17 MED ORDER — SILDENAFIL CITRATE 100 MG PO TABS: 100.0000 mg | ORAL_TABLET | Freq: Every day | ORAL | Status: DC | PRN

## 2015-06-17 NOTE — Assessment & Plan Note (Signed)
Chronic 2016 drinks twice a week

## 2015-06-17 NOTE — Assessment & Plan Note (Signed)
Organic, chronic  (interaction w/Viagra discussed - he is aware) 

## 2015-06-17 NOTE — Assessment & Plan Note (Signed)
We discussed age appropriate health related issues, including available/recomended screening tests and vaccinations. We discussed a need for adhering to healthy diet and exercise. Labs/EKG were reviewed/ordered. All questions were answered.  Colon done in 2013

## 2015-06-17 NOTE — Assessment & Plan Note (Signed)
Start Losartan 

## 2015-06-17 NOTE — Progress Notes (Signed)
Subjective:  Patient ID: [name], male    DOB: 06-16-1951  Age: 64 y.o. MRN: [id number]  CC: Annual Exam   HPI [name] presents for a well exam. Not smoking x 1.5 years. F/u anxiety, ED, CAD.  Outpatient Prescriptions Prior to Visit  Medication Sig Dispense Refill  . aspirin 81 MG tablet Take 81 mg by mouth daily.      . cholecalciferol (VITAMIN D) 1000 UNITS tablet Take 2,000 Units by mouth daily.     [name] loratadine (CLARITIN) 10 MG tablet Take 10 mg by mouth daily.      [name] MEGARED OMEGA-3 KRILL OIL 500 MG CAPS Take 1 capsule by mouth every morning. 100 capsule 3  . nitroGLYCERIN (NITROSTAT) 0.4 MG SL tablet Place 1 tablet (0.4 mg total) under the tongue every 5 (five) minutes as needed for chest pain. 20 tablet 3  . diazepam (VALIUM) 10 MG tablet TAKE ONE-HALF TO ONE TABLET BY MOUTH TWICE DAILY AS NEEDED 60 tablet 3  . sildenafil (VIAGRA) 100 MG tablet Take 100 mg by mouth daily as needed.      [name] levofloxacin (LEVAQUIN) 500 MG tablet Take 1 tablet (500 mg total) by mouth daily. (Patient not taking: Reported on 06/17/2015) 7 tablet 0  . predniSONE (DELTASONE) 20 MG tablet Take 1 tablet (20 mg total) by mouth 2 (two) times daily with a meal. (Patient not taking: Reported on 06/17/2015) 10 tablet 0   No facility-administered medications prior to visit.    ROS Review of Systems  Constitutional: Negative for appetite change, fatigue and unexpected weight change.  HENT: Negative for congestion, nosebleeds, sneezing, sore throat and trouble swallowing.   Eyes: Negative for itching and visual disturbance.  Respiratory: Negative for cough.   Cardiovascular: Negative for chest pain, palpitations and leg swelling.  Gastrointestinal: Negative for nausea, diarrhea, blood in stool and abdominal distention.  Genitourinary: Negative for frequency and hematuria.  Musculoskeletal: Negative for back pain, joint swelling, gait problem and neck pain.  Skin: Negative for rash.    Neurological: Negative for dizziness, tremors, speech difficulty and weakness.  Psychiatric/Behavioral: Negative for sleep disturbance, dysphoric mood and agitation. The patient is not nervous/anxious.     Objective:  BP 160/90 mmHg  Pulse 69  Ht 5\' 10"  (1.778 m)  Wt 199 lb (90.266 kg)  BMI 28.55 kg/m2  SpO2 96%  BP Readings from Last 3 Encounters:  06/17/15 160/90  05/05/15 158/90  03/18/15 138/80    Wt Readings from Last 3 Encounters:  06/17/15 199 lb (90.266 kg)  05/05/15 194 lb (87.998 kg)  03/18/15 200 lb (90.719 kg)    Physical Exam  Constitutional: He is oriented to person, place, and time. He appears well-developed and well-nourished. No distress.  HENT:  Head: Normocephalic and atraumatic.  Right Ear: External ear normal.  Left Ear: External ear normal.  Nose: Nose normal.  Mouth/Throat: Oropharynx is clear and moist. No oropharyngeal exudate.  Eyes: Conjunctivae and EOM are normal. Pupils are equal, round, and reactive to light. Right eye exhibits no discharge. Left eye exhibits no discharge. No scleral icterus.  Neck: Normal range of motion. Neck supple. No JVD present. No tracheal deviation present. No thyromegaly present.  Cardiovascular: Normal rate, regular rhythm, normal heart sounds and intact distal pulses.  Exam reveals no gallop and no friction rub.   No murmur heard. Pulmonary/Chest: Effort normal and breath sounds normal. No stridor. No respiratory distress. He has no wheezes. He has no rales.  He exhibits no tenderness.  Abdominal: Soft. Bowel sounds are normal. He exhibits no distension and no mass. There is no tenderness. There is no rebound and no guarding.  Genitourinary: Penis normal.  Musculoskeletal: Normal range of motion. He exhibits no edema or tenderness.  Lymphadenopathy:    He has no cervical adenopathy.  Neurological: He is alert and oriented to person, place, and time. He has normal reflexes. No cranial nerve deficit. He exhibits normal  muscle tone. Coordination normal.  Skin: Skin is warm and dry. No rash noted. He is not diaphoretic. No erythema. No pallor.  Psychiatric: He has a normal mood and affect. His behavior is normal. Judgment and thought content normal.  pt declined rectal/prostate exam  Lab Results  Component Value Date   WBC 7.2 08/08/2013   HGB 14.1 08/08/2013   HCT 43.4 08/08/2013   PLT 209.0 08/08/2013   GLUCOSE 90 08/09/2014   CHOL 183 08/09/2014   TRIG 317.0* 08/09/2014   HDL 48.20 08/09/2014   LDLDIRECT 65.0 08/09/2014   LDLCALC 59 03/01/2012   ALT 30 08/08/2013   AST 32 08/08/2013   NA 138 08/09/2014   K 4.0 08/09/2014   CL 105 08/09/2014   CREATININE 1.02 08/09/2014   BUN 14 08/09/2014   CO2 27 08/09/2014   TSH 4.03 08/09/2014   PSA 1.13 08/08/2013    No results found.  Assessment & Plan:   [name] was seen today for annual exam.  Diagnoses and all orders for this visit:  Well adult exam  Atherosclerosis of native coronary artery of native heart without angina pectoris  Chronic obstructive pulmonary disease, unspecified COPD type ([place])  Alcohol abuse  Erectile dysfunction, unspecified erectile dysfunction type  Vitamin D deficiency  Hypertensive heart disease without heart failure  Other orders -     diazepam (VALIUM) 10 MG tablet; TAKE ONE-HALF TO ONE TABLET BY MOUTH TWICE DAILY AS NEEDED -     losartan (COZAAR) 100 MG tablet; Take 1 tablet (100 mg total) by mouth daily. -     sildenafil (VIAGRA) 100 MG tablet; Take 1 tablet (100 mg total) by mouth daily as needed.   I have discontinued Mr. Moody levofloxacin and predniSONE. I have also changed his sildenafil. Additionally, I am having him start on losartan. Lastly, I am having him maintain his aspirin, loratadine, cholecalciferol, nitroGLYCERIN, MEGARED OMEGA-3 KRILL OIL, and diazepam.  Meds ordered this encounter  Medications  . diazepam (VALIUM) 10 MG tablet    Sig: TAKE ONE-HALF TO ONE TABLET BY MOUTH TWICE  DAILY AS NEEDED    Dispense:  60 tablet    Refill:  3  . losartan (COZAAR) 100 MG tablet    Sig: Take 1 tablet (100 mg total) by mouth daily.    Dispense:  30 tablet    Refill:  11  . sildenafil (VIAGRA) 100 MG tablet    Sig: Take 1 tablet (100 mg total) by mouth daily as needed.    Dispense:  30 tablet    Refill:  3     Follow-up: Return in about 3 months (around 09/15/2015) for a follow-up visit.  Walker Kehr, MD

## 2015-06-17 NOTE — Progress Notes (Signed)
Pre visit review using our clinic review tool, if applicable. No additional management support is needed unless otherwise documented below in the visit note. 

## 2015-06-17 NOTE — Assessment & Plan Note (Signed)
Not smoking x 1.5 years

## 2015-06-17 NOTE — Assessment & Plan Note (Signed)
Chronic  MI 2002 Statin intolerant NTG SL prn (interaction w/Viagra discussed - he is aware) ASA Risks associated with treatment noncompliance were discussed. Compliance was encouraged.

## 2015-06-17 NOTE — Assessment & Plan Note (Signed)
Vit D 

## 2015-06-18 ENCOUNTER — Telehealth: Payer: Self-pay | Admitting: Internal Medicine

## 2015-06-18 MED ORDER — ROSUVASTATIN CALCIUM 10 MG PO TABS: 10.0000 mg | ORAL_TABLET | Freq: Every day | ORAL | Status: DC

## 2015-06-18 NOTE — Telephone Encounter (Signed)
Please prescribe crestor, thanks

## 2015-06-18 NOTE — Telephone Encounter (Signed)
Done. Thx.

## 2015-06-18 NOTE — Telephone Encounter (Signed)
Informed pt of labs and he is willing to try Crestor. Can you please call him when the prescription is sent Pharmacy is [facility] on [address].

## 2015-06-19 NOTE — Telephone Encounter (Signed)
Medication shows confirmed receipt at pharm, left message advising patient that crestor has been sent to [facility]

## 2015-09-16 ENCOUNTER — Ambulatory Visit (INDEPENDENT_AMBULATORY_CARE_PROVIDER_SITE_OTHER): Payer: BLUE CROSS/BLUE SHIELD | Admitting: Internal Medicine

## 2015-09-16 ENCOUNTER — Encounter: Payer: Self-pay | Admitting: Internal Medicine

## 2015-09-16 VITALS — BP 140/84 | HR 70 | Wt 203.0 lb

## 2015-09-16 DIAGNOSIS — F411 Generalized anxiety disorder: Secondary | ICD-10-CM | POA: Diagnosis not present

## 2015-09-16 DIAGNOSIS — K909 Intestinal malabsorption, unspecified: Secondary | ICD-10-CM | POA: Diagnosis not present

## 2015-09-16 DIAGNOSIS — E559 Vitamin D deficiency, unspecified: Secondary | ICD-10-CM

## 2015-09-16 DIAGNOSIS — E785 Hyperlipidemia, unspecified: Secondary | ICD-10-CM

## 2015-09-16 DIAGNOSIS — I119 Hypertensive heart disease without heart failure: Secondary | ICD-10-CM

## 2015-09-16 DIAGNOSIS — R197 Diarrhea, unspecified: Secondary | ICD-10-CM

## 2015-09-16 DIAGNOSIS — E782 Mixed hyperlipidemia: Secondary | ICD-10-CM

## 2015-09-16 MED ORDER — DIAZEPAM 10 MG PO TABS: ORAL_TABLET | ORAL | Status: DC

## 2015-09-16 NOTE — Assessment & Plan Note (Signed)
On Losartan 

## 2015-09-16 NOTE — Assessment & Plan Note (Signed)
3/17 lactose intolerant  Lactaid  Milk free trial (no milk, ice cream, cheese and yogurt) for 4-6 weeks. OK to use almond, coconut, rice or soy milk. "Almond breeze" brand tastes good.

## 2015-09-16 NOTE — Progress Notes (Signed)
Subjective:  Patient ID: [name], male    DOB: 1951-04-23  Age: 65 y.o. MRN: [id number]  CC: No chief complaint on file.   HPI [name] presents for allergies, HTN, ED, anxiety f/u. C/o diarrhea after eating serial  Outpatient Prescriptions Prior to Visit  Medication Sig Dispense Refill  . aspirin 81 MG tablet Take 81 mg by mouth daily.      . cholecalciferol (VITAMIN D) 1000 UNITS tablet Take 2,000 Units by mouth daily.     [name] loratadine (CLARITIN) 10 MG tablet Take 10 mg by mouth daily.      [name] losartan (COZAAR) 100 MG tablet Take 1 tablet (100 mg total) by mouth daily. 30 tablet 11  . MEGARED OMEGA-3 KRILL OIL 500 MG CAPS Take 1 capsule by mouth every morning. 100 capsule 3  . nitroGLYCERIN (NITROSTAT) 0.4 MG SL tablet Place 1 tablet (0.4 mg total) under the tongue every 5 (five) minutes as needed for chest pain. 20 tablet 3  . rosuvastatin (CRESTOR) 10 MG tablet Take 1 tablet (10 mg total) by mouth daily. 30 tablet 11  . sildenafil (VIAGRA) 100 MG tablet Take 1 tablet (100 mg total) by mouth daily as needed. 30 tablet 3  . diazepam (VALIUM) 10 MG tablet TAKE ONE-HALF TO ONE TABLET BY MOUTH TWICE DAILY AS NEEDED 60 tablet 3   No facility-administered medications prior to visit.    ROS Review of Systems  Constitutional: Negative for appetite change, fatigue and unexpected weight change.  HENT: Negative for congestion, nosebleeds, sneezing, sore throat and trouble swallowing.   Eyes: Negative for itching and visual disturbance.  Respiratory: Negative for cough.   Cardiovascular: Negative for chest pain, palpitations and leg swelling.  Gastrointestinal: Positive for diarrhea. Negative for nausea, blood in stool and abdominal distention.  Genitourinary: Negative for frequency and hematuria.  Musculoskeletal: Negative for back pain, joint swelling, gait problem and neck pain.  Skin: Negative for rash.  Neurological: Negative for dizziness, tremors, speech difficulty  and weakness.  Psychiatric/Behavioral: Negative for sleep disturbance, dysphoric mood and agitation. The patient is nervous/anxious.     Objective:  BP 140/84 mmHg  Pulse 70  Wt 203 lb (92.08 kg)  SpO2 97%  BP Readings from Last 3 Encounters:  09/16/15 140/84  06/17/15 160/90  05/05/15 158/90    Wt Readings from Last 3 Encounters:  09/16/15 203 lb (92.08 kg)  06/17/15 199 lb (90.266 kg)  05/05/15 194 lb (87.998 kg)    Physical Exam  Constitutional: He is oriented to person, place, and time. He appears well-developed. No distress.  NAD  HENT:  Mouth/Throat: Oropharynx is clear and moist.  Eyes: Conjunctivae are normal. Pupils are equal, round, and reactive to light.  Neck: Normal range of motion. No JVD present. No thyromegaly present.  Cardiovascular: Normal rate, regular rhythm, normal heart sounds and intact distal pulses.  Exam reveals no gallop and no friction rub.   No murmur heard. Pulmonary/Chest: Effort normal and breath sounds normal. No respiratory distress. He has no wheezes. He has no rales. He exhibits no tenderness.  Abdominal: Soft. Bowel sounds are normal. He exhibits no distension and no mass. There is no tenderness. There is no rebound and no guarding.  Musculoskeletal: Normal range of motion. He exhibits tenderness. He exhibits no edema.  Lymphadenopathy:    He has no cervical adenopathy.  Neurological: He is alert and oriented to person, place, and time. He has normal reflexes. No cranial nerve deficit.  He exhibits normal muscle tone. He displays a negative Romberg sign. Coordination and gait normal.  Skin: Skin is warm and dry. No rash noted.  Psychiatric: He has a normal mood and affect. His behavior is normal. Judgment and thought content normal.    Lab Results  Component Value Date   WBC 6.4 06/17/2015   HGB 13.2 06/17/2015   HCT 40.2 06/17/2015   PLT 206.0 06/17/2015   GLUCOSE 101* 06/17/2015   CHOL 256* 06/17/2015   TRIG * 06/17/2015     631.0 Triglyceride is over 400; calculations on Lipids are invalid.   HDL 44.30 06/17/2015   LDLDIRECT 59.0 06/17/2015   LDLCALC 59 03/01/2012   ALT 37 06/17/2015   AST 33 06/17/2015   NA 141 06/17/2015   K 4.0 06/17/2015   CL 104 06/17/2015   CREATININE 1.04 06/17/2015   BUN 13 06/17/2015   CO2 27 06/17/2015   TSH 4.49 06/17/2015   PSA 1.67 06/17/2015    No results found.  Assessment & Plan:   Diagnoses and all orders for this visit:  Diarrhea due to malabsorption  Hypertensive heart disease without heart failure -     Basic metabolic panel; Future  Anxiety state  Vitamin D deficiency  Dyslipidemia -     Hepatic function panel; Future -     Lipid panel; Future -     Basic metabolic panel; Future  Other orders -     diazepam (VALIUM) 10 MG tablet; TAKE ONE-HALF TO ONE TABLET BY MOUTH TWICE DAILY AS NEEDED  I am having Mr. Stellmacher maintain his aspirin, loratadine, cholecalciferol, nitroGLYCERIN, MEGARED OMEGA-3 KRILL OIL, losartan, sildenafil, rosuvastatin, and diazepam.  Meds ordered this encounter  Medications  . diazepam (VALIUM) 10 MG tablet    Sig: TAKE ONE-HALF TO ONE TABLET BY MOUTH TWICE DAILY AS NEEDED    Dispense:  60 tablet    Refill:  3     Follow-up: Return in about 3 months (around 12/17/2015) for a follow-up visit.  Walker Kehr, MD

## 2015-09-16 NOTE — Progress Notes (Signed)
Pre visit review using our clinic review tool, if applicable. No additional management support is needed unless otherwise documented below in the visit note. 

## 2015-09-16 NOTE — Assessment & Plan Note (Signed)
On Crestor - labs

## 2015-09-16 NOTE — Assessment & Plan Note (Signed)
Diazepam prn Chronic  Potential benefits of a long term benzodiazepines  use as well as potential risks  and complications were explained to the patient and were aknowledged. Not using Rx w/alcohol 

## 2015-09-16 NOTE — Patient Instructions (Addendum)
.    Milk free trial (no milk, ice cream, cheese and yogurt) for 4-6 weeks. OK to use almond, coconut, rice milk. "Almond breeze" brand tastes good.  Use Lactaid with milk

## 2015-09-16 NOTE — Assessment & Plan Note (Signed)
Vit D 

## 2015-10-22 ENCOUNTER — Other Ambulatory Visit (INDEPENDENT_AMBULATORY_CARE_PROVIDER_SITE_OTHER): Payer: BLUE CROSS/BLUE SHIELD

## 2015-10-22 DIAGNOSIS — I119 Hypertensive heart disease without heart failure: Secondary | ICD-10-CM | POA: Diagnosis not present

## 2015-10-22 DIAGNOSIS — E785 Hyperlipidemia, unspecified: Secondary | ICD-10-CM

## 2015-10-22 LAB — BASIC METABOLIC PANEL
BUN: 13 mg/dL (ref 6–23)
CALCIUM: 9.2 mg/dL (ref 8.4–10.5)
CHLORIDE: 104 meq/L (ref 96–112)
CO2: 28 meq/L (ref 19–32)
CREATININE: 1.04 mg/dL (ref 0.40–1.50)
GFR: 76.26 mL/min (ref >60.00)
Glucose, Bld: 102 mg/dL — ABNORMAL HIGH (ref 70–99)
Potassium: 4.4 mEq/L (ref 3.5–5.1)
Sodium: 138 mEq/L (ref 135–145)

## 2015-10-22 LAB — HEPATIC FUNCTION PANEL
ALT: 52 U/L (ref 0–53)
AST: 44 U/L — AB (ref 0–37)
Albumin: 4.1 g/dL (ref 3.5–5.2)
Alkaline Phosphatase: 47 U/L (ref 39–117)
BILIRUBIN TOTAL: 0.5 mg/dL (ref 0.2–1.2)
Bilirubin, Direct: 0.1 mg/dL (ref 0.0–0.3)
Total Protein: 8 g/dL (ref 6.0–8.3)

## 2015-10-22 LAB — LIPID PANEL
CHOL/HDL RATIO: 3
CHOLESTEROL: 148 mg/dL (ref 0–200)
HDL: 46.2 mg/dL (ref >39.00)
NONHDL: 101.72
TRIGLYCERIDES: 213 mg/dL — AB (ref 0.0–149.0)
VLDL: 42.6 mg/dL — ABNORMAL HIGH (ref 0.0–40.0)

## 2015-10-22 LAB — LDL CHOLESTEROL, DIRECT: LDL DIRECT: 42 mg/dL

## 2016-01-23 ENCOUNTER — Other Ambulatory Visit: Payer: Self-pay | Admitting: Internal Medicine

## 2016-01-26 NOTE — Telephone Encounter (Signed)
Left msg on triage requesting status on his Diazepam.../lmb

## 2016-01-27 NOTE — Telephone Encounter (Signed)
Called refill into [facility] had to leave on pharmacy vm. Called pt to inform refill has been call to pharmacy no answer LMOM...[name]

## 2016-02-23 ENCOUNTER — Other Ambulatory Visit: Payer: Self-pay | Admitting: Internal Medicine

## 2016-02-24 NOTE — Telephone Encounter (Signed)
sch OV 

## 2016-02-25 NOTE — Telephone Encounter (Signed)
Called refill into [facility] had to leave on pharmacy vm left md approval.../lmb

## 2016-03-23 ENCOUNTER — Other Ambulatory Visit: Payer: Self-pay | Admitting: Internal Medicine

## 2016-03-23 NOTE — Telephone Encounter (Signed)
sch ov 

## 2016-03-24 NOTE — Telephone Encounter (Signed)
Called refill into [facility] spoke w/pharmacist Lanae Crumbly gave MD response...Johny Chess

## 2016-03-27 DIAGNOSIS — R69 Illness, unspecified: Secondary | ICD-10-CM | POA: Diagnosis not present

## 2016-05-10 ENCOUNTER — Other Ambulatory Visit: Payer: Self-pay | Admitting: *Deleted

## 2016-05-10 MED ORDER — LOSARTAN POTASSIUM 100 MG PO TABS: 100.0000 mg | ORAL_TABLET | Freq: Every day | ORAL | 0 refills | Status: DC

## 2016-05-10 MED ORDER — ROSUVASTATIN CALCIUM 10 MG PO TABS: 10.0000 mg | ORAL_TABLET | Freq: Every day | ORAL | 0 refills | Status: DC

## 2016-05-20 ENCOUNTER — Telehealth: Payer: Self-pay | Admitting: *Deleted

## 2016-05-20 NOTE — Telephone Encounter (Signed)
OK to fill this prescription with additional refills x0 Sch OV Thank you!  

## 2016-05-20 NOTE — Telephone Encounter (Signed)
Rec'd fax pt requesting refill on Diazepam. Last filled 04/22/16...Johny Chess

## 2016-05-21 MED ORDER — DIAZEPAM 10 MG PO TABS: 5.0000 mg | ORAL_TABLET | Freq: Two times a day (BID) | ORAL | 0 refills | Status: DC | PRN

## 2016-05-21 NOTE — Telephone Encounter (Signed)
rx faxed to [facility].  

## 2016-05-21 NOTE — Telephone Encounter (Signed)
rx printed.  Can you call pt at your convenience to schedule OV?

## 2016-05-21 NOTE — Telephone Encounter (Signed)
Pt has OV 06/21/16.

## 2016-05-31 DIAGNOSIS — E785 Hyperlipidemia, unspecified: Secondary | ICD-10-CM | POA: Diagnosis not present

## 2016-05-31 DIAGNOSIS — Z0000 Encounter for general adult medical examination without abnormal findings: Secondary | ICD-10-CM | POA: Diagnosis not present

## 2016-05-31 DIAGNOSIS — I10 Essential (primary) hypertension: Secondary | ICD-10-CM | POA: Diagnosis not present

## 2016-05-31 DIAGNOSIS — Z6839 Body mass index (BMI) 39.0-39.9, adult: Secondary | ICD-10-CM | POA: Diagnosis not present

## 2016-05-31 DIAGNOSIS — J449 Chronic obstructive pulmonary disease, unspecified: Secondary | ICD-10-CM | POA: Diagnosis not present

## 2016-06-21 ENCOUNTER — Ambulatory Visit (INDEPENDENT_AMBULATORY_CARE_PROVIDER_SITE_OTHER): Payer: Medicare HMO | Admitting: Internal Medicine

## 2016-06-21 ENCOUNTER — Encounter: Payer: Self-pay | Admitting: Internal Medicine

## 2016-06-21 VITALS — BP 138/88 | HR 94 | Ht 70.0 in | Wt 193.0 lb

## 2016-06-21 DIAGNOSIS — Z0000 Encounter for general adult medical examination without abnormal findings: Secondary | ICD-10-CM | POA: Diagnosis not present

## 2016-06-21 DIAGNOSIS — N320 Bladder-neck obstruction: Secondary | ICD-10-CM

## 2016-06-21 DIAGNOSIS — I2510 Atherosclerotic heart disease of native coronary artery without angina pectoris: Secondary | ICD-10-CM | POA: Diagnosis not present

## 2016-06-21 DIAGNOSIS — R69 Illness, unspecified: Secondary | ICD-10-CM | POA: Diagnosis not present

## 2016-06-21 DIAGNOSIS — E782 Mixed hyperlipidemia: Secondary | ICD-10-CM

## 2016-06-21 DIAGNOSIS — F411 Generalized anxiety disorder: Secondary | ICD-10-CM | POA: Diagnosis not present

## 2016-06-21 DIAGNOSIS — I119 Hypertensive heart disease without heart failure: Secondary | ICD-10-CM

## 2016-06-21 MED ORDER — LOSARTAN POTASSIUM 100 MG PO TABS: 100.0000 mg | ORAL_TABLET | Freq: Every day | ORAL | 3 refills | Status: DC

## 2016-06-21 MED ORDER — DIAZEPAM 10 MG PO TABS: 5.0000 mg | ORAL_TABLET | Freq: Two times a day (BID) | ORAL | 2 refills | Status: DC | PRN

## 2016-06-21 MED ORDER — EZETIMIBE 10 MG PO TABS: 10.0000 mg | ORAL_TABLET | Freq: Every day | ORAL | 3 refills | Status: DC

## 2016-06-21 MED ORDER — NITROGLYCERIN 0.4 MG SL SUBL: 0.4000 mg | SUBLINGUAL_TABLET | SUBLINGUAL | 3 refills | Status: DC | PRN

## 2016-06-21 NOTE — Assessment & Plan Note (Signed)
Losartan 

## 2016-06-21 NOTE — Progress Notes (Signed)
Pre visit review using our clinic review tool, if applicable. No additional management support is needed unless otherwise documented below in the visit note. 

## 2016-06-21 NOTE — Assessment & Plan Note (Signed)
Chronic Diazepam prn  Potential benefits of a long term benzodiazepines  use as well as potential risks  and complications were explained to the patient and were aknowledged. Not using Rx w/alcohol

## 2016-06-21 NOTE — Assessment & Plan Note (Signed)
D/c Crestor, start Zetia

## 2016-06-21 NOTE — Patient Instructions (Signed)

## 2016-06-21 NOTE — Progress Notes (Signed)
Subjective:  Patient ID: [name], male    DOB: 1951/01/24  Age: 65 y.o. MRN: [id number]  CC: No chief complaint on file.   HPI [name] presents for a well exam. F/uCAD, HTN, anxiety, OA f/u. C/o problems w/Crestor. Not taking NTG  Outpatient Medications Prior to Visit  Medication Sig Dispense Refill  . aspirin 81 MG tablet Take 81 mg by mouth daily.      . cholecalciferol (VITAMIN D) 1000 UNITS tablet Take 2,000 Units by mouth daily.     . diazepam (VALIUM) 10 MG tablet Take 0.5-1 tablets (5-10 mg total) by mouth every 12 (twelve) hours as needed for anxiety. 60 tablet 0  . loratadine (CLARITIN) 10 MG tablet Take 10 mg by mouth daily.      [name] losartan (COZAAR) 100 MG tablet Take 1 tablet (100 mg total) by mouth daily. Yearly physical due in March must see MD for refills 90 tablet 0  . MEGARED OMEGA-3 KRILL OIL 500 MG CAPS Take 1 capsule by mouth every morning. 100 capsule 3  . nitroGLYCERIN (NITROSTAT) 0.4 MG SL tablet Place 1 tablet (0.4 mg total) under the tongue every 5 (five) minutes as needed for chest pain. 20 tablet 3  . rosuvastatin (CRESTOR) 10 MG tablet Take 1 tablet (10 mg total) by mouth daily. Yearly physical due in March must see MD for refills 90 tablet 0  . sildenafil (VIAGRA) 100 MG tablet Take 1 tablet (100 mg total) by mouth daily as needed. 30 tablet 3   No facility-administered medications prior to visit.     ROS Review of Systems  Constitutional: Negative for appetite change, fatigue and unexpected weight change.  HENT: Negative for congestion, nosebleeds, sneezing, sore throat and trouble swallowing.   Eyes: Negative for itching and visual disturbance.  Respiratory: Negative for cough.   Cardiovascular: Negative for chest pain, palpitations and leg swelling.  Gastrointestinal: Negative for abdominal distention, blood in stool, diarrhea and nausea.  Genitourinary: Negative for frequency and hematuria.  Musculoskeletal: Positive for arthralgias  and myalgias. Negative for back pain, gait problem, joint swelling and neck pain.  Skin: Negative for rash.  Neurological: Negative for dizziness, tremors, speech difficulty and weakness.  Psychiatric/Behavioral: Positive for decreased concentration. Negative for agitation, dysphoric mood and sleep disturbance. The patient is nervous/anxious.     Objective:  BP 138/88   Pulse 94   Ht 5\' 10"  (1.778 m)   Wt 193 lb (87.5 kg)   SpO2 95%   BMI 27.69 kg/m   BP Readings from Last 3 Encounters:  06/21/16 138/88  09/16/15 140/84  06/17/15 (!) 160/90    Wt Readings from Last 3 Encounters:  06/21/16 193 lb (87.5 kg)  09/16/15 203 lb (92.1 kg)  06/17/15 199 lb (90.3 kg)    Physical Exam  Constitutional: He is oriented to person, place, and time. He appears well-developed. No distress.  NAD  HENT:  Mouth/Throat: Oropharynx is clear and moist.  Eyes: Conjunctivae are normal. Pupils are equal, round, and reactive to light.  Neck: Normal range of motion. No JVD present. No thyromegaly present.  Cardiovascular: Normal rate, regular rhythm, normal heart sounds and intact distal pulses.  Exam reveals no gallop and no friction rub.   No murmur heard. Pulmonary/Chest: Effort normal and breath sounds normal. No respiratory distress. He has no wheezes. He has no rales. He exhibits no tenderness.  Abdominal: Soft. Bowel sounds are normal. He exhibits no distension and no mass. There  is no tenderness. There is no rebound and no guarding.  Musculoskeletal: Normal range of motion. He exhibits tenderness. He exhibits no edema.  Lymphadenopathy:    He has no cervical adenopathy.  Neurological: He is alert and oriented to person, place, and time. He has normal reflexes. No cranial nerve deficit. He exhibits normal muscle tone. He displays a negative Romberg sign. Coordination and gait normal.  Skin: Skin is warm and dry. No rash noted.  Psychiatric: He has a normal mood and affect. His behavior is  normal. Judgment and thought content normal.    Lab Results  Component Value Date   WBC 6.4 06/17/2015   HGB 13.2 06/17/2015   HCT 40.2 06/17/2015   PLT 206.0 06/17/2015   GLUCOSE 102 (H) 10/22/2015   CHOL 148 10/22/2015   TRIG 213.0 (H) 10/22/2015   HDL 46.20 10/22/2015   LDLDIRECT 42.0 10/22/2015   LDLCALC 59 03/01/2012   ALT 52 10/22/2015   AST 44 (H) 10/22/2015   NA 138 10/22/2015   K 4.4 10/22/2015   CL 104 10/22/2015   CREATININE 1.04 10/22/2015   BUN 13 10/22/2015   CO2 28 10/22/2015   TSH 4.49 06/17/2015   PSA 1.67 06/17/2015    No results found.  Assessment & Plan:   There are no diagnoses linked to this encounter. I am having Mr. Shrieves maintain his aspirin, loratadine, cholecalciferol, nitroGLYCERIN, MEGARED OMEGA-3 KRILL OIL, sildenafil, losartan, rosuvastatin, and diazepam.  No orders of the defined types were placed in this encounter.    Follow-up: No Follow-up on file.  Walker Kehr, MD

## 2016-06-21 NOTE — Assessment & Plan Note (Signed)
Start Zetia.

## 2016-06-21 NOTE — Assessment & Plan Note (Signed)
Here for medicare wellness/physical  Diet: heart healthy  Physical activity: not sedentary  Depression/mood screen: negative, anxious  Hearing: intact to whispered voice  Visual acuity: grossly normal, performs annual eye exam  ADLs: capable  Fall risk: low to none  Home safety: good  Cognitive evaluation: intact to orientation, naming, recall and repetition  EOL planning: adv directives, full code/ I agree  I have personally reviewed and have noted  1. The patient's medical, surgical and social history  2. Their use of alcohol, tobacco or illicit drugs  3. Their current medications and supplements  4. The patient's functional ability including ADL's, fall risks, home safety risks and hearing or visual impairment.  5. Diet and physical activities  6. Evidence for depression or mood disorders 7. The roster of all physicians providing medical care to patient - is listed in the Snapshot section of the chart and reviewed today.    Today patient counseled on age appropriate routine health concerns for screening and prevention, each reviewed and up to date or declined. Immunizations reviewed and up to date or declined. Labs ordered and reviewed. Risk factors for depression reviewed and negative. Hearing function and visual acuity are intact. ADLs screened and addressed as needed. Functional ability and level of safety reviewed and appropriate. Education, counseling and referrals performed based on assessed risks today. Patient provided with a copy of personalized plan for preventive services.

## 2016-06-22 ENCOUNTER — Other Ambulatory Visit (INDEPENDENT_AMBULATORY_CARE_PROVIDER_SITE_OTHER): Payer: Medicare HMO

## 2016-06-22 DIAGNOSIS — R7989 Other specified abnormal findings of blood chemistry: Secondary | ICD-10-CM

## 2016-06-22 DIAGNOSIS — E559 Vitamin D deficiency, unspecified: Secondary | ICD-10-CM | POA: Diagnosis not present

## 2016-06-22 LAB — CBC WITH DIFFERENTIAL/PLATELET
BASOS ABS: 0 10*3/uL (ref 0.0–0.1)
Basophils Relative: 0.5 % (ref 0.0–3.0)
Eosinophils Absolute: 0.1 10*3/uL (ref 0.0–0.7)
Eosinophils Relative: 1 % (ref 0.0–5.0)
HEMATOCRIT: 38.7 % — AB (ref 39.0–52.0)
Hemoglobin: 12.9 g/dL — ABNORMAL LOW (ref 13.0–17.0)
LYMPHS PCT: 33.5 % (ref 12.0–46.0)
Lymphs Abs: 2.1 10*3/uL (ref 0.7–4.0)
MCHC: 33.4 g/dL (ref 30.0–36.0)
MCV: 78.4 fl (ref 78.0–100.0)
MONOS PCT: 10.7 % (ref 3.0–12.0)
Monocytes Absolute: 0.7 10*3/uL (ref 0.1–1.0)
NEUTROS ABS: 3.4 10*3/uL (ref 1.4–7.7)
Neutrophils Relative %: 54.3 % (ref 43.0–77.0)
Platelets: 202 10*3/uL (ref 150.0–400.0)
RBC: 4.94 Mil/uL (ref 4.22–5.81)
RDW: 16.7 % — ABNORMAL HIGH (ref 11.5–15.5)
WBC: 6.3 10*3/uL (ref 4.0–10.5)

## 2016-06-22 LAB — URINALYSIS, ROUTINE W REFLEX MICROSCOPIC
Bilirubin Urine: NEGATIVE
Hgb urine dipstick: NEGATIVE
Ketones, ur: NEGATIVE
Leukocytes, UA: NEGATIVE
NITRITE: NEGATIVE
PH: 7 (ref 5.0–8.0)
RBC / HPF: NONE SEEN (ref 0–?)
SPECIFIC GRAVITY, URINE: 1.015 (ref 1.000–1.030)
Total Protein, Urine: NEGATIVE
URINE GLUCOSE: NEGATIVE
Urobilinogen, UA: 0.2 (ref 0.0–1.0)
WBC, UA: NONE SEEN (ref 0–?)

## 2016-06-22 LAB — BASIC METABOLIC PANEL
BUN: 16 mg/dL (ref 6–23)
CHLORIDE: 102 meq/L (ref 96–112)
CO2: 27 meq/L (ref 19–32)
CREATININE: 1.14 mg/dL (ref 0.40–1.50)
Calcium: 9.2 mg/dL (ref 8.4–10.5)
GFR: 68.45 mL/min (ref >60.00)
Glucose, Bld: 101 mg/dL — ABNORMAL HIGH (ref 70–99)
Potassium: 4.1 mEq/L (ref 3.5–5.1)
Sodium: 137 mEq/L (ref 135–145)

## 2016-06-22 LAB — PSA: PSA: 1.49 ng/mL (ref 0.10–4.00)

## 2016-06-22 LAB — LIPID PANEL
CHOL/HDL RATIO: 4
Cholesterol: 192 mg/dL (ref 0–200)
HDL: 50.6 mg/dL (ref >39.00)
NONHDL: 141.16
TRIGLYCERIDES: 365 mg/dL — AB (ref 0.0–149.0)
VLDL: 73 mg/dL — AB (ref 0.0–40.0)

## 2016-06-22 LAB — HEPATIC FUNCTION PANEL
ALT: 37 U/L (ref 0–53)
AST: 34 U/L (ref 0–37)
Albumin: 4.2 g/dL (ref 3.5–5.2)
Alkaline Phosphatase: 48 U/L (ref 39–117)
BILIRUBIN DIRECT: 0.1 mg/dL (ref 0.0–0.3)
BILIRUBIN TOTAL: 0.4 mg/dL (ref 0.2–1.2)
TOTAL PROTEIN: 7.8 g/dL (ref 6.0–8.3)

## 2016-06-22 LAB — LDL CHOLESTEROL, DIRECT: Direct LDL: 54 mg/dL

## 2016-06-22 LAB — TSH: TSH: 4.08 u[IU]/mL (ref 0.35–4.50)

## 2016-09-05 DIAGNOSIS — R69 Illness, unspecified: Secondary | ICD-10-CM | POA: Diagnosis not present

## 2016-09-07 ENCOUNTER — Encounter: Payer: Self-pay | Admitting: Internal Medicine

## 2016-09-17 ENCOUNTER — Other Ambulatory Visit: Payer: Self-pay | Admitting: Internal Medicine

## 2016-09-21 NOTE — Telephone Encounter (Signed)
Rec'd call pt requesting status on the diazepam refill he states request was sent on last Friday 3/16. Pls advise if ok to refill...Johny Chess

## 2016-09-21 NOTE — Telephone Encounter (Signed)
OK to fill this/these prescription(s) with additional refills x0 Needs to have an OV every 3 months Thank you!  

## 2016-09-22 ENCOUNTER — Other Ambulatory Visit: Payer: Self-pay | Admitting: Internal Medicine

## 2016-09-22 NOTE — Telephone Encounter (Signed)
Called refill into [facility] had to leave on pharmacist vm...[name]

## 2016-10-20 ENCOUNTER — Other Ambulatory Visit: Payer: Self-pay | Admitting: Internal Medicine

## 2016-10-25 NOTE — Telephone Encounter (Signed)
Patient is calling about this refill. Please advise if patient is needing an appointment. Thank you.   Also patient did inform he has been buying the medication off the street, to keep from getting sick. He also stated that if he does not get his refills he could end up doing something and be on the news. He also was upset that he use to get 52months at a time and then it went to 59months. Now he has to get every 30days and he does not like that. I informed patient it is not the doctor, it is the law now.

## 2016-10-26 NOTE — Telephone Encounter (Signed)
Made patient an appointment on April 25 at 230.

## 2016-10-27 ENCOUNTER — Encounter: Payer: Self-pay | Admitting: Internal Medicine

## 2016-10-27 ENCOUNTER — Ambulatory Visit (INDEPENDENT_AMBULATORY_CARE_PROVIDER_SITE_OTHER): Payer: Medicare HMO | Admitting: Internal Medicine

## 2016-10-27 DIAGNOSIS — I252 Old myocardial infarction: Secondary | ICD-10-CM | POA: Diagnosis not present

## 2016-10-27 DIAGNOSIS — Z52008 Unspecified donor, other blood: Secondary | ICD-10-CM | POA: Diagnosis not present

## 2016-10-27 DIAGNOSIS — F411 Generalized anxiety disorder: Secondary | ICD-10-CM

## 2016-10-27 DIAGNOSIS — J159 Unspecified bacterial pneumonia: Secondary | ICD-10-CM | POA: Diagnosis not present

## 2016-10-27 DIAGNOSIS — R69 Illness, unspecified: Secondary | ICD-10-CM | POA: Diagnosis not present

## 2016-10-27 DIAGNOSIS — J449 Chronic obstructive pulmonary disease, unspecified: Secondary | ICD-10-CM

## 2016-10-27 MED ORDER — DIAZEPAM 10 MG PO TABS: ORAL_TABLET | ORAL | 2 refills | Status: DC

## 2016-10-27 NOTE — Assessment & Plan Note (Signed)
Diazepam prn  Potential benefits of a long term benzodiazepines  use as well as potential risks  and complications were explained to the patient and were aknowledged. 

## 2016-10-27 NOTE — Assessment & Plan Note (Signed)
Gives blood q 8 weeks

## 2016-10-27 NOTE — Progress Notes (Signed)
Subjective:  Patient ID: [name], male    DOB: 12/27/1950  Age: 66 y.o. MRN: [id number]  CC: No chief complaint on file.   HPI [name] presents for HTN, anxiety, blood donor anemia f/u  Outpatient Medications Prior to Visit  Medication Sig Dispense Refill  . aspirin 81 MG tablet Take 81 mg by mouth daily.      . cholecalciferol (VITAMIN D) 1000 UNITS tablet Take 2,000 Units by mouth daily.     . diazepam (VALIUM) 10 MG tablet TAKE ONE-HALF TO ONE TABLET BY MOUTH EVERY 12 HOURS AS NEEDED FOR ANXIETY 60 tablet 0  . ezetimibe (ZETIA) 10 MG tablet Take 1 tablet (10 mg total) by mouth daily. 90 tablet 3  . loratadine (CLARITIN) 10 MG tablet Take 10 mg by mouth daily.      [name] losartan (COZAAR) 100 MG tablet Take 1 tablet (100 mg total) by mouth daily. Yearly physical due in March must see MD for refills 90 tablet 3  . MEGARED OMEGA-3 KRILL OIL 500 MG CAPS Take 1 capsule by mouth every morning. 100 capsule 3  . nitroGLYCERIN (NITROSTAT) 0.4 MG SL tablet Place 1 tablet (0.4 mg total) under the tongue every 5 (five) minutes as needed for chest pain. 20 tablet 3  . sildenafil (VIAGRA) 100 MG tablet Take 1 tablet (100 mg total) by mouth daily as needed. 30 tablet 3   No facility-administered medications prior to visit.     ROS Review of Systems  Constitutional: Negative for appetite change, fatigue and unexpected weight change.  HENT: Negative for congestion, nosebleeds, sneezing, sore throat and trouble swallowing.   Eyes: Negative for itching and visual disturbance.  Respiratory: Negative for cough.   Cardiovascular: Negative for chest pain, palpitations and leg swelling.  Gastrointestinal: Negative for abdominal distention, blood in stool, diarrhea and nausea.  Genitourinary: Negative for frequency and hematuria.  Musculoskeletal: Positive for arthralgias. Negative for back pain, gait problem, joint swelling and neck pain.  Skin: Negative for rash.  Neurological:  Negative for dizziness, tremors, speech difficulty and weakness.  Psychiatric/Behavioral: Negative for agitation, dysphoric mood and sleep disturbance. The patient is nervous/anxious.     Objective:  BP 118/72 (BP Location: Right Arm, Patient Position: Sitting, Cuff Size: Large)   Pulse 94   Temp 97.7 F (36.5 C) (Oral)   Ht 5\' 10"  (1.778 m)   Wt 197 lb 1.9 oz (89.4 kg)   SpO2 98%   BMI 28.28 kg/m   BP Readings from Last 3 Encounters:  10/27/16 118/72  06/21/16 138/88  09/16/15 140/84    Wt Readings from Last 3 Encounters:  10/27/16 197 lb 1.9 oz (89.4 kg)  06/21/16 193 lb (87.5 kg)  09/16/15 203 lb (92.1 kg)    Physical Exam  Constitutional: He is oriented to person, place, and time. He appears well-developed. No distress.  NAD  HENT:  Mouth/Throat: Oropharynx is clear and moist.  Eyes: Conjunctivae are normal. Pupils are equal, round, and reactive to light.  Neck: Normal range of motion. No JVD present. No thyromegaly present.  Cardiovascular: Normal rate, regular rhythm, normal heart sounds and intact distal pulses.  Exam reveals no gallop and no friction rub.   No murmur heard. Pulmonary/Chest: Effort normal and breath sounds normal. No respiratory distress. He has no wheezes. He has no rales. He exhibits no tenderness.  Abdominal: Soft. Bowel sounds are normal. He exhibits no distension and no mass. There is no tenderness. There is  no rebound and no guarding.  Musculoskeletal: Normal range of motion. He exhibits no edema or tenderness.  Lymphadenopathy:    He has no cervical adenopathy.  Neurological: He is alert and oriented to person, place, and time. He has normal reflexes. No cranial nerve deficit. He exhibits normal muscle tone. He displays a negative Romberg sign. Coordination and gait normal.  Skin: Skin is warm and dry. No rash noted.  Psychiatric: He has a normal mood and affect. His behavior is normal. Judgment and thought content normal.    Lab Results    Component Value Date   WBC 6.3 06/22/2016   HGB 12.9 (L) 06/22/2016   HCT 38.7 (L) 06/22/2016   PLT 202.0 06/22/2016   GLUCOSE 101 (H) 06/22/2016   CHOL 192 06/22/2016   TRIG 365.0 (H) 06/22/2016   HDL 50.60 06/22/2016   LDLDIRECT 54.0 06/22/2016   LDLCALC 59 03/01/2012   ALT 37 06/22/2016   AST 34 06/22/2016   NA 137 06/22/2016   K 4.1 06/22/2016   CL 102 06/22/2016   CREATININE 1.14 06/22/2016   BUN 16 06/22/2016   CO2 27 06/22/2016   TSH 4.08 06/22/2016   PSA 1.49 06/22/2016    No results found.  Assessment & Plan:   There are no diagnoses linked to this encounter. I am having Mr. Siefker maintain his aspirin, loratadine, cholecalciferol, MEGARED OMEGA-3 KRILL OIL, sildenafil, losartan, nitroGLYCERIN, ezetimibe, and diazepam.  No orders of the defined types were placed in this encounter.    Follow-up: No Follow-up on file.  Walker Kehr, MD

## 2016-10-27 NOTE — Assessment & Plan Note (Signed)
On ASA 

## 2016-10-27 NOTE — Assessment & Plan Note (Signed)
Not using MDIs

## 2016-10-27 NOTE — Assessment & Plan Note (Signed)
Cont w/ASA Statin intolerant Risks associated with treatment noncompliance were discussed. Compliance was encouraged.

## 2016-10-27 NOTE — Patient Instructions (Signed)
MC well w/Jill 

## 2016-10-27 NOTE — Progress Notes (Signed)
Pre visit review using our clinic review tool, if applicable. No additional management support is needed unless otherwise documented below in the visit note. 

## 2017-01-24 ENCOUNTER — Telehealth: Payer: Self-pay | Admitting: *Deleted

## 2017-01-24 MED ORDER — DIAZEPAM 10 MG PO TABS: ORAL_TABLET | ORAL | 0 refills | Status: DC

## 2017-01-24 NOTE — Telephone Encounter (Signed)
Pt called understanding voicemail and confirming appt

## 2017-01-24 NOTE — Telephone Encounter (Signed)
OK to fill this/these prescription(s) with additional refills x0 Thank you!  

## 2017-01-24 NOTE — Telephone Encounter (Signed)
Called pt no answer LMOM rx called into [facility]...[name]

## 2017-01-24 NOTE — Telephone Encounter (Signed)
Rec'd call pt states he will be put of his diazepam on Wednesday, and his appt is not until 8/3. Pt is requesting a refill to be called into [facility]...Johny Chess

## 2017-02-04 ENCOUNTER — Ambulatory Visit (INDEPENDENT_AMBULATORY_CARE_PROVIDER_SITE_OTHER): Payer: Medicare HMO | Admitting: Internal Medicine

## 2017-02-04 ENCOUNTER — Encounter: Payer: Self-pay | Admitting: Internal Medicine

## 2017-02-04 VITALS — BP 118/66 | HR 75 | Temp 97.7°F | Ht 70.0 in | Wt 200.0 lb

## 2017-02-04 DIAGNOSIS — E559 Vitamin D deficiency, unspecified: Secondary | ICD-10-CM

## 2017-02-04 DIAGNOSIS — R69 Illness, unspecified: Secondary | ICD-10-CM | POA: Diagnosis not present

## 2017-02-04 DIAGNOSIS — F411 Generalized anxiety disorder: Secondary | ICD-10-CM

## 2017-02-04 DIAGNOSIS — E782 Mixed hyperlipidemia: Secondary | ICD-10-CM | POA: Diagnosis not present

## 2017-02-04 DIAGNOSIS — Z87891 Personal history of nicotine dependence: Secondary | ICD-10-CM | POA: Diagnosis not present

## 2017-02-04 DIAGNOSIS — Z0000 Encounter for general adult medical examination without abnormal findings: Secondary | ICD-10-CM

## 2017-02-04 DIAGNOSIS — I2510 Atherosclerotic heart disease of native coronary artery without angina pectoris: Secondary | ICD-10-CM

## 2017-02-04 MED ORDER — DIAZEPAM 10 MG PO TABS: ORAL_TABLET | ORAL | 3 refills | Status: DC

## 2017-02-04 NOTE — Assessment & Plan Note (Signed)
Vit D 

## 2017-02-04 NOTE — Assessment & Plan Note (Signed)
Statin intolerant; on Zetia

## 2017-02-04 NOTE — Progress Notes (Signed)
Subjective:  Patient ID: [name], male    DOB: Nov 15, 1950  Age: 66 y.o. MRN: [id number]  CC: No chief complaint on file.   HPI [name] presents for anxiety, HTN, dyslipidemia f/u  Outpatient Medications Prior to Visit  Medication Sig Dispense Refill  . aspirin 81 MG tablet Take 81 mg by mouth daily.      . cholecalciferol (VITAMIN D) 1000 UNITS tablet Take 2,000 Units by mouth daily.     . diazepam (VALIUM) 10 MG tablet TAKE ONE-HALF TO ONE TABLET BY MOUTH EVERY 12 HOURS AS NEEDED FOR ANXIETY 60 tablet 0  . ezetimibe (ZETIA) 10 MG tablet Take 1 tablet (10 mg total) by mouth daily. 90 tablet 3  . losartan (COZAAR) 100 MG tablet Take 1 tablet (100 mg total) by mouth daily. Yearly physical due in March must see MD for refills 90 tablet 3  . MEGARED OMEGA-3 KRILL OIL 500 MG CAPS Take 1 capsule by mouth every morning. 100 capsule 3  . nitroGLYCERIN (NITROSTAT) 0.4 MG SL tablet Place 1 tablet (0.4 mg total) under the tongue every 5 (five) minutes as needed for chest pain. 20 tablet 3  . loratadine (CLARITIN) 10 MG tablet Take 10 mg by mouth daily.      . sildenafil (VIAGRA) 100 MG tablet Take 1 tablet (100 mg total) by mouth daily as needed. 30 tablet 3   No facility-administered medications prior to visit.     ROS Review of Systems  Constitutional: Negative for appetite change, fatigue and unexpected weight change.  HENT: Positive for hearing loss. Negative for congestion, nosebleeds, sneezing, sore throat and trouble swallowing.   Eyes: Negative for itching and visual disturbance.  Respiratory: Negative for cough.   Cardiovascular: Negative for chest pain, palpitations and leg swelling.  Gastrointestinal: Negative for abdominal distention, blood in stool, diarrhea and nausea.  Genitourinary: Negative for frequency and hematuria.  Musculoskeletal: Negative for back pain, gait problem, joint swelling and neck pain.  Skin: Negative for rash.  Neurological: Negative for  dizziness, tremors, speech difficulty and weakness.  Psychiatric/Behavioral: Negative for agitation, dysphoric mood and sleep disturbance. The patient is nervous/anxious.     Objective:  BP 118/66 (BP Location: Left Arm, Patient Position: Sitting, Cuff Size: Large)   Pulse 75   Temp 97.7 F (36.5 C) (Oral)   Ht 5\' 10"  (1.778 m)   Wt 200 lb (90.7 kg)   SpO2 98%   BMI 28.70 kg/m   BP Readings from Last 3 Encounters:  02/04/17 118/66  10/27/16 118/72  06/21/16 138/88    Wt Readings from Last 3 Encounters:  02/04/17 200 lb (90.7 kg)  10/27/16 197 lb 1.9 oz (89.4 kg)  06/21/16 193 lb (87.5 kg)    Physical Exam  Constitutional: He is oriented to person, place, and time. He appears well-developed. No distress.  NAD  HENT:  Mouth/Throat: Oropharynx is clear and moist.  Eyes: Pupils are equal, round, and reactive to light. Conjunctivae are normal.  Neck: Normal range of motion. No JVD present. No thyromegaly present.  Cardiovascular: Normal rate, regular rhythm, normal heart sounds and intact distal pulses.  Exam reveals no gallop and no friction rub.   No murmur heard. Pulmonary/Chest: Effort normal and breath sounds normal. No respiratory distress. He has no wheezes. He has no rales. He exhibits no tenderness.  Abdominal: Soft. Bowel sounds are normal. He exhibits no distension and no mass. There is no tenderness. There is no rebound and  no guarding.  Musculoskeletal: Normal range of motion. He exhibits no edema or tenderness.  Lymphadenopathy:    He has no cervical adenopathy.  Neurological: He is alert and oriented to person, place, and time. He has normal reflexes. No cranial nerve deficit. He exhibits normal muscle tone. He displays a negative Romberg sign. Coordination and gait normal.  Skin: Skin is warm and dry. No rash noted.  Psychiatric: He has a normal mood and affect. His behavior is normal. Judgment and thought content normal.  new hearing aids  Lab Results    Component Value Date   WBC 6.3 06/22/2016   HGB 12.9 (L) 06/22/2016   HCT 38.7 (L) 06/22/2016   PLT 202.0 06/22/2016   GLUCOSE 101 (H) 06/22/2016   CHOL 192 06/22/2016   TRIG 365.0 (H) 06/22/2016   HDL 50.60 06/22/2016   LDLDIRECT 54.0 06/22/2016   LDLCALC 59 03/01/2012   ALT 37 06/22/2016   AST 34 06/22/2016   NA 137 06/22/2016   K 4.1 06/22/2016   CL 102 06/22/2016   CREATININE 1.14 06/22/2016   BUN 16 06/22/2016   CO2 27 06/22/2016   TSH 4.08 06/22/2016   PSA 1.49 06/22/2016    No results found.  Assessment & Plan:   There are no diagnoses linked to this encounter. I have discontinued Mr. Bonfield loratadine and sildenafil. I am also having him maintain his aspirin, cholecalciferol, MEGARED OMEGA-3 KRILL OIL, losartan, nitroGLYCERIN, ezetimibe, and diazepam.  No orders of the defined types were placed in this encounter.    Follow-up: No Follow-up on file.  Walker Kehr, MD

## 2017-02-04 NOTE — Assessment & Plan Note (Signed)
Zetia

## 2017-02-04 NOTE — Assessment & Plan Note (Signed)
Quit 11/2013

## 2017-02-04 NOTE — Assessment & Plan Note (Signed)
Diazepam prn Chronic  Potential benefits of a long term benzodiazepines  use as well as potential risks  and complications were explained to the patient and were aknowledged. Not using Rx w/alcohol 

## 2017-04-05 DIAGNOSIS — R69 Illness, unspecified: Secondary | ICD-10-CM | POA: Diagnosis not present

## 2017-04-22 DIAGNOSIS — R69 Illness, unspecified: Secondary | ICD-10-CM | POA: Diagnosis not present

## 2017-05-10 DIAGNOSIS — Z0101 Encounter for examination of eyes and vision with abnormal findings: Secondary | ICD-10-CM | POA: Diagnosis not present

## 2017-06-08 ENCOUNTER — Encounter: Payer: Self-pay | Admitting: Internal Medicine

## 2017-06-22 ENCOUNTER — Other Ambulatory Visit (INDEPENDENT_AMBULATORY_CARE_PROVIDER_SITE_OTHER): Payer: Medicare HMO

## 2017-06-22 ENCOUNTER — Ambulatory Visit (INDEPENDENT_AMBULATORY_CARE_PROVIDER_SITE_OTHER): Payer: Medicare HMO | Admitting: Internal Medicine

## 2017-06-22 ENCOUNTER — Encounter: Payer: Self-pay | Admitting: Internal Medicine

## 2017-06-22 ENCOUNTER — Ambulatory Visit (INDEPENDENT_AMBULATORY_CARE_PROVIDER_SITE_OTHER)
Admission: RE | Admit: 2017-06-22 | Discharge: 2017-06-22 | Disposition: A | Discharge status: Home / Self Care | Payer: Medicare HMO | Source: Ambulatory Visit | Attending: Internal Medicine | Admitting: Internal Medicine

## 2017-06-22 VITALS — BP 110/68 | HR 67 | Temp 97.7°F | Ht 70.0 in | Wt 193.0 lb

## 2017-06-22 DIAGNOSIS — R059 Cough, unspecified: Secondary | ICD-10-CM

## 2017-06-22 DIAGNOSIS — I2510 Atherosclerotic heart disease of native coronary artery without angina pectoris: Secondary | ICD-10-CM

## 2017-06-22 DIAGNOSIS — F411 Generalized anxiety disorder: Secondary | ICD-10-CM

## 2017-06-22 DIAGNOSIS — I119 Hypertensive heart disease without heart failure: Secondary | ICD-10-CM

## 2017-06-22 DIAGNOSIS — Z0000 Encounter for general adult medical examination without abnormal findings: Secondary | ICD-10-CM | POA: Diagnosis not present

## 2017-06-22 DIAGNOSIS — R05 Cough: Secondary | ICD-10-CM

## 2017-06-22 DIAGNOSIS — R7989 Other specified abnormal findings of blood chemistry: Secondary | ICD-10-CM

## 2017-06-22 DIAGNOSIS — E782 Mixed hyperlipidemia: Secondary | ICD-10-CM

## 2017-06-22 LAB — HEPATIC FUNCTION PANEL
ALK PHOS: 47 U/L (ref 39–117)
ALT: 41 U/L (ref 0–53)
AST: 40 U/L — ABNORMAL HIGH (ref 0–37)
Albumin: 4.2 g/dL (ref 3.5–5.2)
BILIRUBIN DIRECT: 0.1 mg/dL (ref 0.0–0.3)
TOTAL PROTEIN: 8.2 g/dL (ref 6.0–8.3)
Total Bilirubin: 0.5 mg/dL (ref 0.2–1.2)

## 2017-06-22 LAB — BASIC METABOLIC PANEL
BUN: 15 mg/dL (ref 6–23)
CALCIUM: 9 mg/dL (ref 8.4–10.5)
CO2: 23 mEq/L (ref 19–32)
CREATININE: 1.07 mg/dL (ref 0.40–1.50)
Chloride: 105 mEq/L (ref 96–112)
GFR: 73.41 mL/min (ref >60.00)
Glucose, Bld: 98 mg/dL (ref 70–99)
Potassium: 4.3 mEq/L (ref 3.5–5.1)
Sodium: 139 mEq/L (ref 135–145)

## 2017-06-22 LAB — LIPID PANEL
CHOL/HDL RATIO: 4
Cholesterol: 178 mg/dL (ref 0–200)
HDL: 45.6 mg/dL (ref >39.00)
Triglycerides: 416 mg/dL — ABNORMAL HIGH (ref 0.0–149.0)

## 2017-06-22 LAB — TSH: TSH: 7.45 u[IU]/mL — ABNORMAL HIGH (ref 0.35–4.50)

## 2017-06-22 LAB — CBC WITH DIFFERENTIAL/PLATELET
Basophils Absolute: 0.1 10*3/uL (ref 0.0–0.1)
Basophils Relative: 0.8 % (ref 0.0–3.0)
EOS PCT: 1.3 % (ref 0.0–5.0)
Eosinophils Absolute: 0.1 10*3/uL (ref 0.0–0.7)
HCT: 40.9 % (ref 39.0–52.0)
Hemoglobin: 13.5 g/dL (ref 13.0–17.0)
LYMPHS ABS: 2 10*3/uL (ref 0.7–4.0)
Lymphocytes Relative: 30.6 % (ref 12.0–46.0)
MCHC: 33 g/dL (ref 30.0–36.0)
MCV: 81.2 fl (ref 78.0–100.0)
MONOS PCT: 10.8 % (ref 3.0–12.0)
Monocytes Absolute: 0.7 10*3/uL (ref 0.1–1.0)
NEUTROS ABS: 3.8 10*3/uL (ref 1.4–7.7)
NEUTROS PCT: 56.5 % (ref 43.0–77.0)
PLATELETS: 189 10*3/uL (ref 150.0–400.0)
RBC: 5.04 Mil/uL (ref 4.22–5.81)
RDW: 17.2 % — AB (ref 11.5–15.5)
WBC: 6.6 10*3/uL (ref 4.0–10.5)

## 2017-06-22 LAB — URINALYSIS
Bilirubin Urine: NEGATIVE
HGB URINE DIPSTICK: NEGATIVE
Ketones, ur: NEGATIVE
Leukocytes, UA: NEGATIVE
Nitrite: NEGATIVE
Specific Gravity, Urine: 1.01 (ref 1.000–1.030)
TOTAL PROTEIN, URINE-UPE24: NEGATIVE
URINE GLUCOSE: NEGATIVE
Urobilinogen, UA: 0.2 (ref 0.0–1.0)
pH: 6 (ref 5.0–8.0)

## 2017-06-22 LAB — LDL CHOLESTEROL, DIRECT: LDL DIRECT: 57 mg/dL

## 2017-06-22 LAB — PSA: PSA: 2.53 ng/mL (ref 0.10–4.00)

## 2017-06-22 MED ORDER — EZETIMIBE 10 MG PO TABS: 10.0000 mg | ORAL_TABLET | Freq: Every day | ORAL | 3 refills | Status: DC

## 2017-06-22 MED ORDER — DIAZEPAM 10 MG PO TABS: ORAL_TABLET | ORAL | 3 refills | Status: DC

## 2017-06-22 MED ORDER — NITROGLYCERIN 0.4 MG SL SUBL: 0.4000 mg | SUBLINGUAL_TABLET | SUBLINGUAL | 3 refills | Status: DC | PRN

## 2017-06-22 MED ORDER — LOSARTAN POTASSIUM 100 MG PO TABS: 100.0000 mg | ORAL_TABLET | Freq: Every day | ORAL | 3 refills | Status: DC

## 2017-06-22 MED ORDER — ZOSTER VAC RECOMB ADJUVANTED 50 MCG/0.5ML IM SUSR: 0.5000 mL | Freq: Once | INTRAMUSCULAR | 1 refills | Status: AC

## 2017-06-22 NOTE — Progress Notes (Signed)
Subjective:  Patient ID: [name], male    DOB: 1951-02-27  Age: 66 y.o. MRN: [id number]  CC: No chief complaint on file.   HPI [name] presents for a well exam.  F/u anxiety, HTN, dyslipidemia   Outpatient Medications Prior to Visit  Medication Sig Dispense Refill  . aspirin 81 MG tablet Take 81 mg by mouth daily.      . cholecalciferol (VITAMIN D) 1000 UNITS tablet Take 2,000 Units by mouth daily.     . diazepam (VALIUM) 10 MG tablet TAKE ONE-HALF TO ONE TABLET BY MOUTH EVERY 12 HOURS AS NEEDED FOR ANXIETY 60 tablet 3  . ezetimibe (ZETIA) 10 MG tablet Take 1 tablet (10 mg total) by mouth daily. 90 tablet 3  . losartan (COZAAR) 100 MG tablet Take 1 tablet (100 mg total) by mouth daily. Yearly physical due in March must see MD for refills 90 tablet 3  . MEGARED OMEGA-3 KRILL OIL 500 MG CAPS Take 1 capsule by mouth every morning. 100 capsule 3  . nitroGLYCERIN (NITROSTAT) 0.4 MG SL tablet Place 1 tablet (0.4 mg total) under the tongue every 5 (five) minutes as needed for chest pain. 20 tablet 3   No facility-administered medications prior to visit.     ROS Review of Systems  Constitutional: Negative for appetite change, fatigue and unexpected weight change.  HENT: Negative for congestion, nosebleeds, sneezing, sore throat and trouble swallowing.   Eyes: Negative for itching and visual disturbance.  Respiratory: Positive for cough.   Cardiovascular: Negative for chest pain, palpitations and leg swelling.  Gastrointestinal: Negative for abdominal distention, blood in stool, diarrhea and nausea.  Genitourinary: Negative for frequency and hematuria.  Musculoskeletal: Negative for back pain, gait problem, joint swelling and neck pain.  Skin: Negative for rash.  Neurological: Negative for dizziness, tremors, speech difficulty and weakness.  Psychiatric/Behavioral: Negative for agitation, dysphoric mood and sleep disturbance. The patient is nervous/anxious.      Objective:  BP 110/68 (BP Location: Left Arm, Patient Position: Sitting, Cuff Size: Large)   Pulse 67   Temp 97.7 F (36.5 C) (Oral)   Ht 5\' 10"  (1.778 m)   Wt 193 lb (87.5 kg)   SpO2 98%   BMI 27.69 kg/m   BP Readings from Last 3 Encounters:  06/22/17 110/68  02/04/17 118/66  10/27/16 118/72    Wt Readings from Last 3 Encounters:  06/22/17 193 lb (87.5 kg)  02/04/17 200 lb (90.7 kg)  10/27/16 197 lb 1.9 oz (89.4 kg)    Physical Exam  Constitutional: He is oriented to person, place, and time. He appears well-developed. No distress.  NAD  HENT:  Mouth/Throat: Oropharynx is clear and moist.  Eyes: Conjunctivae are normal. Pupils are equal, round, and reactive to light.  Neck: Normal range of motion. No JVD present. No thyromegaly present.  Cardiovascular: Normal rate, regular rhythm, normal heart sounds and intact distal pulses. Exam reveals no gallop and no friction rub.  No murmur heard. Pulmonary/Chest: Effort normal and breath sounds normal. No respiratory distress. He has no wheezes. He has no rales. He exhibits no tenderness.  Abdominal: Soft. Bowel sounds are normal. He exhibits no distension and no mass. There is no tenderness. There is no rebound and no guarding.  Genitourinary: Rectal exam shows guaiac negative stool.  Musculoskeletal: Normal range of motion. He exhibits no edema or tenderness.  Lymphadenopathy:    He has no cervical adenopathy.  Neurological: He is alert and oriented  to person, place, and time. He has normal reflexes. No cranial nerve deficit. He exhibits normal muscle tone. He displays a negative Romberg sign. Coordination and gait normal.  Skin: Skin is warm and dry. No rash noted.  Psychiatric: He has a normal mood and affect. His behavior is normal. Judgment and thought content normal.  prostate 1+  Lab Results  Component Value Date   WBC 6.3 06/22/2016   HGB 12.9 (L) 06/22/2016   HCT 38.7 (L) 06/22/2016   PLT 202.0 06/22/2016    GLUCOSE 101 (H) 06/22/2016   CHOL 192 06/22/2016   TRIG 365.0 (H) 06/22/2016   HDL 50.60 06/22/2016   LDLDIRECT 54.0 06/22/2016   LDLCALC 59 03/01/2012   ALT 37 06/22/2016   AST 34 06/22/2016   NA 137 06/22/2016   K 4.1 06/22/2016   CL 102 06/22/2016   CREATININE 1.14 06/22/2016   BUN 16 06/22/2016   CO2 27 06/22/2016   TSH 4.08 06/22/2016   PSA 1.49 06/22/2016    No results found.  Assessment & Plan:   There are no diagnoses linked to this encounter. I am having Briyan L. Schmieder [name] maintain his aspirin, cholecalciferol, MEGARED OMEGA-3 KRILL OIL, losartan, nitroGLYCERIN, ezetimibe, and diazepam.  No orders of the defined types were placed in this encounter.    Follow-up: No Follow-up on file.  Walker Kehr, MD

## 2017-06-22 NOTE — Assessment & Plan Note (Signed)
Diazepam prn 

## 2017-06-22 NOTE — Assessment & Plan Note (Signed)
ASA NTG prn

## 2017-06-22 NOTE — Assessment & Plan Note (Signed)
Zetia Labs

## 2017-06-22 NOTE — Assessment & Plan Note (Addendum)
Here for medicare wellness/physical  Diet: heart healthy  Physical activity: not sedentary  Depression/mood screen: negative  Hearing: intact to whispered voice  Visual acuity: grossly normal w/glasses, performs annual eye exam  ADLs: capable  Fall risk: low to none  Home safety: good  Cognitive evaluation: intact to orientation, naming, recall and repetition  EOL planning: adv directives, full code/ I agree  I have personally reviewed and have noted  1. The patient's medical, surgical and social history  2. Their use of alcohol, tobacco or illicit drugs  3. Their current medications and supplements  4. The patient's functional ability including ADL's, fall risks, home safety risks and hearing or visual impairment.  5. Diet and physical activities  6. Evidence for depression or mood disorders - anxiety 7. The roster of all physicians providing medical care to patient - is listed in the Snapshot section of the chart and reviewed today.    Today patient counseled on age appropriate routine health concerns for screening and prevention, each reviewed and up to date or declined. Immunizations reviewed and up to date or declined. Labs ordered and reviewed. Risk factors for depression reviewed and negative. Hearing function and visual acuity are intact. ADLs screened and addressed as needed. Functional ability and level of safety reviewed and appropriate. Education, counseling and referrals performed based on assessed risks today. Patient provided with a copy of personalized plan for preventive services.   Colon done in 2013 Dr Carlean Purl

## 2017-06-22 NOTE — Assessment & Plan Note (Signed)
[name]

## 2017-06-22 NOTE — Assessment & Plan Note (Signed)
CXR

## 2017-06-27 ENCOUNTER — Encounter: Payer: Self-pay | Admitting: Internal Medicine

## 2017-06-27 NOTE — Telephone Encounter (Signed)
This encounter was created in error - please disregard.

## 2017-07-26 ENCOUNTER — Telehealth: Payer: Self-pay

## 2017-07-26 DIAGNOSIS — R7989 Other specified abnormal findings of blood chemistry: Secondary | ICD-10-CM

## 2017-07-26 NOTE — Telephone Encounter (Signed)
Pt notified labs entered  Copied from [place] [id number]. Topic: Appointment Scheduling - Scheduling Inquiry for Clinic >> Jul 26, 2017  9:15 AM Arletha Grippe wrote: Reason for CRM: pt called - he says he needs to have thyroid labs per the discussion at his last appt. Please call [phone] when he can go to lab

## 2017-07-27 ENCOUNTER — Other Ambulatory Visit (INDEPENDENT_AMBULATORY_CARE_PROVIDER_SITE_OTHER): Payer: Medicare HMO

## 2017-07-27 DIAGNOSIS — R7989 Other specified abnormal findings of blood chemistry: Secondary | ICD-10-CM

## 2017-07-27 LAB — T4, FREE: Free T4: 0.82 ng/dL (ref 0.60–1.60)

## 2017-07-27 LAB — TSH: TSH: 4.06 u[IU]/mL (ref 0.35–4.50)

## 2017-08-10 DIAGNOSIS — R69 Illness, unspecified: Secondary | ICD-10-CM | POA: Diagnosis not present

## 2017-09-07 ENCOUNTER — Encounter: Payer: Self-pay | Admitting: Internal Medicine

## 2017-10-05 DIAGNOSIS — R69 Illness, unspecified: Secondary | ICD-10-CM | POA: Diagnosis not present

## 2017-10-19 ENCOUNTER — Ambulatory Visit (INDEPENDENT_AMBULATORY_CARE_PROVIDER_SITE_OTHER): Payer: Medicare HMO | Admitting: Internal Medicine

## 2017-10-19 ENCOUNTER — Encounter: Payer: Self-pay | Admitting: Internal Medicine

## 2017-10-19 VITALS — BP 108/66 | HR 64 | Temp 98.0°F | Ht 70.0 in | Wt 198.0 lb

## 2017-10-19 DIAGNOSIS — I2510 Atherosclerotic heart disease of native coronary artery without angina pectoris: Secondary | ICD-10-CM | POA: Diagnosis not present

## 2017-10-19 DIAGNOSIS — E559 Vitamin D deficiency, unspecified: Secondary | ICD-10-CM

## 2017-10-19 DIAGNOSIS — M25562 Pain in left knee: Secondary | ICD-10-CM | POA: Diagnosis not present

## 2017-10-19 DIAGNOSIS — I119 Hypertensive heart disease without heart failure: Secondary | ICD-10-CM | POA: Diagnosis not present

## 2017-10-19 DIAGNOSIS — F411 Generalized anxiety disorder: Secondary | ICD-10-CM | POA: Diagnosis not present

## 2017-10-19 DIAGNOSIS — Z1211 Encounter for screening for malignant neoplasm of colon: Secondary | ICD-10-CM

## 2017-10-19 DIAGNOSIS — R69 Illness, unspecified: Secondary | ICD-10-CM | POA: Diagnosis not present

## 2017-10-19 MED ORDER — DIAZEPAM 10 MG PO TABS: ORAL_TABLET | ORAL | 3 refills | Status: DC

## 2017-10-19 NOTE — Assessment & Plan Note (Signed)
?  meniscal injury Ref to Dr Tamala Julian

## 2017-10-19 NOTE — Assessment & Plan Note (Signed)
Zetia, ASA 

## 2017-10-19 NOTE — Assessment & Plan Note (Signed)
Vit d 

## 2017-10-19 NOTE — Assessment & Plan Note (Signed)
Losartan 

## 2017-10-19 NOTE — Progress Notes (Signed)
Subjective:  Patient ID: [name], male    DOB: [birth year]  Age: 67 y.o. MRN: [id number]  CC: No chief complaint on file.   HPI [name] presents for L knee pain after he stepped in a hole and twisted it a 1.5 months ago. It is painful and feeling stiff behind F/u anxiety, HTN  Outpatient Medications Prior to Visit  Medication Sig Dispense Refill  . aspirin 81 MG tablet Take 81 mg by mouth daily.      . cholecalciferol (VITAMIN D) 1000 UNITS tablet Take 2,000 Units by mouth daily.     . diazepam (VALIUM) 10 MG tablet TAKE ONE-HALF TO ONE TABLET BY MOUTH EVERY 12 HOURS AS NEEDED FOR ANXIETY 60 tablet 3  . ezetimibe (ZETIA) 10 MG tablet Take 1 tablet (10 mg total) by mouth daily. 90 tablet 3  . losartan (COZAAR) 100 MG tablet Take 1 tablet (100 mg total) by mouth daily. Yearly physical due in March must see MD for refills 90 tablet 3  . MEGARED OMEGA-3 KRILL OIL 500 MG CAPS Take 1 capsule by mouth every morning. 100 capsule 3  . nitroGLYCERIN (NITROSTAT) 0.4 MG SL tablet Place 1 tablet (0.4 mg total) under the tongue every 5 (five) minutes as needed for chest pain. 20 tablet 3   No facility-administered medications prior to visit.     ROS Review of Systems  Constitutional: Negative for appetite change, fatigue and unexpected weight change.  HENT: Negative for congestion, nosebleeds, sneezing, sore throat and trouble swallowing.   Eyes: Negative for itching and visual disturbance.  Respiratory: Negative for cough.   Cardiovascular: Negative for chest pain, palpitations and leg swelling.  Gastrointestinal: Negative for abdominal distention, blood in stool, diarrhea and nausea.  Genitourinary: Negative for frequency and hematuria.  Musculoskeletal: Positive for arthralgias and gait problem. Negative for back pain, joint swelling and neck pain.  Skin: Negative for rash.  Neurological: Negative for dizziness, tremors, speech difficulty and weakness.    Psychiatric/Behavioral: Negative for agitation, dysphoric mood and sleep disturbance. The patient is nervous/anxious.     Objective:  BP 108/66 (BP Location: Left Arm, Patient Position: Sitting, Cuff Size: Large)   Pulse 64   Temp 98 F (36.7 C) (Oral)   Ht 5\' 10"  (1.778 m)   Wt 198 lb (89.8 kg)   SpO2 98%   BMI 28.41 kg/m   BP Readings from Last 3 Encounters:  10/19/17 108/66  06/22/17 110/68  02/04/17 118/66    Wt Readings from Last 3 Encounters:  10/19/17 198 lb (89.8 kg)  06/22/17 193 lb (87.5 kg)  02/04/17 200 lb (90.7 kg)    Physical Exam  Constitutional: He is oriented to person, place, and time. He appears well-developed. No distress.  NAD  HENT:  Mouth/Throat: Oropharynx is clear and moist.  Eyes: Pupils are equal, round, and reactive to light. Conjunctivae are normal.  Neck: Normal range of motion. No JVD present. No thyromegaly present.  Cardiovascular: Normal rate, regular rhythm, normal heart sounds and intact distal pulses. Exam reveals no gallop and no friction rub.  No murmur heard. Pulmonary/Chest: Effort normal and breath sounds normal. No respiratory distress. He has no wheezes. He has no rales. He exhibits no tenderness.  Abdominal: Soft. Bowel sounds are normal. He exhibits no distension and no mass. There is no tenderness. There is no rebound and no guarding.  Musculoskeletal: Normal range of motion. He exhibits no edema or tenderness.  Lymphadenopathy:  He has no cervical adenopathy.  Neurological: He is alert and oriented to person, place, and time. He has normal reflexes. No cranial nerve deficit. He exhibits normal muscle tone. He displays a negative Romberg sign. Coordination and gait normal.  Skin: Skin is warm and dry. No rash noted.  Psychiatric: He has a normal mood and affect. His behavior is normal. Judgment and thought content normal.  left knee is sensitive w/ROM, clicking  Lab Results  Component Value Date   WBC 6.6 06/22/2017    HGB 13.5 06/22/2017   HCT 40.9 06/22/2017   PLT 189.0 06/22/2017   GLUCOSE 98 06/22/2017   CHOL 178 06/22/2017   TRIG (H) 06/22/2017    416.0 Triglyceride is over 400; calculations on Lipids are invalid.   HDL 45.60 06/22/2017   LDLDIRECT 57.0 06/22/2017   LDLCALC 59 03/01/2012   ALT 41 06/22/2017   AST 40 (H) 06/22/2017   NA 139 06/22/2017   K 4.3 06/22/2017   CL 105 06/22/2017   CREATININE 1.07 06/22/2017   BUN 15 06/22/2017   CO2 23 06/22/2017   TSH 4.06 07/27/2017   PSA 2.53 06/22/2017    Dg Chest 2 View  Result Date: 06/22/2017 CLINICAL DATA:  Intermittent nonproductive cough and shortness of breath for the past 2 months. History of chronic bronchitis, previous MI, former smoker. EXAM: CHEST  2 VIEW COMPARISON:  PA and lateral chest x-ray of February 06, 2014 FINDINGS: The lungs are well-expanded. There is no focal infiltrate. The interstitial markings are coarse though stable. The heart and pulmonary vascularity are normal. The mediastinum is normal in width. There is faint calcification in the wall of the aortic arch. There is no pleural effusion. The bony thorax exhibits no acute abnormality. IMPRESSION: COPD-smoking related changes. There is no pneumonia nor other acute cardiopulmonary abnormality. Thoracic aortic atherosclerosis. Electronically Signed   By: David  Martinique M.D.   On: 06/22/2017 11:27    Assessment & Plan:   There are no diagnoses linked to this encounter. I am having [name] maintain his aspirin, cholecalciferol, MEGARED OMEGA-3 KRILL OIL, diazepam, ezetimibe, losartan, and nitroGLYCERIN.  No orders of the defined types were placed in this encounter.    Follow-up: No follow-ups on file.  Walker Kehr, MD

## 2017-10-19 NOTE — Assessment & Plan Note (Signed)
Diazepam prn Chronic  Potential benefits of a long term benzodiazepines  use as well as potential risks  and complications were explained to the patient and were aknowledged. Not using Rx w/alcohol

## 2017-11-03 ENCOUNTER — Ambulatory Visit: Payer: Medicare HMO | Admitting: Family Medicine

## 2017-11-03 ENCOUNTER — Ambulatory Visit: Payer: Self-pay

## 2017-11-03 ENCOUNTER — Encounter: Payer: Self-pay | Admitting: Family Medicine

## 2017-11-03 VITALS — BP 130/70 | HR 61 | Ht 70.0 in | Wt 197.0 lb

## 2017-11-03 DIAGNOSIS — G8929 Other chronic pain: Secondary | ICD-10-CM

## 2017-11-03 DIAGNOSIS — M25562 Pain in left knee: Secondary | ICD-10-CM

## 2017-11-03 DIAGNOSIS — S83412A Sprain of medial collateral ligament of left knee, initial encounter: Secondary | ICD-10-CM

## 2017-11-03 NOTE — Assessment & Plan Note (Signed)
Discussed with patient in great length.  Discussed icing regimen and home exercise.  Discussed which activity to doing which wants to avoid.  Discussed compression, topical anti-inflammatories.  Discussed instability of the knee and was given a hinged brace.  Patient will try the conservative therapy and see me again in 4 weeks

## 2017-11-03 NOTE — Patient Instructions (Signed)
Good to see you  Ice 20 minutes 2 times daily. Usually after activity and before bed. pennsaid pinkie amount topically 2 times daily as needed.  Try to avoid twisting motions.  Will take 6-12 weeks to heal.  Wear brace when doing a lot of activity until I see you again in 4 weeks

## 2017-11-03 NOTE — Progress Notes (Signed)
[facility] [place], [place] [address] Phone: [phone] Subjective:     CC: Left knee pain  CBJ:[id number]  [name] is a 67 y.o. male coming in with complaint of left knee pain. Instability. Popping and locking.  Patient states that there may have been an injury.  Continues to have some discomfort.  When he tries to squat down or tries to twist he has severe pain usually on the medial aspect of the knee.  Increasing instability.  Rates the severity pain is 7 out of 10      Past Medical History:  Diagnosis Date  . Allergy to bee sting   . Anxiety   . CAD (coronary artery disease)    statin intolerant  . Cyst   . Depression   . Dermatitis due to sun   . ED (erectile dysfunction)   . Hyperlipidemia   . MI (myocardial infarction) ([facility]) 2002  . OA (osteoarthritis)   . Tobacco abuse   . Vitamin D deficiency 2008   Past Surgical History:  Procedure Laterality Date  . cardiac catherization  07-14-01  . ESOPHAGOGASTRODUODENOSCOPY  10-06-01  . stress cardiolite  06-30-01   Social History   Socioeconomic History  . Marital status: Single    Spouse name: Not on file  . Number of children: Not on file  . Years of education: Not on file  . Highest education level: Not on file  Occupational History  . Occupation: Retired    Comment: 12/2012  Social Needs  . Financial resource strain: Not on file  . Food insecurity:    Worry: Not on file    Inability: Not on file  . Transportation needs:    Medical: Not on file    Non-medical: Not on file  Tobacco Use  . Smoking status: Former Smoker    Packs/day: 2.00    Types: Cigarettes  . Smokeless tobacco: Former Systems developer    Quit date: 12/17/2013  Substance and Sexual Activity  . Alcohol use: Yes    Comment: too much  . Drug use: Yes    Comment: marijuana  . Sexual activity: Yes  Lifestyle  . Physical activity:    Days per week: Not on file    Minutes per session: Not on file  .  Stress: Not on file  Relationships  . Social connections:    Talks on phone: Not on file    Gets together: Not on file    Attends religious service: Not on file    Active member of club or organization: Not on file    Attends meetings of clubs or organizations: Not on file    Relationship status: Not on file  Other Topics Concern  . Not on file  Social History Narrative   Regular exercise-no   Allergies  Allergen Reactions  . Atorvastatin     REACTION: arthralgia  . Crestor [Rosuvastatin Calcium]     pains   Family History  Problem Relation Age of Onset  . Cancer Brother        lymphoma  . Heart disease Father   . Colon cancer Neg Hx   . Esophageal cancer Neg Hx   . Rectal cancer Neg Hx   . Stomach cancer Neg Hx      Past medical history, social, surgical and family history all reviewed in electronic medical record.  No pertanent information unless stated regarding to the chief complaint.   Review of  Systems:Review of systems updated and as accurate as of 11/03/17  No headache, visual changes, nausea, vomiting, diarrhea, constipation, dizziness, abdominal pain, skin rash, fevers, chills, night sweats, weight loss, swollen lymph nodes, body aches,  chest pain, shortness of breath, mood changes.  Positive muscle aches and joint swelling  Objective  Blood pressure 130/70, pulse 61, height 5\' 10"  (1.778 m), weight 197 lb (89.4 kg), SpO2 96 %. Systems examined below as of 11/03/17   General: No apparent distress alert and oriented x3 mood and affect normal, dressed appropriately.  HEENT: Pupils equal, extraocular movements intact  Respiratory: Patient's speak in full sentences and does not appear short of breath  Cardiovascular: No lower extremity edema, non tender, no erythema  Skin: Warm dry intact with no signs of infection or rash on extremities or on axial skeleton.  Abdomen: Soft nontender  Neuro: Cranial nerves II through XII are intact, neurovascularly intact in all  extremities with 2+ DTRs and 2+ pulses.  Lymph: No lymphadenopathy of posterior or anterior cervical chain or axillae bilaterally.  Gait antalgic MSK:  Non tender with full range of motion and good stability and symmetric strength and tone of shoulders, elbows, wrist, hip, and ankles bilaterally.   Left knee exam shows the patient does have crepitus.  Mild lateral tracking of the kneecap.  Patient does have full range of motion with crepitus noted.  Instability noted over the medial aspect of the knee with varus force.  Laxity of the MCL noted.  MSK US performed of: Left knee This study was ordered, performed, and interpreted by Charlann Boxer D.O.  Knee: Left knee.  Plan to have hypoechoic changes and scar tissue formation over the MCL.  There is an avulsion fracture that seems to be healing on the proximal aspect of the tibia.  Medial meniscus does have degenerative tearing along noted with an acute on chronic tear with no significant displacement.  Moderate arthritic changes of the medial compartment.  Mild narrowing of the patellofemoral joint noted.   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    Impression and Recommendations:     This case required medical decision making of moderate complexity.      Note: This dictation was prepared with Dragon dictation along with smaller phrase technology. Any transcriptional errors that result from this process are unintentional.

## 2017-11-08 ENCOUNTER — Ambulatory Visit (AMBULATORY_SURGERY_CENTER): Payer: Self-pay | Admitting: *Deleted

## 2017-11-08 ENCOUNTER — Other Ambulatory Visit: Payer: Self-pay

## 2017-11-08 ENCOUNTER — Encounter: Payer: Self-pay | Admitting: Internal Medicine

## 2017-11-08 VITALS — Ht 70.0 in | Wt 197.0 lb

## 2017-11-08 DIAGNOSIS — Z86010 Personal history of colonic polyps: Secondary | ICD-10-CM

## 2017-11-08 NOTE — Progress Notes (Signed)
No egg or soy allergy known to patient  No issues with past sedation with any surgeries  or procedures, no intubation problems  No diet pills per patient No home 02 use per patient  No blood thinners per patient  Pt denies issues with constipation  No A fib or A flutter  EMMI video sent to pt's e mail pt declined   

## 2017-11-22 ENCOUNTER — Encounter: Payer: Medicare HMO | Admitting: Internal Medicine

## 2017-11-30 ENCOUNTER — Other Ambulatory Visit: Payer: Self-pay

## 2017-11-30 ENCOUNTER — Encounter: Payer: Self-pay | Admitting: Emergency Medicine

## 2017-11-30 ENCOUNTER — Emergency Department: Payer: Medicare HMO

## 2017-11-30 ENCOUNTER — Observation Stay
Admission: EM | Admit: 2017-11-30 | Discharge: 2017-12-01 | Disposition: A | Discharge status: Home / Self Care | Payer: Medicare HMO | Attending: Internal Medicine | Admitting: Internal Medicine

## 2017-11-30 DIAGNOSIS — M1990 Unspecified osteoarthritis, unspecified site: Secondary | ICD-10-CM | POA: Insufficient documentation

## 2017-11-30 DIAGNOSIS — I700 Atherosclerosis of aorta: Secondary | ICD-10-CM | POA: Insufficient documentation

## 2017-11-30 DIAGNOSIS — Z807 Family history of other malignant neoplasms of lymphoid, hematopoietic and related tissues: Secondary | ICD-10-CM | POA: Diagnosis not present

## 2017-11-30 DIAGNOSIS — I252 Old myocardial infarction: Secondary | ICD-10-CM | POA: Diagnosis not present

## 2017-11-30 DIAGNOSIS — R0789 Other chest pain: Secondary | ICD-10-CM | POA: Diagnosis not present

## 2017-11-30 DIAGNOSIS — I2510 Atherosclerotic heart disease of native coronary artery without angina pectoris: Secondary | ICD-10-CM | POA: Insufficient documentation

## 2017-11-30 DIAGNOSIS — E871 Hypo-osmolality and hyponatremia: Secondary | ICD-10-CM | POA: Insufficient documentation

## 2017-11-30 DIAGNOSIS — J439 Emphysema, unspecified: Secondary | ICD-10-CM | POA: Insufficient documentation

## 2017-11-30 DIAGNOSIS — Z8249 Family history of ischemic heart disease and other diseases of the circulatory system: Secondary | ICD-10-CM | POA: Insufficient documentation

## 2017-11-30 DIAGNOSIS — T670XXA Heatstroke and sunstroke, initial encounter: Secondary | ICD-10-CM | POA: Diagnosis not present

## 2017-11-30 DIAGNOSIS — E785 Hyperlipidemia, unspecified: Secondary | ICD-10-CM | POA: Diagnosis not present

## 2017-11-30 DIAGNOSIS — I119 Hypertensive heart disease without heart failure: Secondary | ICD-10-CM | POA: Diagnosis not present

## 2017-11-30 DIAGNOSIS — R69 Illness, unspecified: Secondary | ICD-10-CM | POA: Diagnosis not present

## 2017-11-30 DIAGNOSIS — Z888 Allergy status to other drugs, medicaments and biological substances status: Secondary | ICD-10-CM | POA: Diagnosis not present

## 2017-11-30 DIAGNOSIS — Z7982 Long term (current) use of aspirin: Secondary | ICD-10-CM | POA: Insufficient documentation

## 2017-11-30 DIAGNOSIS — E559 Vitamin D deficiency, unspecified: Secondary | ICD-10-CM | POA: Insufficient documentation

## 2017-11-30 DIAGNOSIS — Z87891 Personal history of nicotine dependence: Secondary | ICD-10-CM | POA: Insufficient documentation

## 2017-11-30 DIAGNOSIS — E782 Mixed hyperlipidemia: Secondary | ICD-10-CM | POA: Diagnosis not present

## 2017-11-30 DIAGNOSIS — Z79899 Other long term (current) drug therapy: Secondary | ICD-10-CM | POA: Diagnosis not present

## 2017-11-30 DIAGNOSIS — F419 Anxiety disorder, unspecified: Secondary | ICD-10-CM | POA: Insufficient documentation

## 2017-11-30 DIAGNOSIS — R079 Chest pain, unspecified: Secondary | ICD-10-CM | POA: Diagnosis present

## 2017-11-30 LAB — CBC
HEMATOCRIT: 40.3 % (ref 40.0–52.0)
HEMOGLOBIN: 13.6 g/dL (ref 13.0–18.0)
MCH: 26.7 pg (ref 26.0–34.0)
MCHC: 33.7 g/dL (ref 32.0–36.0)
MCV: 79.1 fL — ABNORMAL LOW (ref 80.0–100.0)
Platelets: 157 10*3/uL (ref 150–440)
RBC: 5.1 MIL/uL (ref 4.40–5.90)
RDW: 18.7 % — ABNORMAL HIGH (ref 11.5–14.5)
WBC: 5.9 10*3/uL (ref 3.8–10.6)

## 2017-11-30 LAB — TROPONIN I

## 2017-11-30 LAB — BASIC METABOLIC PANEL
ANION GAP: 11 (ref 5–15)
BUN: 17 mg/dL (ref 6–20)
CO2: 23 mmol/L (ref 22–32)
Calcium: 9.3 mg/dL (ref 8.9–10.3)
Chloride: 99 mmol/L — ABNORMAL LOW (ref 101–111)
Creatinine, Ser: 1.21 mg/dL (ref 0.61–1.24)
GFR calc non Af Amer: >60 mL/min (ref >60)
GLUCOSE: 119 mg/dL — AB (ref 65–99)
POTASSIUM: 4 mmol/L (ref 3.5–5.1)
Sodium: 133 mmol/L — ABNORMAL LOW (ref 135–145)

## 2017-11-30 MED ORDER — EZETIMIBE 10 MG PO TABS
10.0000 mg | ORAL_TABLET | Freq: Every day | ORAL | Status: DC
  Administered 2017-12-01: 10 mg via ORAL
  Filled 2017-11-30: qty 1

## 2017-11-30 MED ORDER — DIAZEPAM 5 MG PO TABS
5.0000 mg | ORAL_TABLET | Freq: Three times a day (TID) | ORAL | Status: DC
  Administered 2017-11-30 – 2017-12-01 (×3): 5 mg via ORAL
  Filled 2017-11-30 (×3): qty 1

## 2017-11-30 MED ORDER — ACETAMINOPHEN 650 MG RE SUPP: 650.0000 mg | Freq: Four times a day (QID) | RECTAL | Status: DC | PRN

## 2017-11-30 MED ORDER — NITROGLYCERIN 0.4 MG SL SUBL: 0.4000 mg | SUBLINGUAL_TABLET | SUBLINGUAL | Status: DC | PRN

## 2017-11-30 MED ORDER — ASPIRIN EC 81 MG PO TBEC
81.0000 mg | DELAYED_RELEASE_TABLET | Freq: Every day | ORAL | Status: DC
  Administered 2017-12-01: 81 mg via ORAL
  Filled 2017-11-30: qty 1

## 2017-11-30 MED ORDER — ACETAMINOPHEN 325 MG PO TABS: 650.0000 mg | ORAL_TABLET | Freq: Four times a day (QID) | ORAL | Status: DC | PRN

## 2017-11-30 MED ORDER — IOPAMIDOL (ISOVUE-370) INJECTION 76%
75.0000 mL | Freq: Once | INTRAVENOUS | Status: AC | PRN
  Administered 2017-11-30: 75 mL via INTRAVENOUS

## 2017-11-30 MED ORDER — VITAMIN D3 25 MCG (1000 UNIT) PO TABS
2000.0000 [IU] | ORAL_TABLET | Freq: Every day | ORAL | Status: DC
  Administered 2017-12-01: 2000 [IU] via ORAL
  Filled 2017-11-30: qty 2

## 2017-11-30 MED ORDER — OMEGA-3-ACID ETHYL ESTERS 1 G PO CAPS
1.0000 | ORAL_CAPSULE | Freq: Every morning | ORAL | Status: DC
  Administered 2017-12-01: 1 g via ORAL
  Filled 2017-11-30 (×2): qty 1

## 2017-11-30 MED ORDER — SODIUM CHLORIDE 0.9 % IV SOLN
INTRAVENOUS | Status: DC
  Administered 2017-11-30 – 2017-12-01 (×2): via INTRAVENOUS

## 2017-11-30 MED ORDER — ENOXAPARIN SODIUM 40 MG/0.4ML ~~LOC~~ SOLN
40.0000 mg | SUBCUTANEOUS | Status: DC
  Administered 2017-11-30: 40 mg via SUBCUTANEOUS
  Filled 2017-11-30: qty 0.4

## 2017-11-30 MED ORDER — OXYCODONE HCL 5 MG PO TABS
5.0000 mg | ORAL_TABLET | ORAL | Status: DC | PRN
  Administered 2017-11-30 – 2017-12-01 (×3): 5 mg via ORAL
  Filled 2017-11-30 (×3): qty 1

## 2017-11-30 MED ORDER — KETOROLAC TROMETHAMINE 30 MG/ML IJ SOLN
30.0000 mg | Freq: Once | INTRAMUSCULAR | Status: AC
  Administered 2017-11-30: 30 mg via INTRAVENOUS
  Filled 2017-11-30: qty 1

## 2017-11-30 NOTE — ED Triage Notes (Signed)
Pt arrived via POV with reports of chest pain x 3 days, pt has had cardiac cath in the past but did not require stenting.  Pt states the pain is stabbing in the right chest. Pt states he has been moving things for the past few weeks, pt states he was clammy and having pain 2 nights ago and took 2 nitro which helped with the pain.  Pt rates the pain is 7-8/10 today.  Pt states the pain is constant and was unable to sleep during the night.   Pt reports nausea, pain is non-radiating.

## 2017-11-30 NOTE — ED Notes (Signed)
Pt states Monday R sided CP began. He states "I think I had a stroke." pt states he had a "minor heart attack" a few years ago. States he was bringing groceries in when CP began. States he has been having to move a lot of stuff recently d/t death in family. States "I don't know if I pulled a muscle." admits to diaphoresis and elevated BP when CP began. Took 2 nitro which helped at the time. Pt states "I feel weak and my damn chest still hurts." alert. Oriented, no distress noted.

## 2017-11-30 NOTE — Care Management Obs Status (Signed)
[facility] NOTIFICATION   Patient Details  Name: [name] MRN: [id number] Date of Birth: Jun 13, 1951   Medicare Observation Status Notification Given:  Yes    Katrina Stack, RN 11/30/2017, 4:46 PM

## 2017-11-30 NOTE — ED Notes (Signed)
Admitting at bedside 

## 2017-11-30 NOTE — ED Notes (Signed)
First Nurse Note:  Patient complaining of chest pain taken to Triage 3 upon arrival and EKG performed.

## 2017-11-30 NOTE — H&P (Signed)
[facility] NAME: [name]    MR#:  [id number]  DATE OF BIRTH:  1951/03/25  DATE OF ADMISSION:  11/30/2017  PRIMARY CARE PHYSICIAN: Plotnikov, Evie Lacks, MD   REQUESTING/REFERRING PHYSICIAN: Dr Lavonia Drafts  CHIEF COMPLAINT:   Chief Complaint  Patient presents with  . Chest Pain    HISTORY OF PRESENT ILLNESS:  [name]  is a 67 y.o. male with a known history of CAD, hypertension hyperlipidemia presents with chest pain going on since Monday night.  He describes the pain as stabbing in the chest going on for a few seconds.  Going on and off for the last couple days.  Associated with shortness of breath, nausea vomiting and sweating.  Last night he thinks he had a heatstroke where he was sweating and clammy after coming in from outside.  He has been doing a lot of loading and unloading in the heat recently.  He has had decreased appetite for the last couple days and only drinking water.  Hospitalist services were contacted for further evaluation.  I ordered the second troponin because it was due.  PAST MEDICAL HISTORY:   Past Medical History:  Diagnosis Date  . Allergy   . Allergy to bee sting   . Anxiety   . CAD (coronary artery disease)    statin intolerant  . Cyst   . Depression   . Dermatitis due to sun   . ED (erectile dysfunction)   . Hyperlipidemia   . Hypertension   . MI (myocardial infarction) ([facility]) 2002  . OA (osteoarthritis)   . Tobacco abuse   . Vitamin D deficiency 2008    PAST SURGICAL HISTORY:   Past Surgical History:  Procedure Laterality Date  . broken jaw     1970-80's   . cardiac catherization  07-14-01  . COLONOSCOPY    . ESOPHAGOGASTRODUODENOSCOPY  10-06-01  . POLYPECTOMY    . stress cardiolite  06-30-01  . UPPER GASTROINTESTINAL ENDOSCOPY      SOCIAL HISTORY:   Social History   Tobacco Use  . Smoking status: Former Smoker    Packs/day: 2.00    Types: Cigarettes  .  Smokeless tobacco: Never Used  Substance Use Topics  . Alcohol use: Yes    Comment: too much- occasionally     FAMILY HISTORY:   Family History  Problem Relation Age of Onset  . Cancer Brother        lymphoma  . Heart disease Father   . Colon cancer Neg Hx   . Esophageal cancer Neg Hx   . Rectal cancer Neg Hx   . Stomach cancer Neg Hx   . Colon polyps Neg Hx     DRUG ALLERGIES:   Allergies  Allergen Reactions  . Atorvastatin     REACTION: arthralgia  . Bee Venom   . Crestor [Rosuvastatin Calcium]     pains  . Statins Other (See Comments)    Myalgia    REVIEW OF SYSTEMS:  CONSTITUTIONAL: Positive fever chills and sweats.  Positive for hot feeling. EYES: No blurred or double vision.  Wears glasses. EARS, NOSE, AND THROAT: No tinnitus or ear pain. No sore throat RESPIRATORY: No cough, had shortness of breath, no wheezing or hemoptysis.  CARDIOVASCULAR:  positive for chest pain, no orthopnea, edema.  GASTROINTESTINAL: Some nausea, vomiting.  No diarrhea or abdominal pain. No blood in bowel movements GENITOURINARY: No dysuria, hematuria.  ENDOCRINE: No polyuria, nocturia,  HEMATOLOGY: No anemia, easy bruising or bleeding SKIN: No rash or lesion. MUSCULOSKELETAL: No joint pain or arthritis.   NEUROLOGIC: No tingling, numbness, weakness.  PSYCHIATRY: No anxiety or depression.   MEDICATIONS AT HOME:   Prior to Admission medications   Medication Sig Start Date End Date Taking? Authorizing Provider  aspirin 81 MG tablet Take 81 mg by mouth daily.     Yes [provider]  cholecalciferol (VITAMIN D) 1000 UNITS tablet Take 2,000 Units by mouth daily.    Yes [provider]  diazepam (VALIUM) 10 MG tablet TAKE ONE-HALF TO ONE TABLET BY MOUTH EVERY 12 HOURS AS NEEDED FOR ANXIETY 10/19/17  Yes Plotnikov, Evie Lacks, MD  ezetimibe (ZETIA) 10 MG tablet Take 1 tablet (10 mg total) by mouth daily. Patient not taking: Reported on 11/30/2017 06/22/17   Plotnikov,  Evie Lacks, MD  losartan (COZAAR) 100 MG tablet Take 1 tablet (100 mg total) by mouth daily. Yearly physical due in March must see MD for refills Patient not taking: Reported on 11/30/2017 06/22/17   Plotnikov, Evie Lacks, MD  MEGARED OMEGA-3 KRILL OIL 500 MG CAPS Take 1 capsule by mouth every morning. Patient not taking: Reported on 11/30/2017 03/18/15   Plotnikov, Evie Lacks, MD  nitroGLYCERIN (NITROSTAT) 0.4 MG SL tablet Place 1 tablet (0.4 mg total) under the tongue every 5 (five) minutes as needed for chest pain. 06/22/17   Plotnikov, Evie Lacks, MD      VITAL SIGNS:  Blood pressure 114/71, pulse 79, temperature 99.4 F (37.4 C), temperature source Oral, resp. rate (!) 21, height 5\' 10"  (1.778 m), weight 89.8 kg (198 lb), SpO2 94 %.  PHYSICAL EXAMINATION:  GENERAL:  67 y.o.-year-old patient lying in the bed with no acute distress.  EYES: Pupils equal, round, reactive to light and accommodation. No scleral icterus. Extraocular muscles intact.  HEENT: Head atraumatic, normocephalic. Oropharynx and nasopharynx clear.  NECK:  Supple, no jugular venous distention. No thyroid enlargement, no tenderness.  LUNGS: Normal breath sounds bilaterally, no wheezing, rales,rhonchi or crepitation. No use of accessory muscles of respiration.  CARDIOVASCULAR: S1, S2 normal. No murmurs, rubs, or gallops.  ABDOMEN: Soft, nontender, nondistended. Bowel sounds present. No organomegaly or mass.  EXTREMITIES: No pedal edema, cyanosis, or clubbing.  NEUROLOGIC: Cranial nerves II through XII are intact. Muscle strength 5/5 in all extremities. Sensation intact. Gait not checked.  PSYCHIATRIC: The patient is alert and oriented x 3.  SKIN: No rash, lesion, or ulcer.   LABORATORY PANEL:   CBC Recent Labs  Lab 11/30/17 1014  WBC 5.9  HGB 13.6  HCT 40.3  PLT 157   ------------------------------------------------------------------------------------------------------------------  Chemistries  Recent Labs  Lab  11/30/17 1014  NA 133*  K 4.0  CL 99*  CO2 23  GLUCOSE 119*  BUN 17  CREATININE 1.21  CALCIUM 9.3   ------------------------------------------------------------------------------------------------------------------  Cardiac Enzymes Recent Labs  Lab 11/30/17 1014  TROPONINI <0.03   ------------------------------------------------------------------------------------------------------------------  RADIOLOGY:  Dg Chest 2 View  Result Date: 11/30/2017 CLINICAL DATA:  Right chest pain for 3 days. EXAM: CHEST - 2 VIEW COMPARISON:  06/22/2017 FINDINGS: The cardiomediastinal silhouette is within normal limits. The lungs are well inflated with similar appearance of mild chronic interstitial coarsening. No confluent airspace opacity, edema, pleural effusion, or pneumothorax is identified. No acute osseous abnormality is identified. IMPRESSION: No active cardiopulmonary disease. Electronically Signed   By: Logan Bores M.D.   On: 11/30/2017 10:48   Ct Angio Chest Pe W And/or Wo Contrast  Result Date: 11/30/2017 CLINICAL DATA:  Chest pain EXAM: CT ANGIOGRAPHY CHEST WITH CONTRAST TECHNIQUE: Multidetector CT imaging of the chest was performed using the standard protocol during bolus administration of intravenous contrast. Multiplanar CT image reconstructions and MIPs were obtained to evaluate the vascular anatomy. CONTRAST:  32mL ISOVUE-370 IOPAMIDOL (ISOVUE-370) INJECTION 76% COMPARISON:  Chest CT July 11, 2007; chest radiograph Nov 30, 2017 FINDINGS: Cardiovascular: There is no demonstrable pulmonary embolus. There is no appreciable thoracic aortic aneurysm or dissection. The visualized great vessels appear unremarkable. There is atherosclerotic calcification in the aorta. There are foci of coronary artery calcification. There is no pericardial effusion or pericardial thickening evident. Mediastinum/Nodes: Thyroid appears unremarkable. There are multiple subcentimeter mediastinal lymph nodes. There  is a lymph node anterior to the rightward aspect of the carina measuring 1.3 x 1.2 cm. There is a lymph node to the left of the carina immediately anterior to the esophagus measuring 1.3 x 1.0 cm. There is a subcarinal lymph node measuring 1.3 x 1.1 cm. No esophageal lesions are evident. Lungs/Pleura: There is atelectatic change in both lower lobe regions. There is mild bullous disease in the right upper lobe. There is no edema or consolidation. There is a degree of underlying centrilobular emphysematous change. On axial slice 19 series 6, there is a 4 x 4 mm nodular opacity in the posterior segment of the right upper lobe near the apex. On axial slice 57 series 6, there is a 3 mm nodular opacity in the lateral segment right middle lobe. There is no pleural effusion or pleural thickening evident. Upper Abdomen: There is incomplete visualization of a cyst arising from the medial aspect of the left upper to mid kidney measuring 3.3 x 2.7 cm. Visualized upper abdominal structures otherwise appear normal. Musculoskeletal: There is degenerative change in the upper lumbar spine. No blastic or lytic bone lesions are evident. There are no evident chest wall lesions. Review of the MIP images confirms the above findings. IMPRESSION: 1. No demonstrable pulmonary embolus. No thoracic aortic aneurysm or dissection. There is aortic atherosclerosis as well as foci of coronary artery calcification. 2. No lung edema or consolidation. Areas of atelectatic change present in the bases. Occasional nodular opacities noted, largest measuring 4 mm. No follow-up needed if patient is low-risk (and has no known or suspected primary neoplasm). Non-contrast chest CT can be considered in 12 months if patient is high-risk. This recommendation follows the consensus statement: Guidelines for Management of Incidental Pulmonary Nodules Detected on CT Images: From the Fleischner Society 2017; Radiology 2017; 284:228-243. Underlying emphysematous  change evident. 3.  Several slightly enlarged lymph nodes of uncertain etiology. Aortic Atherosclerosis (ICD10-I70.0) and Emphysema (ICD10-J43.9). Electronically Signed   By: Lowella Grip III M.D.   On: 11/30/2017 13:57    EKG:   Sinus tachycardia 105 bpm, first-degree AV block  IMPRESSION AND PLAN:   1.  Chest pain with history of coronary artery disease.  Atypical nature.  Will send off second and third troponins.  Patient already on aspirin.  Stress test for tomorrow.  CT negative for pulmonary embolism. 2.  Patient thinks he had a heat stroke.  We will give IV fluid hydration hold antihypertensive medications 3.  Hyperlipidemia unspecified on Zetia check a lipid profile tomorrow morning 4.  Hyponatremia give IV fluids 5.  Lung nodules and lymphadenopathy on chest CT scan.  Will need repeat CT scans as outpatient since the patient was a prior smoker  All the records are reviewed and case discussed with  ED provider. Management plans discussed with the patient, family and they are in agreement.  CODE STATUS: Full code  TOTAL TIME TAKING CARE OF THIS PATIENT: 50 minutes.    Loletha Grayer M.D on 11/30/2017 at 3:08 PM  Between 7am to 6pm - Pager - [phone]  After 6pm call admission pager [phone]  [facility]  [phone]  CC: Primary care physician; Plotnikov, Evie Lacks, MD

## 2017-11-30 NOTE — ED Provider Notes (Signed)
[facility] Emergency Department Provider Note   ____________________________________________    I have reviewed the triage vital signs and the nursing notes.   HISTORY  Chief Complaint Chest Pain     HPI [name] is a 67 y.o. male with a history of coronary artery disease including MI in 2012 who presents with complaints of right-sided chest pain.  Patient reports yesterday he was carrying groceries and he developed diaphoresis, weakness and right-sided chest pain.  He felt that he might pass out.  Pain has continued overnight, he has taken nitroglycerin.  Denies fevers or chills.  No significant shortness of breath.  No nausea or vomiting.  No recent travel.  No calf pain or swelling.  Past Medical History:  Diagnosis Date  . Allergy   . Allergy to bee sting   . Anxiety   . CAD (coronary artery disease)    statin intolerant  . Cyst   . Depression   . Dermatitis due to sun   . ED (erectile dysfunction)   . Hyperlipidemia   . Hypertension   . MI (myocardial infarction) ([place]) 2002  . OA (osteoarthritis)   . Tobacco abuse   . Vitamin D deficiency 2008    Patient Active Problem List   Diagnosis Date Noted  . Tear of MCL (medial collateral ligament) of knee, left, initial encounter 11/03/2017  . Knee pain, left 10/19/2017  . Blood donor 10/27/2016  . Hypertensive heart disease 06/17/2015  . 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 Chest pain 08/09/2014  . Personal history of colonic polyps - adenoma 04/17/2012  . Well adult exam 03/08/2012  . Erectile dysfunction 03/08/2012  . Alcohol abuse 03/08/2012  . Infected cyst of skin 10/07/2011  . History of tobacco abuse 10/07/2011  . COPD (chronic obstructive pulmonary disease) ([place]) 11/10/2010  . Sebaceous cyst 11/10/2010  . Vitamin D deficiency 06/01/2010  . ACUTE BRONCHITIS 08/26/2009  . Diarrhea 10/08/2008  . NECK PAIN 09/16/2008  . SINUSITIS- ACUTE-NOS 09/26/2007  . ATONY  OF BLADDER 07/11/2007  . Cough 07/05/2007  . PNEUMONIA, ORGANISM UNSPECIFIED 05/16/2007  . OSTEOARTHRITIS 05/16/2007  . Mixed hyperlipidemia 04/10/2007  . Coronary atherosclerosis 04/10/2007  . Bacterial pneumonia 04/10/2007  . OSTEOPOROSIS 04/10/2007  . Anxiety state 03/20/2007  . Depression 03/20/2007  . RECTAL BLEEDING 03/20/2007  . CHEST PAIN, RIGHT 03/20/2007    Past Surgical History:  Procedure Laterality Date  . broken jaw     1970-80's   . cardiac catherization  07-14-01  . COLONOSCOPY    . ESOPHAGOGASTRODUODENOSCOPY  10-06-01  . POLYPECTOMY    . stress cardiolite  06-30-01  . UPPER GASTROINTESTINAL ENDOSCOPY      Prior to Admission medications   Medication Sig Start Date End Date Taking? Authorizing Provider  aspirin 81 MG tablet Take 81 mg by mouth daily.     Yes [provider]  cholecalciferol (VITAMIN D) 1000 UNITS tablet Take 2,000 Units by mouth daily.    Yes [provider]  diazepam (VALIUM) 10 MG tablet TAKE ONE-HALF TO ONE TABLET BY MOUTH EVERY 12 HOURS AS NEEDED FOR ANXIETY 10/19/17  Yes Plotnikov, Evie Lacks, MD  ezetimibe (ZETIA) 10 MG tablet Take 1 tablet (10 mg total) by mouth daily. Patient not taking: Reported on 11/30/2017 06/22/17   Plotnikov, Evie Lacks, MD  losartan (COZAAR) 100 MG tablet Take 1 tablet (100 mg total) by mouth daily. Yearly physical due in March must see MD for refills Patient not  taking: Reported on 11/30/2017 06/22/17   Plotnikov, Evie Lacks, MD  MEGARED OMEGA-3 KRILL OIL 500 MG CAPS Take 1 capsule by mouth every morning. Patient not taking: Reported on 11/30/2017 03/18/15   Plotnikov, Evie Lacks, MD  nitroGLYCERIN (NITROSTAT) 0.4 MG SL tablet Place 1 tablet (0.4 mg total) under the tongue every 5 (five) minutes as needed for chest pain. 06/22/17   Plotnikov, Evie Lacks, MD     Allergies Atorvastatin; Bee venom; Crestor [rosuvastatin calcium]; and Statins  Family History  Problem Relation Age of Onset  . Cancer Brother         lymphoma  . Heart disease Father   . Colon cancer Neg Hx   . Esophageal cancer Neg Hx   . Rectal cancer Neg Hx   . Stomach cancer Neg Hx   . Colon polyps Neg Hx     Social History Social History   Tobacco Use  . Smoking status: Former Smoker    Packs/day: 2.00    Types: Cigarettes  . Smokeless tobacco: Never Used  Substance Use Topics  . Alcohol use: Yes    Comment: too much- occasionally   . Drug use: Not Currently    Comment: marijuana    Review of Systems  Constitutional: No fever/chills Eyes: No visual changes.  ENT: No sore throat. Cardiovascular: As above Respiratory: Denies shortness of breath. Gastrointestinal: No abdominal pain.   Genitourinary: Negative for dysuria. Musculoskeletal: Negative for back pain. Skin: Negative for rash. Neurological: Negative for headaches    ____________________________________________   PHYSICAL EXAM:  VITAL SIGNS: ED Triage Vitals  Enc Vitals Group     BP 11/30/17 1012 115/80     Pulse Rate 11/30/17 1012 89     Resp 11/30/17 1012 20     Temp 11/30/17 1012 99.4 F (37.4 C)     Temp Source 11/30/17 1012 Oral     SpO2 11/30/17 1012 95 %     Weight 11/30/17 1013 89.8 kg (198 lb)     Height 11/30/17 1013 1.778 m (5\' 10" )     Head Circumference --      Peak Flow --      Pain Score 11/30/17 1020 8     Pain Loc --      Pain Edu? --      Excl. in [place]? --     Constitutional: Alert and oriented. No acute distress. Pleasant and interactive Eyes: Conjunctivae are normal.   Nose: No congestion/rhinnorhea. Mouth/Throat: Mucous membranes are moist.   Neck:  Painless ROM Cardiovascular: Normal rate, regular rhythm. Grossly normal heart sounds.  Good peripheral circulation.  No chest wall tenderness palpation Respiratory: Normal respiratory effort.  No retractions. Lungs CTAB. Gastrointestinal: Soft and nontender. No distention.    Musculoskeletal: No lower extremity tenderness nor edema.  Warm and well  perfused Neurologic:  Normal speech and language. No gross focal neurologic deficits are appreciated.  Skin:  Skin is warm, dry and intact. No rash noted. Psychiatric: Mood and affect are normal. Speech and behavior are normal.  ____________________________________________   LABS (all labs ordered are listed, but only abnormal results are displayed)  Labs Reviewed  BASIC METABOLIC PANEL - Abnormal; Notable for the following components:      Result Value   Sodium 133 (*)    Chloride 99 (*)    Glucose, Bld 119 (*)    All other components within normal limits  CBC - Abnormal; Notable for the following components:   MCV 79.1 (*)  RDW 18.7 (*)    All other components within normal limits  TROPONIN I  TROPONIN I  TROPONIN I  HIV ANTIBODY (ROUTINE TESTING)   ____________________________________________  EKG  ED ECG REPORT I, Lavonia Drafts, the attending physician, personally viewed and interpreted this ECG.  Date: 11/30/2017  Rhythm: Tachycardia QRS Axis: normal Intervals: normal ST/T Wave abnormalities: normal Narrative Interpretation: no evidence of acute ischemia  ____________________________________________  RADIOLOGY  Chest x-ray no acute distress CT angiography no PE ____________________________________________   PROCEDURES  Procedure(s) performed: No  Procedures   Critical Care performed: No ____________________________________________   INITIAL IMPRESSION / ASSESSMENT AND PLAN / ED COURSE  Pertinent labs & imaging results that were available during my care of the patient were reviewed by me and considered in my medical decision making (see chart for details).  Patient presents with right-sided chest pain as described above.  EKG is overall reassuring although mild tachycardia, troponin normal.  Lab work overall unremarkable.  Chest x-ray normal.  Given continued right-sided chest pain and tachycardia CT angiography ordered, negative for  PE  Patient high risk for ACS given his history.  Will administer aspirin and admit to the hospital service    ____________________________________________   FINAL CLINICAL IMPRESSION(S) / ED DIAGNOSES  Final diagnoses:  Chest pain, unspecified type        Note:  This document was prepared using Dragon voice recognition software and may include unintentional dictation errors.    Lavonia Drafts, MD 11/30/17 [phone]

## 2017-12-01 ENCOUNTER — Observation Stay (HOSPITAL_BASED_OUTPATIENT_CLINIC_OR_DEPARTMENT_OTHER): Payer: Medicare HMO

## 2017-12-01 ENCOUNTER — Encounter: Payer: Self-pay | Admitting: Radiology

## 2017-12-01 DIAGNOSIS — R079 Chest pain, unspecified: Secondary | ICD-10-CM

## 2017-12-01 DIAGNOSIS — E871 Hypo-osmolality and hyponatremia: Secondary | ICD-10-CM | POA: Diagnosis not present

## 2017-12-01 DIAGNOSIS — E785 Hyperlipidemia, unspecified: Secondary | ICD-10-CM | POA: Diagnosis not present

## 2017-12-01 DIAGNOSIS — T670XXA Heatstroke and sunstroke, initial encounter: Secondary | ICD-10-CM | POA: Diagnosis not present

## 2017-12-01 LAB — BASIC METABOLIC PANEL
Anion gap: 8 (ref 5–15)
BUN: 15 mg/dL (ref 6–20)
CALCIUM: 8.1 mg/dL — AB (ref 8.9–10.3)
CO2: 22 mmol/L (ref 22–32)
Chloride: 104 mmol/L (ref 101–111)
Creatinine, Ser: 1.05 mg/dL (ref 0.61–1.24)
GFR calc Af Amer: >60 mL/min (ref >60)
GFR calc non Af Amer: >60 mL/min (ref >60)
GLUCOSE: 115 mg/dL — AB (ref 65–99)
Potassium: 4 mmol/L (ref 3.5–5.1)
Sodium: 134 mmol/L — ABNORMAL LOW (ref 135–145)

## 2017-12-01 LAB — CBC
HEMATOCRIT: 37.3 % — AB (ref 40.0–52.0)
Hemoglobin: 12.6 g/dL — ABNORMAL LOW (ref 13.0–18.0)
MCH: 26.8 pg (ref 26.0–34.0)
MCHC: 33.7 g/dL (ref 32.0–36.0)
MCV: 79.5 fL — ABNORMAL LOW (ref 80.0–100.0)
Platelets: 119 10*3/uL — ABNORMAL LOW (ref 150–440)
RBC: 4.7 MIL/uL (ref 4.40–5.90)
RDW: 18.3 % — AB (ref 11.5–14.5)
WBC: 3.9 10*3/uL (ref 3.8–10.6)

## 2017-12-01 LAB — NM MYOCAR MULTI W/SPECT W/WALL MOTION / EF
CHL CUP MPHR: 154 {beats}/min
CHL CUP NUCLEAR SDS: 2
CHL CUP NUCLEAR SRS: 9
CHL CUP NUCLEAR SSS: 6
CHL CUP RESTING HR STRESS: 73 {beats}/min
CSEPED: 0 min
CSEPEW: 1 METS
Exercise duration (sec): 0 s
LV dias vol: 81 mL (ref 62–150)
LV sys vol: 30 mL
Peak HR: 93 {beats}/min
Percent HR: 60 %
TID: 0.94

## 2017-12-01 MED ORDER — TECHNETIUM TC 99M TETROFOSMIN IV KIT
29.7700 | PACK | Freq: Once | INTRAVENOUS | Status: AC | PRN
  Administered 2017-12-01: 29.77 via INTRAVENOUS

## 2017-12-01 MED ORDER — TECHNETIUM TC 99M TETROFOSMIN IV KIT
13.6300 | PACK | Freq: Once | INTRAVENOUS | Status: AC | PRN
  Administered 2017-12-01: 13.63 via INTRAVENOUS

## 2017-12-01 MED ORDER — REGADENOSON 0.4 MG/5ML IV SOLN
0.4000 mg | Freq: Once | INTRAVENOUS | Status: AC
  Administered 2017-12-01: 0.4 mg via INTRAVENOUS

## 2017-12-01 NOTE — Discharge Summary (Signed)
[facility] NAME: [name]    MR#:  [id number]  DATE OF BIRTH:  [birth year]  DATE OF ADMISSION:  11/30/2017 ADMITTING PHYSICIAN: Loletha Grayer, MD  DATE OF DISCHARGE: No discharge date for patient encounter.  PRIMARY CARE PHYSICIAN: Plotnikov, Evie Lacks, MD   ADMISSION DIAGNOSIS:  Chest pain, unspecified type [R07.9]  DISCHARGE DIAGNOSIS:  Atypical chest pain Hypertension Hyperlipidemia  SECONDARY DIAGNOSIS:   Past Medical History:  Diagnosis Date  . Allergy   . Allergy to bee sting   . Anxiety   . CAD (coronary artery disease)    statin intolerant  . Cyst   . Depression   . Dermatitis due to sun   . ED (erectile dysfunction)   . Hyperlipidemia   . Hypertension   . MI (myocardial infarction) ([place]) 2002  . OA (osteoarthritis)   . Tobacco abuse   . Vitamin D deficiency 2008     ADMITTING HISTORY [name]  is a 67 y.o. male with a known history of CAD, hypertension hyperlipidemia presents with chest pain going on since Monday night.  He describes the pain as stabbing in the chest going on for a few seconds.  Going on and off for the last couple days.  Associated with shortness of breath, nausea vomiting and sweating.  Last night he thinks he had a heatstroke where he was sweating and clammy after coming in from outside.  He has been doing a lot of loading and unloading in the heat recently.  He has had decreased appetite for the last couple days and only drinking water.  Hospitalist services were contacted for further evaluation.  I ordered the second troponin because it was due.  HOSPITAL COURSE:  Patient admitted to telemetry.  Serial troponins were negative.  He was worked up with CT angiogram the chest which showed no pulmonary embolism.  Patient had a cardiac stress test which showed no evidence of any ischemia.  No ST segment deviation on these cardiac stress test.  Ejection fraction of 55 to 60%.  CTA chest  showed nodular opacity.  Patient has prior tobacco abuse history.  Patient will need periodic imaging of the chest with CAT scan to assess the nodules at least twice a year as outpatient.  Patient hemodynamically stable will be discharged home.  CONSULTS OBTAINED:    DRUG ALLERGIES:   Allergies  Allergen Reactions  . Atorvastatin     REACTION: arthralgia  . Bee Venom   . Crestor [Rosuvastatin Calcium]     pains  . Statins Other (See Comments)    Myalgia    DISCHARGE MEDICATIONS:   Allergies as of 12/01/2017      Reactions   Atorvastatin    REACTION: arthralgia   Bee Venom    Crestor [rosuvastatin Calcium]    pains   Statins Other (See Comments)   Myalgia      Medication List    STOP taking these medications   MEGARED OMEGA-3 KRILL OIL 500 MG Caps     TAKE these medications   aspirin 81 MG tablet Take 81 mg by mouth daily.   cholecalciferol 1000 units tablet Commonly known as:  VITAMIN D Take 2,000 Units by mouth daily.   diazepam 10 MG tablet Commonly known as:  VALIUM TAKE ONE-HALF TO ONE TABLET BY MOUTH EVERY 12 HOURS AS NEEDED FOR ANXIETY   ezetimibe 10 MG tablet Commonly known as:  ZETIA Take 1 tablet (10 mg total)  by mouth daily.   losartan 100 MG tablet Commonly known as:  COZAAR Take 1 tablet (100 mg total) by mouth daily. Yearly physical due in March must see MD for refills   nitroGLYCERIN 0.4 MG SL tablet Commonly known as:  NITROSTAT Place 1 tablet (0.4 mg total) under the tongue every 5 (five) minutes as needed for chest pain.       Today  Patient seen and evaluated on the day of discharge No complaints of any chest pain, shortness of breath Feels good  VITAL SIGNS:  Blood pressure 106/70, pulse 74, temperature 98.1 F (36.7 C), temperature source Oral, resp. rate 16, height 5\' 10"  (1.778 m), weight 85.9 kg (189 lb 6.4 oz), SpO2 94 %.  I/O:    Intake/Output Summary (Last 24 hours) at 12/01/2017 1457 Last data filed at 12/01/2017  1353 Gross per 24 hour  Intake 1050 ml  Output 625 ml  Net 425 ml    PHYSICAL EXAMINATION:  Physical Exam  GENERAL:  67 y.o.-year-old patient lying in the bed with no acute distress.  LUNGS: Normal breath sounds bilaterally, no wheezing, rales,rhonchi or crepitation. No use of accessory muscles of respiration.  CARDIOVASCULAR: S1, S2 normal. No murmurs, rubs, or gallops.  ABDOMEN: Soft, non-tender, non-distended. Bowel sounds present. No organomegaly or mass.  NEUROLOGIC: Moves all 4 extremities. PSYCHIATRIC: The patient is alert and oriented x 3.  SKIN: No obvious rash, lesion, or ulcer.   DATA REVIEW:   CBC Recent Labs  Lab 12/01/17 0605  WBC 3.9  HGB 12.6*  HCT 37.3*  PLT 119*    Chemistries  Recent Labs  Lab 12/01/17 0605  NA 134*  K 4.0  CL 104  CO2 22  GLUCOSE 115*  BUN 15  CREATININE 1.05  CALCIUM 8.1*    Cardiac Enzymes Recent Labs  Lab 11/30/17 1914  TROPONINI <0.03    Microbiology Results  No results found for this or any previous visit.  RADIOLOGY:  Dg Chest 2 View  Result Date: 11/30/2017 CLINICAL DATA:  Right chest pain for 3 days. EXAM: CHEST - 2 VIEW COMPARISON:  06/22/2017 FINDINGS: The cardiomediastinal silhouette is within normal limits. The lungs are well inflated with similar appearance of mild chronic interstitial coarsening. No confluent airspace opacity, edema, pleural effusion, or pneumothorax is identified. No acute osseous abnormality is identified. IMPRESSION: No active cardiopulmonary disease. Electronically Signed   By: Logan Bores M.D.   On: 11/30/2017 10:48   Ct Angio Chest Pe W And/or Wo Contrast  Result Date: 11/30/2017 CLINICAL DATA:  Chest pain EXAM: CT ANGIOGRAPHY CHEST WITH CONTRAST TECHNIQUE: Multidetector CT imaging of the chest was performed using the standard protocol during bolus administration of intravenous contrast. Multiplanar CT image reconstructions and MIPs were obtained to evaluate the vascular anatomy.  CONTRAST:  60mL ISOVUE-370 IOPAMIDOL (ISOVUE-370) INJECTION 76% COMPARISON:  Chest CT July 11, 2007; chest radiograph Nov 30, 2017 FINDINGS: Cardiovascular: There is no demonstrable pulmonary embolus. There is no appreciable thoracic aortic aneurysm or dissection. The visualized great vessels appear unremarkable. There is atherosclerotic calcification in the aorta. There are foci of coronary artery calcification. There is no pericardial effusion or pericardial thickening evident. Mediastinum/Nodes: Thyroid appears unremarkable. There are multiple subcentimeter mediastinal lymph nodes. There is a lymph node anterior to the rightward aspect of the carina measuring 1.3 x 1.2 cm. There is a lymph node to the left of the carina immediately anterior to the esophagus measuring 1.3 x 1.0 cm. There is a subcarinal lymph  node measuring 1.3 x 1.1 cm. No esophageal lesions are evident. Lungs/Pleura: There is atelectatic change in both lower lobe regions. There is mild bullous disease in the right upper lobe. There is no edema or consolidation. There is a degree of underlying centrilobular emphysematous change. On axial slice 19 series 6, there is a 4 x 4 mm nodular opacity in the posterior segment of the right upper lobe near the apex. On axial slice 57 series 6, there is a 3 mm nodular opacity in the lateral segment right middle lobe. There is no pleural effusion or pleural thickening evident. Upper Abdomen: There is incomplete visualization of a cyst arising from the medial aspect of the left upper to mid kidney measuring 3.3 x 2.7 cm. Visualized upper abdominal structures otherwise appear normal. Musculoskeletal: There is degenerative change in the upper lumbar spine. No blastic or lytic bone lesions are evident. There are no evident chest wall lesions. Review of the MIP images confirms the above findings. IMPRESSION: 1. No demonstrable pulmonary embolus. No thoracic aortic aneurysm or dissection. There is aortic  atherosclerosis as well as foci of coronary artery calcification. 2. No lung edema or consolidation. Areas of atelectatic change present in the bases. Occasional nodular opacities noted, largest measuring 4 mm. No follow-up needed if patient is low-risk (and has no known or suspected primary neoplasm). Non-contrast chest CT can be considered in 12 months if patient is high-risk. This recommendation follows the consensus statement: Guidelines for Management of Incidental Pulmonary Nodules Detected on CT Images: From the Fleischner Society 2017; Radiology 2017; 284:228-243. Underlying emphysematous change evident. 3.  Several slightly enlarged lymph nodes of uncertain etiology. Aortic Atherosclerosis (ICD10-I70.0) and Emphysema (ICD10-J43.9). Electronically Signed   By: Lowella Grip III M.D.   On: 11/30/2017 13:57   Nm Myocar Multi W/spect W/wall Motion / Ef  Result Date: 12/01/2017  There was no ST segment deviation noted during stress.  The study is normal.  This is a low risk study.  The left ventricular ejection fraction is normal (55-65%).     Follow up with PCP in 1 week.  Management plans discussed with the patient, family and they are in agreement.  CODE STATUS: Full code    Code Status Orders  (From admission, onward)        Start     Ordered   11/30/17 1504  Full code  Continuous     11/30/17 1504    Code Status History    This patient has a current code status but no historical code status.      TOTAL TIME TAKING CARE OF THIS PATIENT ON DAY OF DISCHARGE: more than 34 minutes.   Saundra Shelling M.D on 12/01/2017 at 2:57 PM  Between 7am to 6pm - Pager - [phone]  After 6pm go to [email or web address] - password [email or web address] [facility]  Office  [phone]  CC: Primary care physician; Plotnikov, Evie Lacks, MD  Note: This dictation was prepared with Dragon dictation along with smaller phrase technology. Any transcriptional errors that result from this  process are unintentional.

## 2017-12-01 NOTE — Care Management (Signed)
Placed in observation for chest pain.  Independent in all adls, denies issues accessing medical care, obtaining medications or with transportation.  Current with  PCP.  Troponins negative.  For myoview. Anticipate discharge if negative

## 2017-12-01 NOTE — Progress Notes (Signed)
Advanced care plan.  Purpose of the Encounter: CODE STATUS Parties in Attendance:Patient Patient's Decision Capacity:Good Subjective/Patient's story: Presented for chest pain Objective/Medical story And admitted to telemetry for evaluation of chest pain Goals of care determination:  Advance care directives discussed with the patient For now patient wants everything done which includes cardiac resuscitation, intubation and ventilator if the need arises CODE STATUS: Full code Time spent discussing advanced care planning: 16 minutes

## 2017-12-01 NOTE — Plan of Care (Signed)
  Problem: Health Behavior/Discharge Planning: Goal: Ability to manage health-related needs will improve Outcome: Progressing   Problem: Clinical Measurements: Goal: Ability to maintain clinical measurements within normal limits will improve Outcome: Progressing Goal: Will remain free from infection Outcome: Progressing Goal: Diagnostic test results will improve Outcome: Progressing Goal: Respiratory complications will improve Outcome: Progressing Goal: Cardiovascular complication will be avoided Outcome: Progressing   Problem: Activity: Goal: Risk for activity intolerance will decrease Outcome: Progressing   Problem: Coping: Goal: Level of anxiety will decrease Outcome: Progressing   Problem: Elimination: Goal: Will not experience complications related to bowel motility Outcome: Progressing   Problem: Pain Managment: Goal: General experience of comfort will improve Outcome: Progressing   Problem: Safety: Goal: Ability to remain free from injury will improve Outcome: Progressing   Problem: Skin Integrity: Goal: Risk for impaired skin integrity will decrease Outcome: Progressing   No complaints of pain this shift, IV fluids infusing, plan for stress test today, consent signed.

## 2017-12-02 ENCOUNTER — Telehealth: Payer: Self-pay | Admitting: *Deleted

## 2017-12-02 LAB — HIV ANTIBODY (ROUTINE TESTING W REFLEX): HIV Screen 4th Generation wRfx: NONREACTIVE

## 2017-12-02 NOTE — Telephone Encounter (Signed)
Transition Care Management Follow-up Telephone Call   Date discharged? 12/01/17   How have you been since you were released from the hospital? Pt states he is doing alright   Do you understand why you were in the hospital? YES   Do you understand the discharge instructions? YES   Where were you discharged to? Home   Items Reviewed:  Medications reviewed: YES  Allergies reviewed: YES  Dietary changes reviewed: YES  Referrals reviewed: No referral needed   Functional Questionnaire:   Activities of Daily Living (ADLs):   He states he are independent in the following: ambulation, bathing and hygiene, feeding, continence, grooming, toileting and dressing States he doesn't require assistance    Any transportation issues/concerns?: NO   Any patient concerns? NO   Confirmed importance and date/time of follow-up visits scheduled YES, appt 12/08/17  Provider Appointment booked with Dr. Alain Marion  Confirmed with patient if condition begins to worsen call PCP or go to the ER.  Patient was given the office number and encouraged to call back with question or concerns.  : YES

## 2017-12-08 ENCOUNTER — Ambulatory Visit (INDEPENDENT_AMBULATORY_CARE_PROVIDER_SITE_OTHER): Payer: Medicare HMO | Admitting: Internal Medicine

## 2017-12-08 ENCOUNTER — Encounter: Payer: Self-pay | Admitting: Internal Medicine

## 2017-12-08 DIAGNOSIS — E782 Mixed hyperlipidemia: Secondary | ICD-10-CM | POA: Diagnosis not present

## 2017-12-08 DIAGNOSIS — K909 Intestinal malabsorption, unspecified: Secondary | ICD-10-CM | POA: Diagnosis not present

## 2017-12-08 DIAGNOSIS — I252 Old myocardial infarction: Secondary | ICD-10-CM

## 2017-12-08 DIAGNOSIS — R079 Chest pain, unspecified: Secondary | ICD-10-CM

## 2017-12-08 DIAGNOSIS — I119 Hypertensive heart disease without heart failure: Secondary | ICD-10-CM | POA: Diagnosis not present

## 2017-12-08 DIAGNOSIS — R69 Illness, unspecified: Secondary | ICD-10-CM | POA: Diagnosis not present

## 2017-12-08 DIAGNOSIS — M25562 Pain in left knee: Secondary | ICD-10-CM

## 2017-12-08 DIAGNOSIS — R197 Diarrhea, unspecified: Secondary | ICD-10-CM

## 2017-12-08 DIAGNOSIS — F411 Generalized anxiety disorder: Secondary | ICD-10-CM | POA: Diagnosis not present

## 2017-12-08 DIAGNOSIS — J449 Chronic obstructive pulmonary disease, unspecified: Secondary | ICD-10-CM | POA: Diagnosis not present

## 2017-12-08 MED ORDER — UMECLIDINIUM-VILANTEROL 62.5-25 MCG/INH IN AEPB: 1.0000 | INHALATION_SPRAY | Freq: Every day | RESPIRATORY_TRACT | 11 refills | Status: DC

## 2017-12-08 NOTE — Assessment & Plan Note (Signed)
Losartan 

## 2017-12-08 NOTE — Assessment & Plan Note (Signed)
Start Anoro

## 2017-12-08 NOTE — Assessment & Plan Note (Signed)
Diazepam prn 

## 2017-12-08 NOTE — Assessment & Plan Note (Signed)
lactaid

## 2017-12-08 NOTE — Progress Notes (Signed)
Subjective:  Patient ID: [name], male    DOB: 11-03-1950  Age: 67 y.o. MRN: [id number]  CC: No chief complaint on file.   HPI [name] presents for post-hosp f/u for CP. The pt thinks he had a heat stroke, was overworked Tour manager...  Per hx: [name] a66 y.o.malewith a known history of CAD, hypertension hyperlipidemia presents with chest pain going on since Monday night. He describes the pain as stabbing in the chest going on for a few seconds. Going on and off for the last couple days. Associated with shortness of breath, nausea vomiting and sweating. Last night he thinks he had a heatstroke where he was sweating and clammy after coming in from outside. He has been doing a lot of loading and unloading in the heat recently. He has had decreased appetite for the last couple days and only drinking water. Hospitalist services were contacted for further evaluation. I ordered the second troponin because it was due.  HOSPITAL COURSE:  Patient admitted to telemetry.  Serial troponins were negative.  He was worked up with CT angiogram the chest which showed no pulmonary embolism.  Patient had a cardiac stress test which showed no evidence of any ischemia.  No ST segment deviation on these cardiac stress test.  Ejection fraction of 55 to 60%.  CTA chest showed nodular opacity.  Patient has prior tobacco abuse history.  Patient will need periodic imaging of the chest with CAT scan to assess the nodules at least twice a year as outpatient.  Patient hemodynamically stable will be discharged home.   F/u COPD. Needs an MDI No more CP  Outpatient Medications Prior to Visit  Medication Sig Dispense Refill  . aspirin 81 MG tablet Take 81 mg by mouth daily.      . cholecalciferol (VITAMIN D) 1000 UNITS tablet Take 2,000 Units by mouth daily.     . diazepam (VALIUM) 10 MG tablet TAKE ONE-HALF TO ONE TABLET BY MOUTH EVERY 12 HOURS AS NEEDED FOR ANXIETY 60 tablet 3  .  ezetimibe (ZETIA) 10 MG tablet Take 1 tablet (10 mg total) by mouth daily. 90 tablet 3  . losartan (COZAAR) 100 MG tablet Take 1 tablet (100 mg total) by mouth daily. Yearly physical due in March must see MD for refills 90 tablet 3  . nitroGLYCERIN (NITROSTAT) 0.4 MG SL tablet Place 1 tablet (0.4 mg total) under the tongue every 5 (five) minutes as needed for chest pain. 20 tablet 3   No facility-administered medications prior to visit.     ROS: Review of Systems  Constitutional: Negative for appetite change, fatigue and unexpected weight change.  HENT: Negative for congestion, nosebleeds, sneezing, sore throat and trouble swallowing.   Eyes: Negative for itching and visual disturbance.  Respiratory: Negative for cough.   Cardiovascular: Negative for chest pain, palpitations and leg swelling.  Gastrointestinal: Negative for abdominal distention, blood in stool, diarrhea and nausea.  Genitourinary: Negative for frequency and hematuria.  Musculoskeletal: Positive for arthralgias and back pain. Negative for gait problem, joint swelling and neck pain.  Skin: Negative for rash.  Neurological: Negative for dizziness, tremors, speech difficulty and weakness.  Psychiatric/Behavioral: Negative for agitation, dysphoric mood, sleep disturbance and suicidal ideas. The patient is nervous/anxious.     Objective:  BP 124/72 (BP Location: Left Arm, Patient Position: Sitting, Cuff Size: Normal)   Pulse 64   Temp (!) 97.5 F (36.4 C) (Oral)   Ht 5\' 10"  (1.778 m)  Wt 194 lb (88 kg)   SpO2 99%   BMI 27.84 kg/m   BP Readings from Last 3 Encounters:  12/08/17 124/72  12/01/17 106/70  11/03/17 130/70    Wt Readings from Last 3 Encounters:  12/08/17 194 lb (88 kg)  11/30/17 189 lb 6.4 oz (85.9 kg)  11/08/17 197 lb (89.4 kg)    Physical Exam  Constitutional: He is oriented to person, place, and time. He appears well-developed. No distress.  NAD  HENT:  Mouth/Throat: Oropharynx is clear and  moist.  Eyes: Pupils are equal, round, and reactive to light. Conjunctivae are normal.  Neck: Normal range of motion. No JVD present. No thyromegaly present.  Cardiovascular: Normal rate, regular rhythm, normal heart sounds and intact distal pulses. Exam reveals no gallop and no friction rub.  No murmur heard. Pulmonary/Chest: Effort normal and breath sounds normal. No respiratory distress. He has no wheezes. He has no rales. He exhibits no tenderness.  Abdominal: Soft. Bowel sounds are normal. He exhibits no distension and no mass. There is no tenderness. There is no rebound and no guarding.  Musculoskeletal: Normal range of motion. He exhibits tenderness. He exhibits no edema.  Lymphadenopathy:    He has no cervical adenopathy.  Neurological: He is alert and oriented to person, place, and time. He has normal reflexes. No cranial nerve deficit. He exhibits normal muscle tone. He displays a negative Romberg sign. Coordination and gait normal.  Skin: Skin is warm and dry. No rash noted.  Psychiatric: He has a normal mood and affect. His behavior is normal. Judgment and thought content normal.  LS tender Brace on a L knee   I personally provided Anoro inhaler use teaching. After the teaching patient was able to demonstrate it's use effectively. All questions were answered   Lab Results  Component Value Date   WBC 3.9 12/01/2017   HGB 12.6 (L) 12/01/2017   HCT 37.3 (L) 12/01/2017   PLT 119 (L) 12/01/2017   GLUCOSE 115 (H) 12/01/2017   CHOL 178 06/22/2017   TRIG (H) 06/22/2017    416.0 Triglyceride is over 400; calculations on Lipids are invalid.   HDL 45.60 06/22/2017   LDLDIRECT 57.0 06/22/2017   LDLCALC 59 03/01/2012   ALT 41 06/22/2017   AST 40 (H) 06/22/2017   NA 134 (L) 12/01/2017   K 4.0 12/01/2017   CL 104 12/01/2017   CREATININE 1.05 12/01/2017   BUN 15 12/01/2017   CO2 22 12/01/2017   TSH 4.06 07/27/2017   PSA 2.53 06/22/2017    Dg Chest 2 View  Result Date:  11/30/2017 CLINICAL DATA:  Right chest pain for 3 days. EXAM: CHEST - 2 VIEW COMPARISON:  06/22/2017 FINDINGS: The cardiomediastinal silhouette is within normal limits. The lungs are well inflated with similar appearance of mild chronic interstitial coarsening. No confluent airspace opacity, edema, pleural effusion, or pneumothorax is identified. No acute osseous abnormality is identified. IMPRESSION: No active cardiopulmonary disease. Electronically Signed   By: Logan Bores M.D.   On: 11/30/2017 10:48   Ct Angio Chest Pe W And/or Wo Contrast  Result Date: 11/30/2017 CLINICAL DATA:  Chest pain EXAM: CT ANGIOGRAPHY CHEST WITH CONTRAST TECHNIQUE: Multidetector CT imaging of the chest was performed using the standard protocol during bolus administration of intravenous contrast. Multiplanar CT image reconstructions and MIPs were obtained to evaluate the vascular anatomy. CONTRAST:  46mL ISOVUE-370 IOPAMIDOL (ISOVUE-370) INJECTION 76% COMPARISON:  Chest CT July 11, 2007; chest radiograph Nov 30, 2017 FINDINGS: Cardiovascular: There  is no demonstrable pulmonary embolus. There is no appreciable thoracic aortic aneurysm or dissection. The visualized great vessels appear unremarkable. There is atherosclerotic calcification in the aorta. There are foci of coronary artery calcification. There is no pericardial effusion or pericardial thickening evident. Mediastinum/Nodes: Thyroid appears unremarkable. There are multiple subcentimeter mediastinal lymph nodes. There is a lymph node anterior to the rightward aspect of the carina measuring 1.3 x 1.2 cm. There is a lymph node to the left of the carina immediately anterior to the esophagus measuring 1.3 x 1.0 cm. There is a subcarinal lymph node measuring 1.3 x 1.1 cm. No esophageal lesions are evident. Lungs/Pleura: There is atelectatic change in both lower lobe regions. There is mild bullous disease in the right upper lobe. There is no edema or consolidation. There is a  degree of underlying centrilobular emphysematous change. On axial slice 19 series 6, there is a 4 x 4 mm nodular opacity in the posterior segment of the right upper lobe near the apex. On axial slice 57 series 6, there is a 3 mm nodular opacity in the lateral segment right middle lobe. There is no pleural effusion or pleural thickening evident. Upper Abdomen: There is incomplete visualization of a cyst arising from the medial aspect of the left upper to mid kidney measuring 3.3 x 2.7 cm. Visualized upper abdominal structures otherwise appear normal. Musculoskeletal: There is degenerative change in the upper lumbar spine. No blastic or lytic bone lesions are evident. There are no evident chest wall lesions. Review of the MIP images confirms the above findings. IMPRESSION: 1. No demonstrable pulmonary embolus. No thoracic aortic aneurysm or dissection. There is aortic atherosclerosis as well as foci of coronary artery calcification. 2. No lung edema or consolidation. Areas of atelectatic change present in the bases. Occasional nodular opacities noted, largest measuring 4 mm. No follow-up needed if patient is low-risk (and has no known or suspected primary neoplasm). Non-contrast chest CT can be considered in 12 months if patient is high-risk. This recommendation follows the consensus statement: Guidelines for Management of Incidental Pulmonary Nodules Detected on CT Images: From the Fleischner Society 2017; Radiology 2017; 284:228-243. Underlying emphysematous change evident. 3.  Several slightly enlarged lymph nodes of uncertain etiology. Aortic Atherosclerosis (ICD10-I70.0) and Emphysema (ICD10-J43.9). Electronically Signed   By: Lowella Grip [name] M.D.   On: 11/30/2017 13:57   Nm Myocar Multi W/spect W/wall Motion / Ef  Result Date: 12/01/2017  There was no ST segment deviation noted during stress.  The study is normal.  This is a low risk study.  The left ventricular ejection fraction is normal  (55-65%).     Assessment & Plan:   There are no diagnoses linked to this encounter.   No orders of the defined types were placed in this encounter.    Follow-up: No follow-ups on file.  Walker Kehr, MD

## 2017-12-08 NOTE — Assessment & Plan Note (Signed)
Brace

## 2017-12-08 NOTE — Assessment & Plan Note (Signed)
No relapse Statin intolerant

## 2017-12-08 NOTE — Assessment & Plan Note (Signed)
Labs

## 2017-12-08 NOTE — Assessment & Plan Note (Signed)
[facility] Records from hosp stay were reviewed

## 2017-12-08 NOTE — Patient Instructions (Signed)
Lactose Intolerance, Adult Lactose is the natural sugar found in milk and milk products, such as cheese and yogurt. Lactose is digested by lactase, an enzyme in your small intestine. Some people do not produce enough lactase to digest lactose. This is called lactose intolerance. Lactose intolerance is different from milk allergy, which is a more serious reaction to the protein in milk. What are the causes? Causes of lactose intolerance may include:  Normal aging. The ability to produce lactase may decline with age, causing lactose intolerance over time.  Being born without the ability to make lactase.  Digestive diseases such as gastroenteritis or inflammatory bowel disease.  Surgery or injuries to your small intestine.  Infection in your intestines.  Certain antibiotic medicines and cancer treatments.  What are the signs or symptoms? Lactose intolerance can cause uncomfortable symptoms. These are likely to occur within 30 minutes to 2 hours after eating or drinking foods containing lactose. Symptoms of lactose intolerance may include:  Nausea.  Diarrhea.  Abdominal cramps or pain.  Bloating.  Gas.  How is this diagnosed? There are several tests your health care provider can do to diagnose lactose intolerance. These tests include a hydrogen breath test and stool acidity test. How is this treated? No treatment can improve your body's ability to produce lactase. However, your symptoms can be controlled by limiting or avoiding milk products and other sources of lactose and adjusting your diet. Lactose-free milk is often tolerated. Lactose digestion may also be improved by adding lactase drops to regular milk or by taking lactase tablets when dairy products are consumed. Tolerance to lactose is individual. Some people may be able to eat or drink small amounts of products with lactose, while other may need to avoid lactose entirely. Talk to your health care provider about what is best  for you. Follow these instructions at home:  Limit or avoidfoods, beverages, and medicines containing lactose as directed by your health care provider.  Read food and medicine labels carefully to avoid products containing lactose, milk solids, casein, or whey.  If you eliminate dairy products, replace the protein, calcium, vitamin D, and other nutrients they contain through other foods. A registered dietitian or your health care provider can help you adjust your diet.  Choose a milk substitute that is fortified with calcium and vitamin D. Be aware that soy milk contains high quality protein, while milks made from nuts or grains contain very little protein.  Use lactase drops or tablets if directed by your health care provider. Contact a health care provider if: You have no relief from your symptoms after eliminating milk products and other sources of lactose. This information is not intended to replace advice given to you by your health care provider. Make sure you discuss any questions you have with your health care provider. Document Released: 06/21/2005 Document Revised: 11/27/2015 Document Reviewed: 09/21/2013 Elsevier Interactive Patient Education  2018 [employer]  

## 2017-12-19 ENCOUNTER — Ambulatory Visit: Payer: Medicare HMO | Admitting: Family Medicine

## 2018-01-20 ENCOUNTER — Encounter: Payer: Self-pay | Admitting: Internal Medicine

## 2018-01-20 ENCOUNTER — Ambulatory Visit (INDEPENDENT_AMBULATORY_CARE_PROVIDER_SITE_OTHER): Payer: Medicare HMO | Admitting: Internal Medicine

## 2018-01-20 VITALS — BP 122/74 | HR 89 | Temp 97.6°F | Ht 70.0 in | Wt 196.0 lb

## 2018-01-20 DIAGNOSIS — H6121 Impacted cerumen, right ear: Secondary | ICD-10-CM | POA: Diagnosis not present

## 2018-01-20 DIAGNOSIS — E785 Hyperlipidemia, unspecified: Secondary | ICD-10-CM | POA: Diagnosis not present

## 2018-01-20 DIAGNOSIS — I119 Hypertensive heart disease without heart failure: Secondary | ICD-10-CM

## 2018-01-20 DIAGNOSIS — N320 Bladder-neck obstruction: Secondary | ICD-10-CM | POA: Diagnosis not present

## 2018-01-20 DIAGNOSIS — Z0000 Encounter for general adult medical examination without abnormal findings: Secondary | ICD-10-CM | POA: Diagnosis not present

## 2018-01-20 DIAGNOSIS — I2510 Atherosclerotic heart disease of native coronary artery without angina pectoris: Secondary | ICD-10-CM | POA: Diagnosis not present

## 2018-01-20 DIAGNOSIS — F411 Generalized anxiety disorder: Secondary | ICD-10-CM

## 2018-01-20 DIAGNOSIS — Z86010 Personal history of colonic polyps: Secondary | ICD-10-CM

## 2018-01-20 DIAGNOSIS — R69 Illness, unspecified: Secondary | ICD-10-CM | POA: Diagnosis not present

## 2018-01-20 DIAGNOSIS — H6120 Impacted cerumen, unspecified ear: Secondary | ICD-10-CM | POA: Insufficient documentation

## 2018-01-20 MED ORDER — DIAZEPAM 10 MG PO TABS: ORAL_TABLET | ORAL | 3 refills | Status: DC

## 2018-01-20 NOTE — Patient Instructions (Signed)
Well exam in Dec 2019

## 2018-01-20 NOTE — Assessment & Plan Note (Signed)
Losartan 

## 2018-01-20 NOTE — Assessment & Plan Note (Signed)
He will resch colonoscopy today

## 2018-01-20 NOTE — Assessment & Plan Note (Signed)
Diazepam prn   Potential benefits of a long term benzodiazepines  use as well as potential risks  and complications were explained to the patient and were aknowledged. Not using Rx w/alcohol 

## 2018-01-20 NOTE — Progress Notes (Signed)
Subjective:  Patient ID: [name], male    DOB: Aug 05, 1950  Age: 67 y.o. MRN: [id number]  CC: No chief complaint on file.   HPI [name] presents for anxiety, dyslipidemia, HTN C/o ear wax  Outpatient Medications Prior to Visit  Medication Sig Dispense Refill  . aspirin 81 MG tablet Take 81 mg by mouth daily.      . cholecalciferol (VITAMIN D) 1000 UNITS tablet Take 2,000 Units by mouth daily.     [name] ezetimibe (ZETIA) 10 MG tablet Take 1 tablet (10 mg total) by mouth daily. 90 tablet 3  . losartan (COZAAR) 100 MG tablet Take 1 tablet (100 mg total) by mouth daily. Yearly physical due in March must see MD for refills 90 tablet 3  . nitroGLYCERIN (NITROSTAT) 0.4 MG SL tablet Place 1 tablet (0.4 mg total) under the tongue every 5 (five) minutes as needed for chest pain. 20 tablet 3  . umeclidinium-vilanterol (ANORO ELLIPTA) 62.5-25 MCG/INH AEPB Inhale 1 puff into the lungs daily. 1 each 11  . diazepam (VALIUM) 10 MG tablet TAKE ONE-HALF TO ONE TABLET BY MOUTH EVERY 12 HOURS AS NEEDED FOR ANXIETY 60 tablet 3   No facility-administered medications prior to visit.     ROS: Review of Systems  Constitutional: Negative for appetite change, fatigue and unexpected weight change.  HENT: Positive for hearing loss. Negative for congestion, nosebleeds, sneezing, sore throat and trouble swallowing.   Eyes: Negative for itching and visual disturbance.  Respiratory: Negative for cough.   Cardiovascular: Negative for chest pain, palpitations and leg swelling.  Gastrointestinal: Negative for abdominal distention, blood in stool, diarrhea and nausea.  Genitourinary: Negative for frequency and hematuria.  Musculoskeletal: Positive for back pain. Negative for gait problem, joint swelling and neck pain.  Skin: Negative for rash.  Neurological: Negative for dizziness, tremors, speech difficulty and weakness.  Psychiatric/Behavioral: Negative for agitation, dysphoric mood and sleep  disturbance. The patient is nervous/anxious.     Objective:  BP 122/74 (BP Location: Left Arm, Patient Position: Sitting, Cuff Size: Normal)   Pulse 89   Temp 97.6 F (36.4 C) (Oral)   Ht 5\' 10"  (1.778 m)   Wt 196 lb (88.9 kg)   SpO2 96%   BMI 28.12 kg/m   BP Readings from Last 3 Encounters:  01/20/18 122/74  12/08/17 124/72  12/01/17 106/70    Wt Readings from Last 3 Encounters:  01/20/18 196 lb (88.9 kg)  12/08/17 194 lb (88 kg)  11/30/17 189 lb 6.4 oz (85.9 kg)    Physical Exam  Constitutional: He is oriented to person, place, and time. He appears well-developed. No distress.  NAD  HENT:  Mouth/Throat: Oropharynx is clear and moist.  Eyes: Pupils are equal, round, and reactive to light. Conjunctivae are normal.  Neck: Normal range of motion. No JVD present. No thyromegaly present.  Cardiovascular: Normal rate, regular rhythm, normal heart sounds and intact distal pulses. Exam reveals no gallop and no friction rub.  No murmur heard. Pulmonary/Chest: Effort normal and breath sounds normal. No respiratory distress. He has no wheezes. He has no rales. He exhibits no tenderness.  Abdominal: Soft. Bowel sounds are normal. He exhibits no distension and no mass. There is no tenderness. There is no rebound and no guarding.  Musculoskeletal: Normal range of motion. He exhibits no edema or tenderness.  Lymphadenopathy:    He has no cervical adenopathy.  Neurological: He is alert and oriented to person, place, and time. He  has normal reflexes. No cranial nerve deficit. He exhibits normal muscle tone. He displays a negative Romberg sign. Coordination and gait normal.  Skin: Skin is warm and dry. No rash noted.  Psychiatric: He has a normal mood and affect. His behavior is normal. Judgment and thought content normal.  Wax R   Procedure Note :     Procedure :  Ear irrigation   Indication:  Cerumen impaction R   Risks, including pain, dizziness, eardrum perforation, bleeding,  infection and others as well as benefits were explained to the patient in detail. Verbal consent was obtained and the patient agreed to proceed.    We used "The Elephant Ear Irrigation Device" filled with lukewarm water for irrigation. A large amount wax was recovered. Procedure has also required manual wax removal with an ear wax curette and ear forceps.   Tolerated well. Complications: None.   Postprocedure instructions :  Call if problems.    Lab Results  Component Value Date   WBC 3.9 12/01/2017   HGB 12.6 (L) 12/01/2017   HCT 37.3 (L) 12/01/2017   PLT 119 (L) 12/01/2017   GLUCOSE 115 (H) 12/01/2017   CHOL 178 06/22/2017   TRIG (H) 06/22/2017    416.0 Triglyceride is over 400; calculations on Lipids are invalid.   HDL 45.60 06/22/2017   LDLDIRECT 57.0 06/22/2017   LDLCALC 59 03/01/2012   ALT 41 06/22/2017   AST 40 (H) 06/22/2017   NA 134 (L) 12/01/2017   K 4.0 12/01/2017   CL 104 12/01/2017   CREATININE 1.05 12/01/2017   BUN 15 12/01/2017   CO2 22 12/01/2017   TSH 4.06 07/27/2017   PSA 2.53 06/22/2017    Dg Chest 2 View  Result Date: 11/30/2017 CLINICAL DATA:  Right chest pain for 3 days. EXAM: CHEST - 2 VIEW COMPARISON:  06/22/2017 FINDINGS: The cardiomediastinal silhouette is within normal limits. The lungs are well inflated with similar appearance of mild chronic interstitial coarsening. No confluent airspace opacity, edema, pleural effusion, or pneumothorax is identified. No acute osseous abnormality is identified. IMPRESSION: No active cardiopulmonary disease. Electronically Signed   By: Logan Bores M.D.   On: 11/30/2017 10:48   Ct Angio Chest Pe W And/or Wo Contrast  Result Date: 11/30/2017 CLINICAL DATA:  Chest pain EXAM: CT ANGIOGRAPHY CHEST WITH CONTRAST TECHNIQUE: Multidetector CT imaging of the chest was performed using the standard protocol during bolus administration of intravenous contrast. Multiplanar CT image reconstructions and MIPs were obtained to  evaluate the vascular anatomy. CONTRAST:  83mL ISOVUE-370 IOPAMIDOL (ISOVUE-370) INJECTION 76% COMPARISON:  Chest CT July 11, 2007; chest radiograph Nov 30, 2017 FINDINGS: Cardiovascular: There is no demonstrable pulmonary embolus. There is no appreciable thoracic aortic aneurysm or dissection. The visualized great vessels appear unremarkable. There is atherosclerotic calcification in the aorta. There are foci of coronary artery calcification. There is no pericardial effusion or pericardial thickening evident. Mediastinum/Nodes: Thyroid appears unremarkable. There are multiple subcentimeter mediastinal lymph nodes. There is a lymph node anterior to the rightward aspect of the carina measuring 1.3 x 1.2 cm. There is a lymph node to the left of the carina immediately anterior to the esophagus measuring 1.3 x 1.0 cm. There is a subcarinal lymph node measuring 1.3 x 1.1 cm. No esophageal lesions are evident. Lungs/Pleura: There is atelectatic change in both lower lobe regions. There is mild bullous disease in the right upper lobe. There is no edema or consolidation. There is a degree of underlying centrilobular emphysematous change. On axial  slice 19 series 6, there is a 4 x 4 mm nodular opacity in the posterior segment of the right upper lobe near the apex. On axial slice 57 series 6, there is a 3 mm nodular opacity in the lateral segment right middle lobe. There is no pleural effusion or pleural thickening evident. Upper Abdomen: There is incomplete visualization of a cyst arising from the medial aspect of the left upper to mid kidney measuring 3.3 x 2.7 cm. Visualized upper abdominal structures otherwise appear normal. Musculoskeletal: There is degenerative change in the upper lumbar spine. No blastic or lytic bone lesions are evident. There are no evident chest wall lesions. Review of the MIP images confirms the above findings. IMPRESSION: 1. No demonstrable pulmonary embolus. No thoracic aortic aneurysm or  dissection. There is aortic atherosclerosis as well as foci of coronary artery calcification. 2. No lung edema or consolidation. Areas of atelectatic change present in the bases. Occasional nodular opacities noted, largest measuring 4 mm. No follow-up needed if patient is low-risk (and has no known or suspected primary neoplasm). Non-contrast chest CT can be considered in 12 months if patient is high-risk. This recommendation follows the consensus statement: Guidelines for Management of Incidental Pulmonary Nodules Detected on CT Images: From the Fleischner Society 2017; Radiology 2017; 284:228-243. Underlying emphysematous change evident. 3.  Several slightly enlarged lymph nodes of uncertain etiology. Aortic Atherosclerosis (ICD10-I70.0) and Emphysema (ICD10-J43.9). Electronically Signed   By: Lowella Grip [name] M.D.   On: 11/30/2017 13:57   Nm Myocar Multi W/spect W/wall Motion / Ef  Result Date: 12/01/2017  There was no ST segment deviation noted during stress.  The study is normal.  This is a low risk study.  The left ventricular ejection fraction is normal (55-65%).     Assessment & Plan:   There are no diagnoses linked to this encounter.   Meds ordered this encounter  Medications  . diazepam (VALIUM) 10 MG tablet    Sig: TAKE ONE-HALF TO ONE TABLET BY MOUTH EVERY 12 HOURS AS NEEDED FOR ANXIETY    Dispense:  60 tablet    Refill:  3     Follow-up: No follow-ups on file.  Walker Kehr, MD

## 2018-01-20 NOTE — Assessment & Plan Note (Signed)
Zetia

## 2018-01-20 NOTE — Assessment & Plan Note (Signed)
Will irrigate 

## 2018-02-02 ENCOUNTER — Encounter: Payer: Self-pay | Admitting: Internal Medicine

## 2018-02-07 DIAGNOSIS — R69 Illness, unspecified: Secondary | ICD-10-CM | POA: Diagnosis not present

## 2018-02-21 DIAGNOSIS — R69 Illness, unspecified: Secondary | ICD-10-CM | POA: Diagnosis not present

## 2018-03-10 DIAGNOSIS — Z803 Family history of malignant neoplasm of breast: Secondary | ICD-10-CM | POA: Diagnosis not present

## 2018-03-10 DIAGNOSIS — N529 Male erectile dysfunction, unspecified: Secondary | ICD-10-CM | POA: Diagnosis not present

## 2018-03-10 DIAGNOSIS — I10 Essential (primary) hypertension: Secondary | ICD-10-CM | POA: Diagnosis not present

## 2018-03-10 DIAGNOSIS — E785 Hyperlipidemia, unspecified: Secondary | ICD-10-CM | POA: Diagnosis not present

## 2018-03-10 DIAGNOSIS — I252 Old myocardial infarction: Secondary | ICD-10-CM | POA: Diagnosis not present

## 2018-03-10 DIAGNOSIS — G3184 Mild cognitive impairment, so stated: Secondary | ICD-10-CM | POA: Diagnosis not present

## 2018-03-10 DIAGNOSIS — Z809 Family history of malignant neoplasm, unspecified: Secondary | ICD-10-CM | POA: Diagnosis not present

## 2018-03-10 DIAGNOSIS — R69 Illness, unspecified: Secondary | ICD-10-CM | POA: Diagnosis not present

## 2018-03-10 DIAGNOSIS — I25119 Atherosclerotic heart disease of native coronary artery with unspecified angina pectoris: Secondary | ICD-10-CM | POA: Diagnosis not present

## 2018-03-10 DIAGNOSIS — Z7982 Long term (current) use of aspirin: Secondary | ICD-10-CM | POA: Diagnosis not present

## 2018-03-21 ENCOUNTER — Ambulatory Visit (AMBULATORY_SURGERY_CENTER): Payer: Self-pay | Admitting: *Deleted

## 2018-03-21 ENCOUNTER — Encounter: Payer: Self-pay | Admitting: Internal Medicine

## 2018-03-21 VITALS — Ht 69.0 in | Wt 198.0 lb

## 2018-03-21 DIAGNOSIS — Z86010 Personal history of colonic polyps: Secondary | ICD-10-CM

## 2018-03-21 NOTE — Progress Notes (Signed)
Patient denies any allergies to eggs or soy. Patient denies any problems with anesthesia/sedation. Patient denies any oxygen use at home. Patient denies taking any diet/weight loss medications or blood thinners. EMMI education offered, pt declined.  

## 2018-04-04 ENCOUNTER — Ambulatory Visit (AMBULATORY_SURGERY_CENTER): Payer: Medicare HMO | Admitting: Internal Medicine

## 2018-04-04 ENCOUNTER — Emergency Department: Payer: Medicare HMO

## 2018-04-04 ENCOUNTER — Encounter: Payer: Self-pay | Admitting: Internal Medicine

## 2018-04-04 ENCOUNTER — Emergency Department
Admission: EM | Admit: 2018-04-04 | Discharge: 2018-04-04 | Disposition: A | Discharge status: Home / Self Care | Payer: Medicare HMO | Attending: Emergency Medicine | Admitting: Emergency Medicine

## 2018-04-04 ENCOUNTER — Other Ambulatory Visit: Payer: Self-pay

## 2018-04-04 ENCOUNTER — Telehealth: Payer: Self-pay | Admitting: *Deleted

## 2018-04-04 VITALS — BP 107/77 | HR 55 | Temp 98.6°F | Resp 14 | Ht 69.0 in | Wt 198.0 lb

## 2018-04-04 DIAGNOSIS — N281 Cyst of kidney, acquired: Secondary | ICD-10-CM | POA: Diagnosis not present

## 2018-04-04 DIAGNOSIS — I10 Essential (primary) hypertension: Secondary | ICD-10-CM | POA: Insufficient documentation

## 2018-04-04 DIAGNOSIS — K219 Gastro-esophageal reflux disease without esophagitis: Secondary | ICD-10-CM

## 2018-04-04 DIAGNOSIS — R109 Unspecified abdominal pain: Secondary | ICD-10-CM | POA: Diagnosis present

## 2018-04-04 DIAGNOSIS — D122 Benign neoplasm of ascending colon: Secondary | ICD-10-CM | POA: Diagnosis not present

## 2018-04-04 DIAGNOSIS — Z79899 Other long term (current) drug therapy: Secondary | ICD-10-CM | POA: Insufficient documentation

## 2018-04-04 DIAGNOSIS — I2510 Atherosclerotic heart disease of native coronary artery without angina pectoris: Secondary | ICD-10-CM | POA: Insufficient documentation

## 2018-04-04 DIAGNOSIS — J449 Chronic obstructive pulmonary disease, unspecified: Secondary | ICD-10-CM | POA: Diagnosis not present

## 2018-04-04 DIAGNOSIS — Z87891 Personal history of nicotine dependence: Secondary | ICD-10-CM | POA: Diagnosis not present

## 2018-04-04 DIAGNOSIS — R1084 Generalized abdominal pain: Secondary | ICD-10-CM | POA: Diagnosis not present

## 2018-04-04 DIAGNOSIS — Z86010 Personal history of colonic polyps: Secondary | ICD-10-CM

## 2018-04-04 DIAGNOSIS — D123 Benign neoplasm of transverse colon: Secondary | ICD-10-CM | POA: Diagnosis not present

## 2018-04-04 LAB — COMPREHENSIVE METABOLIC PANEL
ALBUMIN: 4.3 g/dL (ref 3.5–5.0)
ALT: 41 U/L (ref 0–44)
AST: 46 U/L — AB (ref 15–41)
Alkaline Phosphatase: 47 U/L (ref 38–126)
Anion gap: 9 (ref 5–15)
BUN: 13 mg/dL (ref 8–23)
CO2: 27 mmol/L (ref 22–32)
CREATININE: 1.12 mg/dL (ref 0.61–1.24)
Calcium: 9.9 mg/dL (ref 8.9–10.3)
Chloride: 103 mmol/L (ref 98–111)
GFR calc Af Amer: >60 mL/min (ref >60)
GFR calc non Af Amer: >60 mL/min (ref >60)
GLUCOSE: 154 mg/dL — AB (ref 70–99)
Potassium: 3.7 mmol/L (ref 3.5–5.1)
Sodium: 139 mmol/L (ref 135–145)
Total Bilirubin: 0.5 mg/dL (ref 0.3–1.2)
Total Protein: 8.7 g/dL — ABNORMAL HIGH (ref 6.5–8.1)

## 2018-04-04 LAB — URINALYSIS, COMPLETE (UACMP) WITH MICROSCOPIC
BACTERIA UA: NONE SEEN
Bilirubin Urine: NEGATIVE
GLUCOSE, UA: NEGATIVE mg/dL
Hgb urine dipstick: NEGATIVE
Ketones, ur: NEGATIVE mg/dL
Leukocytes, UA: NEGATIVE
Nitrite: NEGATIVE
PROTEIN: NEGATIVE mg/dL
SQUAMOUS EPITHELIAL / LPF: NONE SEEN (ref 0–5)
Specific Gravity, Urine: 1.021 (ref 1.005–1.030)
WBC UA: NONE SEEN WBC/hpf (ref 0–5)
pH: 7 (ref 5.0–8.0)

## 2018-04-04 LAB — CBC WITH DIFFERENTIAL/PLATELET
Basophils Absolute: 0 10*3/uL (ref 0–0.1)
Basophils Relative: 0 %
EOS PCT: 0 %
Eosinophils Absolute: 0 10*3/uL (ref 0–0.7)
HCT: 38.2 % — ABNORMAL LOW (ref 40.0–52.0)
Hemoglobin: 13.3 g/dL (ref 13.0–18.0)
LYMPHS ABS: 1.4 10*3/uL (ref 1.0–3.6)
LYMPHS PCT: 14 %
MCH: 27.4 pg (ref 26.0–34.0)
MCHC: 34.9 g/dL (ref 32.0–36.0)
MCV: 78.5 fL — AB (ref 80.0–100.0)
MONO ABS: 0.5 10*3/uL (ref 0.2–1.0)
Monocytes Relative: 5 %
Neutro Abs: 7.7 10*3/uL — ABNORMAL HIGH (ref 1.4–6.5)
Neutrophils Relative %: 81 %
PLATELETS: 182 10*3/uL (ref 150–440)
RBC: 4.86 MIL/uL (ref 4.40–5.90)
RDW: 17.2 % — AB (ref 11.5–14.5)
WBC: 9.6 10*3/uL (ref 3.8–10.6)

## 2018-04-04 LAB — LIPASE, BLOOD: Lipase: 37 U/L (ref 11–51)

## 2018-04-04 LAB — TROPONIN I

## 2018-04-04 MED ORDER — ALUM & MAG HYDROXIDE-SIMETH 400-400-40 MG/5ML PO SUSP: 5.0000 mL | Freq: Four times a day (QID) | ORAL | 0 refills | Status: DC | PRN

## 2018-04-04 MED ORDER — SODIUM CHLORIDE 0.9 % IV BOLUS
1000.0000 mL | Freq: Once | INTRAVENOUS | Status: AC
  Administered 2018-04-04: 1000 mL via INTRAVENOUS

## 2018-04-04 MED ORDER — FENTANYL CITRATE (PF) 100 MCG/2ML IJ SOLN
INTRAMUSCULAR | Status: AC
  Filled 2018-04-04: qty 2

## 2018-04-04 MED ORDER — FAMOTIDINE 20 MG PO TABS: 20.0000 mg | ORAL_TABLET | Freq: Two times a day (BID) | ORAL | 1 refills | Status: DC

## 2018-04-04 MED ORDER — GI COCKTAIL ~~LOC~~
30.0000 mL | Freq: Once | ORAL | Status: AC
  Administered 2018-04-04: 30 mL via ORAL
  Filled 2018-04-04: qty 30

## 2018-04-04 MED ORDER — ONDANSETRON HCL 4 MG/2ML IJ SOLN
4.0000 mg | Freq: Once | INTRAMUSCULAR | Status: AC
  Administered 2018-04-04: 4 mg via INTRAVENOUS
  Filled 2018-04-04: qty 2

## 2018-04-04 MED ORDER — IOHEXOL 350 MG/ML SOLN
100.0000 mL | Freq: Once | INTRAVENOUS | Status: AC | PRN
  Administered 2018-04-04: 100 mL via INTRAVENOUS

## 2018-04-04 MED ORDER — FENTANYL CITRATE (PF) 100 MCG/2ML IJ SOLN
50.0000 ug | Freq: Once | INTRAMUSCULAR | Status: AC
  Administered 2018-04-04: 50 ug via INTRAVENOUS

## 2018-04-04 MED ORDER — OXYCODONE HCL 5 MG PO TABS
5.0000 mg | ORAL_TABLET | Freq: Once | ORAL | Status: AC
  Administered 2018-04-04: 5 mg via ORAL
  Filled 2018-04-04: qty 1

## 2018-04-04 MED ORDER — MORPHINE SULFATE (PF) 4 MG/ML IV SOLN
4.0000 mg | Freq: Once | INTRAVENOUS | Status: AC
  Administered 2018-04-04: 4 mg via INTRAVENOUS
  Filled 2018-04-04: qty 1

## 2018-04-04 MED ORDER — ONDANSETRON 4 MG PO TBDP: 4.0000 mg | ORAL_TABLET | Freq: Three times a day (TID) | ORAL | 0 refills | Status: DC | PRN

## 2018-04-04 MED ORDER — SODIUM CHLORIDE 0.9 % IV SOLN: 500.0000 mL | Freq: Once | INTRAVENOUS | Status: DC

## 2018-04-04 MED ORDER — ACETAMINOPHEN 500 MG PO TABS
1000.0000 mg | ORAL_TABLET | Freq: Once | ORAL | Status: AC
  Administered 2018-04-04: 1000 mg via ORAL
  Filled 2018-04-04: qty 2

## 2018-04-04 NOTE — Telephone Encounter (Signed)
Patient's wife called and I spoke to them both.  The patient stated that he had two bowel movements since dischage.  He stated that the last one made him "blow up like a balloon."  Stated that the abd pain was a 10, and then stated that it was an 8.   No fever.  No SOB. Patient stated that he had not been moving off the couch today.  I stated that he should go to the ED if the pain was that bad. The patient stated that he would get moving to pass air, and that he would call if not better.  He refused to go to the ED at this time.   Dr Carlean Purl notified immediately, and he will call the patient.

## 2018-04-04 NOTE — ED Notes (Signed)
Patient transported to CT 

## 2018-04-04 NOTE — Discharge Instructions (Signed)

## 2018-04-04 NOTE — Telephone Encounter (Signed)
Spoke to him and spouse  Bloated and frontal abd pain  Was napping  Not much flatus  No fever  Advised ambulation, warm liquids - suspect he will pass gas and feel better  If no resolution/improvement in next 60-90 mins would go to ED

## 2018-04-04 NOTE — Progress Notes (Signed)
Spontaneous respirations throughout. VSS. Resting comfortably. To PACU on room air. Report to  RN. 

## 2018-04-04 NOTE — Op Note (Addendum)
[facility] Patient Name: [name] Procedure Date: 04/04/2018 8:19 AM MRN: [id number] Endoscopist: Gatha Mayer , MD Age: 67 Referring MD:  Date of Birth: May 22, 1951 Gender: Male Account #: [id number] Procedure:                Colonoscopy Indications:              Surveillance: Personal history of adenomatous                            polyps on last colonoscopy > 5 years ago, Last                            colonoscopy: 2013 Medicines:                Propofol per Anesthesia, Monitored Anesthesia Care Procedure:                Pre-Anesthesia Assessment:                           - Prior to the procedure, a History and Physical                            was performed, and patient medications and                            allergies were reviewed. The patient's tolerance of                            previous anesthesia was also reviewed. The risks                            and benefits of the procedure and the sedation                            options and risks were discussed with the patient.                            All questions were answered, and informed consent                            was obtained. Prior Anticoagulants: The patient has                            taken no previous anticoagulant or antiplatelet                            agents. ASA Grade Assessment: III - A patient with                            severe systemic disease. After reviewing the risks                            and benefits, the patient was deemed in  satisfactory condition to undergo the procedure.                           After obtaining informed consent, the colonoscope                            was passed under direct vision. Throughout the                            procedure, the patient's blood pressure, pulse, and                            oxygen saturations were monitored continuously. The                            Colonoscope was introduced  through the anus and                            advanced to the the cecum, identified by                            appendiceal orifice and ileocecal valve. The                            colonoscopy was somewhat difficult due to                            significant looping. Successful completion of the                            procedure was aided by applying abdominal pressure.                            The patient tolerated the procedure well. The                            quality of the bowel preparation was excellent. The                            ileocecal valve, appendiceal orifice, and rectum                            were photographed. The bowel preparation used was                            Miralax. Scope In: 8:34:14 AM Scope Out: 8:53:50 AM Scope Withdrawal Time: 0 hours 17 minutes 6 seconds  Total Procedure Duration: 0 hours 19 minutes 36 seconds  Findings:                 The perianal and digital rectal examinations were                            normal. Pertinent negatives include normal prostate                            (  size, shape, and consistency).                           A diminutive polyp was found in the ascending                            colon. The polyp was sessile. The polyp was removed                            with a cold snare. Resection and retrieval were                            complete. Verification of patient identification                            for the specimen was done. Estimated blood loss was                            minimal.                           A diminutive polyp was found in the transverse                            colon. The polyp was sessile. The polyp was removed                            with a cold biopsy forceps. Resection and retrieval                            were complete. Verification of patient                            identification for the specimen was done. Estimated                            blood  loss was minimal.                           There was a medium-sized lipoma, in the cecum.                           The exam was otherwise without abnormality on                            direct and retroflexion views. Complications:            No immediate complications. Estimated Blood Loss:     Estimated blood loss was minimal. Impression:               - One diminutive polyp in the ascending colon,                            removed with a cold snare. Resected and retrieved.                           -  One diminutive polyp in the transverse colon,                            removed with a cold biopsy forceps. Resected and                            retrieved.                           - Medium-sized lipoma in the cecum.                           - The examination was otherwise normal on direct                            and retroflexion views.                           - Personal history of colonic polyp diminutive                            adenoma 2013. Recommendation:           - Patient has a contact number available for                            emergencies. The signs and symptoms of potential                            delayed complications were discussed with the                            patient. Return to normal activities tomorrow.                            Written discharge instructions were provided to the                            patient.                           - Resume previous diet.                           - Continue present medications.                           - Repeat colonoscopy is recommended for                            surveillance. The colonoscopy date will be                            determined after pathology results from today's                            exam become available for review. Glendell Docker  Simonne Maffucci, MD 04/04/2018 8:59:48 AM This report has been signed electronically.

## 2018-04-04 NOTE — Progress Notes (Signed)
Called to room to assist during endoscopic procedure.  Patient ID and intended procedure confirmed with present staff. Received instructions for my participation in the procedure from the performing physician.  

## 2018-04-04 NOTE — Progress Notes (Signed)
Pt's states no medical or surgical changes since previsit or office visit. 

## 2018-04-04 NOTE — Patient Instructions (Addendum)
I found and removed 2 tiny polyps.  I will let you know pathology results and when to have another routine colonoscopy by mail and/or My Chart.  I appreciate the opportunity to care for you. Gatha Mayer, MD, FACG   YOU HAD AN ENDOSCOPIC PROCEDURE TODAY AT [facility]:   Refer to the procedure report that was given to you for any specific questions about what was found during the examination.  If the procedure report does not answer your questions, please call your gastroenterologist to clarify.  If you requested that your care partner not be given the details of your procedure findings, then the procedure report has been included in a sealed envelope for you to review at your convenience later.  YOU SHOULD EXPECT: Some feelings of bloating in the abdomen. Passage of more gas than usual.  Walking can help get rid of the air that was put into your GI tract during the procedure and reduce the bloating. If you had a lower endoscopy (such as a colonoscopy or flexible sigmoidoscopy) you may notice spotting of blood in your stool or on the toilet paper. If you underwent a bowel prep for your procedure, you may not have a normal bowel movement for a few days.  Please Note:  You might notice some irritation and congestion in your nose or some drainage.  This is from the oxygen used during your procedure.  There is no need for concern and it should clear up in a day or so.  SYMPTOMS TO REPORT IMMEDIATELY:   Following lower endoscopy (colonoscopy or flexible sigmoidoscopy):  Excessive amounts of blood in the stool  Significant tenderness or worsening of abdominal pains  Swelling of the abdomen that is new, acute  Fever of 100F or higher   For urgent or emergent issues, a gastroenterologist can be reached at any hour by calling [phone].   DIET:  We do recommend a small meal at first, but then you may proceed to your regular diet.  Drink plenty of fluids but you  should avoid alcoholic beverages for 24 hours.  ACTIVITY:  You should plan to take it easy for the rest of today and you should NOT DRIVE or use heavy machinery until tomorrow (because of the sedation medicines used during the test).    FOLLOW UP: Our staff will call the number listed on your records the next business day following your procedure to check on you and address any questions or concerns that you may have regarding the information given to you following your procedure. If we do not reach you, we will leave a message.  However, if you are feeling well and you are not experiencing any problems, there is no need to return our call.  We will assume that you have returned to your regular daily activities without incident.  If any biopsies were taken you will be contacted by phone or by letter within the next 1-3 weeks.  Please call us at [phone] if you have not heard about the biopsies in 3 weeks.    SIGNATURES/CONFIDENTIALITY: You and/or your care partner have signed paperwork which will be entered into your electronic medical record.  These signatures attest to the fact that that the information above on your After Visit Summary has been reviewed and is understood.  Full responsibility of the confidentiality of this discharge information lies with you and/or your care-partner.  Read all handouts given to you by your recovery room nurse.

## 2018-04-04 NOTE — ED Triage Notes (Signed)
Pt had  Colonoscopy this am and states since 1600 has been vomiting with abd pain and distention.

## 2018-04-04 NOTE — ED Provider Notes (Addendum)
[facility] Emergency Department Provider Note  ____________________________________________  Time seen: Approximately 8:56 PM  I have reviewed the triage vital signs and the nursing notes.   HISTORY  Chief Complaint Abdominal Pain   HPI [name] is a 67 y.o. male with history of CAD, hypertension, hyperlipidemia status post colonoscopy this morning who presents for abdominal pain.  Patient reports that his colonoscopy went well, he came home and slept.  He woke up at 4 PM with diffuse sharp severe abdominal pain radiating to his back, he feels like his abdomen is distended.  He has been belching but has not been able to pass gas from below.  He reports 3 episodes of nonbloody nonbilious emesis and severe nausea.  He did have a small bowel movement after the colonoscopy.  No fever or chills, no chest pain or shortness of breath.  No prior abdominal surgeries.  Patient had 2 polyps removed today.  Past Medical History:  Diagnosis Date  . Allergy   . Allergy to bee sting   . Anxiety   . CAD (coronary artery disease)    statin intolerant  . Cyst   . Depression   . Dermatitis due to sun   . ED (erectile dysfunction)   . Hyperlipidemia   . Hypertension   . MI (myocardial infarction) ([place]) 2002  . OA (osteoarthritis)   . Tobacco abuse   . Vitamin D deficiency 2008    Patient Active Problem List   Diagnosis Date Noted  . Cerumen impaction 01/20/2018  . Tear of MCL (medial collateral ligament) of knee, left, initial encounter 11/03/2017  . Knee pain, left 10/19/2017  . Blood donor 10/27/2016  . Hypertensive heart disease 06/17/2015  . 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 Chest pain 08/09/2014  . Personal history of colonic polyps - adenoma 04/17/2012  . Well adult exam 03/08/2012  . Erectile dysfunction 03/08/2012  . Alcohol abuse 03/08/2012  . Infected cyst of skin 10/07/2011  . History of tobacco abuse 10/07/2011  . COPD (chronic  obstructive pulmonary disease) ([place]) 11/10/2010  . Sebaceous cyst 11/10/2010  . Vitamin D deficiency 06/01/2010  . ACUTE BRONCHITIS 08/26/2009  . Diarrhea 10/08/2008  . NECK PAIN 09/16/2008  . SINUSITIS- ACUTE-NOS 09/26/2007  . ATONY OF BLADDER 07/11/2007  . Cough 07/05/2007  . PNEUMONIA, ORGANISM UNSPECIFIED 05/16/2007  . OSTEOARTHRITIS 05/16/2007  . Mixed hyperlipidemia 04/10/2007  . Coronary atherosclerosis 04/10/2007  . Bacterial pneumonia 04/10/2007  . OSTEOPOROSIS 04/10/2007  . Anxiety state 03/20/2007  . Depression 03/20/2007  . CHEST PAIN, RIGHT 03/20/2007    Past Surgical History:  Procedure Laterality Date  . broken jaw     1970-80's   . cardiac catherization  07-14-01  . COLONOSCOPY  2013  . ESOPHAGOGASTRODUODENOSCOPY  10-06-01  . stress cardiolite  06-30-01  . UPPER GASTROINTESTINAL ENDOSCOPY      Prior to Admission medications   Medication Sig Start Date End Date Taking? Authorizing Provider  alum & mag hydroxide-simeth (MAALOX MAX) 400-400-40 MG/5ML suspension Take 5 mLs by mouth every 6 (six) hours as needed for indigestion. 04/04/18   Rudene Re, MD  aspirin 81 MG tablet Take 81 mg by mouth daily.      [provider]  cholecalciferol (VITAMIN D) 1000 UNITS tablet Take 2,000 Units by mouth daily.     [provider]  diazepam (VALIUM) 10 MG tablet TAKE ONE-HALF TO ONE TABLET BY MOUTH EVERY 12 HOURS AS NEEDED FOR ANXIETY 01/20/18  Plotnikov, Evie Lacks, MD  ezetimibe (ZETIA) 10 MG tablet Take 1 tablet (10 mg total) by mouth daily. 06/22/17   Plotnikov, Evie Lacks, MD  famotidine (PEPCID) 20 MG tablet Take 1 tablet (20 mg total) by mouth 2 (two) times daily. 04/04/18 04/04/19  Rudene Re, MD  losartan (COZAAR) 100 MG tablet Take 1 tablet (100 mg total) by mouth daily. Yearly physical due in March must see MD for refills 06/22/17   Plotnikov, Evie Lacks, MD  nitroGLYCERIN (NITROSTAT) 0.4 MG SL tablet Place 1 tablet (0.4 mg total) under the  tongue every 5 (five) minutes as needed for chest pain. Patient not taking: Reported on 03/21/2018 06/22/17   Plotnikov, Evie Lacks, MD  ondansetron (ZOFRAN ODT) 4 MG disintegrating tablet Take 1 tablet (4 mg total) by mouth every 8 (eight) hours as needed for nausea or vomiting. 04/04/18   Alfred Levins, [place], MD  umeclidinium-vilanterol [facility] ELLIPTA) 62.5-25 MCG/INH AEPB Inhale 1 puff into the lungs daily. 12/08/17   Plotnikov, Evie Lacks, MD    Allergies Atorvastatin; Bee venom; Crestor [rosuvastatin calcium]; and Statins  Family History  Problem Relation Age of Onset  . Cancer Brother        lymphoma  . Heart disease Father   . Colon cancer Neg Hx   . Esophageal cancer Neg Hx   . Rectal cancer Neg Hx   . Stomach cancer Neg Hx   . Colon polyps Neg Hx     Social History Social History   Tobacco Use  . Smoking status: Former Smoker    Packs/day: 2.00    Types: Cigarettes    Last attempt to quit: 11/02/2012    Years since quitting: 5.4  . Smokeless tobacco: Never Used  Substance Use Topics  . Alcohol use: Yes    Alcohol/week: 12.0 standard drinks    Types: 12 Cans of beer per week  . Drug use: Yes    Types: Marijuana    Comment: marijuana    Review of Systems  Constitutional: Negative for fever. Eyes: Negative for visual changes. ENT: Negative for sore throat. Neck: No neck pain  Cardiovascular: Negative for chest pain. Respiratory: Negative for shortness of breath. Gastrointestinal: + diffuse abdominal pain, nausea, and vomiting. No diarrhea. Genitourinary: Negative for dysuria. Musculoskeletal: Negative for back pain. Skin: Negative for rash. Neurological: Negative for headaches, weakness or numbness. Psych: No SI or HI  ____________________________________________   PHYSICAL EXAM:  VITAL SIGNS: ED Triage Vitals  Enc Vitals Group     BP 04/04/18 2010 (!) 160/123     Pulse Rate 04/04/18 2010 67     Resp 04/04/18 2010 18     Temp 04/04/18 2010 98.5 F (36.9  C)     Temp Source 04/04/18 2010 Oral     SpO2 04/04/18 2010 100 %     Weight 04/04/18 2011 198 lb (89.8 kg)     Height 04/04/18 2011 5\' 9"  (1.753 m)     Head Circumference --      Peak Flow --      Pain Score 04/04/18 2011 8     Pain Loc --      Pain Edu? --      Excl. in [place]? --     Constitutional: Alert and oriented, looks uncomfortable holding his abdomen.  HEENT:      Head: Normocephalic and atraumatic.         Eyes: Conjunctivae are normal. Sclera is non-icteric.       Mouth/Throat: Mucous membranes  are moist.       Neck: Supple with no signs of meningismus. Cardiovascular: Regular rate and rhythm. No murmurs, gallops, or rubs. 2+ symmetrical distal pulses are present in all extremities. No JVD. Respiratory: Normal respiratory effort. Lungs are clear to auscultation bilaterally. No wheezes, crackles, or rhonchi.  Gastrointestinal: Soft, pain is out of proportion to exam with no real tenderness elicited on palpation, nondistended, positive bowel sounds, no rebound or guarding Genitourinary: No CVA tenderness. Musculoskeletal: Nontender with normal range of motion in all extremities. No edema, cyanosis, or erythema of extremities. Neurologic: Normal speech and language. Face is symmetric. Moving all extremities. No gross focal neurologic deficits are appreciated. Skin: Skin is warm, dry and intact. No rash noted. Psychiatric: Mood and affect are normal. Speech and behavior are normal.  ____________________________________________   LABS (all labs ordered are listed, but only abnormal results are displayed)  Labs Reviewed  CBC WITH DIFFERENTIAL/PLATELET - Abnormal; Notable for the following components:      Result Value   HCT 38.2 (*)    MCV 78.5 (*)    RDW 17.2 (*)    Neutro Abs 7.7 (*)    All other components within normal limits  COMPREHENSIVE METABOLIC PANEL - Abnormal; Notable for the following components:   Glucose, Bld 154 (*)    Total Protein 8.7 (*)    AST 46  (*)    All other components within normal limits  URINALYSIS, COMPLETE (UACMP) WITH MICROSCOPIC - Abnormal; Notable for the following components:   Color, Urine STRAW (*)    APPearance CLEAR (*)    All other components within normal limits  LIPASE, BLOOD  TROPONIN I  TROPONIN I   ____________________________________________  EKG  ED ECG REPORT I, Rudene Re, the attending physician, personally viewed and interpreted this ECG.  Signs bradycardia first-degree AV block, rate of 59, normal QRS and QTC, normal axis, no ST elevations or depressions. ____________________________________________  RADIOLOGY  I have personally reviewed the images performed during this visit and I agree with the Radiologist's read.   Interpretation by Radiologist:  No results found.    ____________________________________________   PROCEDURES  Procedure(s) performed: None Procedures Critical Care performed:  None ____________________________________________   INITIAL IMPRESSION / ASSESSMENT AND PLAN / ED COURSE   67 y.o. male with history of CAD, hypertension, hyperlipidemia status post colonoscopy this morning who presents for abdominal pain, nausea and vomiting since 4 PM.  Patient underwent a colonoscopy with 2 polyp removals this morning.  Patient looks uncomfortable with normal vital signs, pain is out of proportion to exam with no distention, no real tenderness on palpation, no rebound or guarding.  Differential diagnoses including mesenteric ischemia versus bowel perforation versus SBO. Will give morphine, zofran, IVF. Labs WNL. CT pending.    _________________________ 10:30 PM on 04/04/2018 -----------------------------------------  CT showing no signs of dissection, mesenteric ischemia, or any other acute findings.  Since patient has a history of coronary artery disease I will continue to monitor him and repeat troponin in 3 hours and if that is negative anticipate  discharge.    Clinical Course as of Apr 08 1523  Tue Apr 04, 2018  2343 Patient reports feeling better after GI cocktail.  CT negative for any acute findings, labs are within normal limits, troponin x2 is negative.  Patient does endorse having acid reflux over the last 6 months.  Will put patient on Pepcid and Zofran.  Recommend follow-up with his doctor tomorrow for reevaluation if he still having  pain.  Discussed my standard return precautions   [CV]    Clinical Course User Index [CV] Alfred Levins [place], MD    As part of my medical decision making, I reviewed the following data within the [place] History obtained from family, Nursing notes reviewed and incorporated, Labs reviewed , EKG interpreted , Old EKG reviewed, Old chart reviewed, Radiograph reviewed , Notes from prior ED visits and [place] Controlled Substance Database    Pertinent labs & imaging results that were available during my care of the patient were reviewed by me and considered in my medical decision making (see chart for details).    ____________________________________________   FINAL CLINICAL IMPRESSION(S) / ED DIAGNOSES  Final diagnoses:  Generalized abdominal pain  Gastroesophageal reflux disease, esophagitis presence not specified      NEW MEDICATIONS STARTED DURING THIS VISIT:  ED Discharge Orders         Ordered    alum & mag hydroxide-simeth (MAALOX MAX) 897-847-84 MG/5ML suspension  Every 6 hours PRN     04/04/18 2344    ondansetron (ZOFRAN ODT) 4 MG disintegrating tablet  Every 8 hours PRN     04/04/18 2344    famotidine (PEPCID) 20 MG tablet  2 times daily     04/04/18 2344           Note:  This document was prepared using Dragon voice recognition software and may include unintentional dictation errors.    Rudene Re, MD 04/04/18 [place], [place], MD 04/07/18 [phone]

## 2018-04-05 ENCOUNTER — Telehealth: Payer: Self-pay

## 2018-04-05 NOTE — Telephone Encounter (Signed)
Patient returned my f/o phone call this am after I had left a message for him. He states that he has something to eat and took a nap after her returned home from his procedure. He woke up with abdominal pain and distention and vomited X3. He states he called our emergency number and spoke with Dr. Carlean Purl, then proceeded to the emergency room at [facility]. Patient states they did "tests", but could not tell me what they were. They were reportedly normal per patient. They prescribed Maalox and Pepcid and sent him home. Patient states he got only an hour of sleep last night, still bloated. Belching some, passing small amount of gas, denies rectal bleeding. Please advise. Thank you.

## 2018-04-05 NOTE — Telephone Encounter (Signed)
Attempted to reach patient for post-procedure f/u call. No answer. Left message that we will make another attempt to reach him later today and to please call us if he has any questions/concerns regarding his care.

## 2018-04-05 NOTE — Telephone Encounter (Signed)
Thank you for your quick response, Dr. Carlean Purl.

## 2018-04-05 NOTE — Telephone Encounter (Signed)
I reviewed ED records and imaging and labs  Essentially negative w/u for any type of structural problem/complication  He does feel 'a whole lot better now"  Told him think he will continue to improve - if he has any persistent issues that do not resolve in a few days to call back (or if worse)

## 2018-04-07 ENCOUNTER — Ambulatory Visit: Payer: Self-pay | Admitting: *Deleted

## 2018-04-07 NOTE — Telephone Encounter (Signed)
Pt called back and said that he had been having abd pain with bloating since his colonoscopy. He had vomiting a couple of days ago but none now. He is not able to eat much. He feels bloated and he is burping. Is not passing gas. Usually he has no problem with passing gas.  He was calling to get a referral for a GI provider and was informed that Dr. Carlean Purl is a GI doctor  He called his GI doctor and he suggested several things which did not make him feel better, so he  went to the emergency department. Had several tests done and a CT scan. He was prescribed several meds for his GI. Pt stated that he feels somewhat better today but his pain is still #8. Per protocol pt should go to the emergency department to be assessed. He is not drinking much and has pain. Pt refuses to go and states he will be ok over the weekend.  Advised that if he starts having increase in symptoms, to go to the emergency department.

## 2018-04-07 NOTE — Telephone Encounter (Signed)
  Reason for Disposition . [1] Drinking very little AND [2] dehydration suspected (e.g., no urine > 12 hours, very dry mouth, very lightheaded) . [1] SEVERE pain AND [2] age > 73  Answer Assessment - Initial Assessment Questions 1. DATE/TIME: "When did you have your colonosocpy?"      April 04, 2018 2. MAIN CONCERN: "What is your main concern right now?" "What questions do you have?"     abd pain 3. ABDOMEN PAIN: "Are you having any abdomen (belly or stomach) pain?" If Yes, "How bad is it?" (e.g., Scale 1-10; mild, moderate, severe).    - MILD (1-3): doesn't interfere with normal activities, abdomen soft and not tender to touch     - MODERATE (4-7): interferes with normal activities or awakens from sleep, tender to touch     - SEVERE (8-10): excruciating pain, doubled over, unable to do any normal activities       Pain # 8 4. OTHER SYMPTOMS: "What other symptoms are you having?" (e.g., rectal bleeding, bloating or feeling gassy, passing gas, vomiting, dizziness, fever).     Bloating, burping 5. ONSET: "When did your symptoms start?"     Right after the colonoscopy 6. PATTERN: "Is the symptom(s) constant or does it come and go?" "Is your symptom(s) getting worse, better, or staying the same?"     Constant. Pain is better today.  Protocols used: COLONOSCOPY SYMPTOMS AND QUESTIONS-A-AH, ABDOMINAL PAIN - MALE-A-AH

## 2018-04-07 NOTE — Telephone Encounter (Signed)
Attempted to call patient as requested by Animal nutritionist at [place] office. Pt left message that he was having stomach pains after recent colonoscopy. Pt was seen by GI this week.  Clarification needed regarding the reason for a referral. No answer, left message for pt to call the office back.

## 2018-04-07 NOTE — Telephone Encounter (Signed)
  Answer Assessment - Initial Assessment Questions 1. LOCATION: "Where does it hurt?"      Whole abd 2. RADIATION: "Does the pain shoot anywhere else?" (e.g., chest, back)     no 3. ONSET: "When did the pain begin?" (Minutes, hours or days ago)      Tuesday 4. SUDDEN: "Gradual or sudden onset?"     sudden 5. PATTERN "Does the pain come and go, or is it constant?"    - If constant: "Is it getting better, staying the same, or worsening?"      (Note: Constant means the pain never goes away completely; most serious pain is constant and it progresses)     - If intermittent: "How long does it last?" "Do you have pain now?"     (Note: Intermittent means the pain goes away completely between bouts)     Nothing makes it better 6. SEVERITY: "How bad is the pain?"  (e.g., Scale 1-10; mild, moderate, or severe)    - MILD (1-3): doesn't interfere with normal activities, abdomen soft and not tender to touch     - MODERATE (4-7): interferes with normal activities or awakens from sleep, tender to touch     - SEVERE (8-10): excruciating pain, doubled over, unable to do any normal activities       Pain #8 -10 7. RECURRENT SYMPTOM: "Have you ever had this type of abdominal pain before?" If so, ask: "When was the last time?" and "What happened that time?"      no 8. CAUSE: "What do you think is causing the abdominal pain?"     Not sure 9. RELIEVING/AGGRAVATING FACTORS: "What makes it better or worse?" (e.g., movement, antacids, bowel movement)     Not sure 10. OTHER SYMPTOMS: "Has there been any vomiting, diarrhea, constipation, or urine problems?"       Vomited on Tuesday, last bm maybe small one since having the colonosccopy  Protocols used: ABDOMINAL PAIN - MALE-A-AH

## 2018-04-10 ENCOUNTER — Telehealth: Payer: Self-pay | Admitting: Internal Medicine

## 2018-04-10 ENCOUNTER — Encounter: Payer: Self-pay | Admitting: Family

## 2018-04-10 ENCOUNTER — Ambulatory Visit (INDEPENDENT_AMBULATORY_CARE_PROVIDER_SITE_OTHER): Payer: Medicare HMO | Admitting: Family

## 2018-04-10 VITALS — BP 150/82 | HR 73 | Temp 98.7°F | Ht 69.0 in | Wt 194.1 lb

## 2018-04-10 DIAGNOSIS — R109 Unspecified abdominal pain: Secondary | ICD-10-CM

## 2018-04-10 MED ORDER — PANTOPRAZOLE SODIUM 40 MG PO TBEC: 40.0000 mg | DELAYED_RELEASE_TABLET | Freq: Two times a day (BID) | ORAL | 3 refills | Status: DC

## 2018-04-10 NOTE — Progress Notes (Signed)
[name] is a 67 y.o. male with the following history as recorded in [employer]:  Patient Active Problem List   Diagnosis Date Noted  . Cerumen impaction 01/20/2018  . Tear of MCL (medial collateral ligament) of knee, left, initial encounter 11/03/2017  . Knee pain, left 10/19/2017  . Blood donor 10/27/2016  . Hypertensive heart disease 06/17/2015  . 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 Chest pain 08/09/2014  . Personal history of colonic polyps - adenoma 04/17/2012  . Well adult exam 03/08/2012  . Erectile dysfunction 03/08/2012  . Alcohol abuse 03/08/2012  . Infected cyst of skin 10/07/2011  . History of tobacco abuse 10/07/2011  . COPD (chronic obstructive pulmonary disease) ([place]) 11/10/2010  . Sebaceous cyst 11/10/2010  . Vitamin D deficiency 06/01/2010  . ACUTE BRONCHITIS 08/26/2009  . Diarrhea 10/08/2008  . NECK PAIN 09/16/2008  . SINUSITIS- ACUTE-NOS 09/26/2007  . ATONY OF BLADDER 07/11/2007  . Cough 07/05/2007  . PNEUMONIA, ORGANISM UNSPECIFIED 05/16/2007  . OSTEOARTHRITIS 05/16/2007  . Mixed hyperlipidemia 04/10/2007  . Coronary atherosclerosis 04/10/2007  . Bacterial pneumonia 04/10/2007  . OSTEOPOROSIS 04/10/2007  . Anxiety state 03/20/2007  . Depression 03/20/2007  . CHEST PAIN, RIGHT 03/20/2007    Current Outpatient Medications  Medication Sig Dispense Refill  . alum & mag hydroxide-simeth (MAALOX MAX) 400-400-40 MG/5ML suspension Take 5 mLs by mouth every 6 (six) hours as needed for indigestion. 355 mL 0  . aspirin 81 MG tablet Take 81 mg by mouth daily.      . cholecalciferol (VITAMIN D) 1000 UNITS tablet Take 2,000 Units by mouth daily.     . diazepam (VALIUM) 10 MG tablet TAKE ONE-HALF TO ONE TABLET BY MOUTH EVERY 12 HOURS AS NEEDED FOR ANXIETY 60 tablet 3  . ezetimibe (ZETIA) 10 MG tablet Take 1 tablet (10 mg total) by mouth daily. 90 tablet 3  . losartan (COZAAR) 100 MG tablet Take 1 tablet (100 mg total) by mouth daily. Yearly physical due  in March must see MD for refills 90 tablet 3  . ondansetron (ZOFRAN ODT) 4 MG disintegrating tablet Take 1 tablet (4 mg total) by mouth every 8 (eight) hours as needed for nausea or vomiting. 20 tablet 0  . umeclidinium-vilanterol (ANORO ELLIPTA) 62.5-25 MCG/INH AEPB Inhale 1 puff into the lungs daily. 1 each 11  . nitroGLYCERIN (NITROSTAT) 0.4 MG SL tablet Place 1 tablet (0.4 mg total) under the tongue every 5 (five) minutes as needed for chest pain. (Patient not taking: Reported on 03/21/2018) 20 tablet 3  . pantoprazole (PROTONIX) 40 MG tablet Take 1 tablet (40 mg total) by mouth 2 (two) times daily before a meal. 30 tablet 3   No current facility-administered medications for this visit.     Allergies: Atorvastatin; Bee venom; Crestor [rosuvastatin calcium]; and Statins  Past Medical History:  Diagnosis Date  . Allergy   . Allergy to bee sting   . Anxiety   . CAD (coronary artery disease)    statin intolerant  . Cyst   . Depression   . Dermatitis due to sun   . ED (erectile dysfunction)   . Hyperlipidemia   . Hypertension   . MI (myocardial infarction) ([place]) 2002  . OA (osteoarthritis)   . Tobacco abuse   . Vitamin D deficiency 2008    Past Surgical History:  Procedure Laterality Date  . broken jaw     1970-80's   . cardiac catherization  07-14-01  . COLONOSCOPY  2013  .  ESOPHAGOGASTRODUODENOSCOPY  10-06-01  . stress cardiolite  06-30-01  . UPPER GASTROINTESTINAL ENDOSCOPY      Family History  Problem Relation Age of Onset  . Cancer Brother        lymphoma  . Heart disease Father   . Colon cancer Neg Hx   . Esophageal cancer Neg Hx   . Rectal cancer Neg Hx   . Stomach cancer Neg Hx   . Colon polyps Neg Hx     Social History   Tobacco Use  . Smoking status: Former Smoker    Packs/day: 2.00    Types: Cigarettes    Last attempt to quit: 11/02/2012    Years since quitting: 5.4  . Smokeless tobacco: Never Used  Substance Use Topics  . Alcohol use: Yes     Alcohol/week: 12.0 standard drinks    Types: 12 Cans of beer per week    Subjective:  Patient had colonoscopy on Tuesday, October 1; notes that he woke up with severe abdominal pain/ bloating after procedure; went to the ER due to the severity of pain- had normal labs and CT; requesting referral back to his GI today to discuss further treatment. Was given Pepcid at ER with limited relief; has been using some of his wife's Protonix and does feel some improvement in symptoms.    Objective:  Vitals:   04/10/18 1120  BP: (!) 150/82  Pulse: 73  Temp: 98.7 F (37.1 C)  TempSrc: Oral  SpO2: 95%  Weight: 194 lb 1.9 oz (88.1 kg)  Height: 5\' 9"  (1.753 m)    General: Well developed, well nourished, in no acute distress  Skin : Warm and dry.  Head: Normocephalic and atraumatic  Lungs: Respirations unlabored; clear to auscultation bilaterally without wheeze, rales, rhonchi  CVS exam: normal rate and regular rhythm.  Abdomen: Soft; nontender; nondistended; normoactive bowel sounds; no masses or hepatosplenomegaly  Neurologic: Alert and oriented; speech intact; face symmetrical; moves all extremities well; CNII-XII intact without focal deficit  Assessment:  1. Abdominal pain, unspecified abdominal location     Plan:  Reviewed recent ER notes; will not repeat labs today; referral to GI as requested/ notified GI as well about continued symptoms; Rx for Protonix 40 mg bid; follow-up to be determined.   No follow-ups on file.  Orders Placed This Encounter  Procedures  . Ambulatory referral to Gastroenterology    Referral Priority:   Routine    Referral Type:   Consultation    Referral Reason:   Specialty Services Required    Referred to Provider:   Gatha Mayer, MD    Number of Visits Requested:   1    Requested Prescriptions   Signed Prescriptions Disp Refills  . pantoprazole (PROTONIX) 40 MG tablet 30 tablet 3    Sig: Take 1 tablet (40 mg total) by mouth 2 (two) times daily before a  meal.

## 2018-04-10 NOTE — Telephone Encounter (Signed)
Message from Dr. Quay Burow re: continued abdominal pain I calle dthe patient - he is having intermittent abd pain after colonoscopy the other week  CT scan in ED was ok the night of procedure  I had an opening at 145 PM tomorrow and we will schedule that.  He knows to come.

## 2018-04-10 NOTE — Telephone Encounter (Signed)
Seeing [name] today at 80

## 2018-04-11 ENCOUNTER — Ambulatory Visit: Payer: Medicare HMO | Admitting: Internal Medicine

## 2018-04-11 ENCOUNTER — Ambulatory Visit (INDEPENDENT_AMBULATORY_CARE_PROVIDER_SITE_OTHER)
Admission: RE | Admit: 2018-04-11 | Discharge: 2018-04-11 | Disposition: A | Discharge status: Home / Self Care | Payer: Medicare HMO | Source: Ambulatory Visit | Attending: Internal Medicine | Admitting: Internal Medicine

## 2018-04-11 ENCOUNTER — Encounter: Payer: Self-pay | Admitting: Internal Medicine

## 2018-04-11 VITALS — BP 100/60 | HR 76 | Ht 68.25 in | Wt 196.2 lb

## 2018-04-11 DIAGNOSIS — R109 Unspecified abdominal pain: Secondary | ICD-10-CM | POA: Diagnosis not present

## 2018-04-11 DIAGNOSIS — R140 Abdominal distension (gaseous): Secondary | ICD-10-CM

## 2018-04-11 DIAGNOSIS — R1084 Generalized abdominal pain: Secondary | ICD-10-CM | POA: Diagnosis not present

## 2018-04-11 DIAGNOSIS — R0981 Nasal congestion: Secondary | ICD-10-CM

## 2018-04-11 DIAGNOSIS — R065 Mouth breathing: Secondary | ICD-10-CM | POA: Diagnosis not present

## 2018-04-11 MED ORDER — DICYCLOMINE HCL 20 MG PO TABS: 20.0000 mg | ORAL_TABLET | Freq: Four times a day (QID) | ORAL | 0 refills | Status: DC | PRN

## 2018-04-11 NOTE — Progress Notes (Signed)
[name] 67 y.o. 04/26/1951 [id number]  Assessment & Plan:   Encounter Diagnoses  Name Primary?  . Generalized abdominal pain Yes  . Abdominal distention   . Nasal congestion   . Chronic mouth breathing    I do not think his problems are related to his colonoscopy though that may have aggravated them.  He did have problems prior at least once.  I think he is mouth breathing and swallowing air I see this frequently in people with COPD.  We will check an abdominal film. Dicyclomine 20 mg every 6 hours as needed abdominal cramps Simethicone as needed Go back to primary care regarding chronic nasal congestion to see if that helps his mouth breathing i.e. open up his nasal passages.  see me as needed otherwise I reassured him as best I can  I appreciate the opportunity to care for this patient. CC: Plotnikov, Evie Lacks, MD   Subjective:   Chief Complaint: abdominal pain and distention  HPI The patient is here with his significant other, I did a colonoscopy last week it went well a couple of cold snare polyps but he had bloating distention and vomiting and went to the ER where labs and a CT angiography of the abdomen and pelvis were negative.  Since then he has had intermittent abdominal distention and generalized pain.  It seems to occur after taking a nap.  He is a mouth breather due to chronic nasal congestion plus minus COPD.  Bowel habits are slowly returning he has been a bit constipated but his appetite has been off.  When questioned further he does admit to having at least one episode of abdominal distention like this prior to his colonoscopy. Allergies  Allergen Reactions  . Atorvastatin     REACTION: arthralgia  . Bee Venom Swelling  . Crestor [Rosuvastatin Calcium]     pains  . Statins Other (See Comments)    Myalgia   Current Meds  Medication Sig  . alum & mag hydroxide-simeth (MAALOX MAX) 400-400-40 MG/5ML suspension Take 5 mLs by mouth every 6 (six)  hours as needed for indigestion.  [name] aspirin 81 MG tablet Take 81 mg by mouth daily.    . cholecalciferol (VITAMIN D) 1000 UNITS tablet Take 2,000 Units by mouth daily.   . diazepam (VALIUM) 10 MG tablet TAKE ONE-HALF TO ONE TABLET BY MOUTH EVERY 12 HOURS AS NEEDED FOR ANXIETY  . ezetimibe (ZETIA) 10 MG tablet Take 1 tablet (10 mg total) by mouth daily.  [name] losartan (COZAAR) 100 MG tablet Take 1 tablet (100 mg total) by mouth daily. Yearly physical due in March must see MD for refills  . ondansetron (ZOFRAN ODT) 4 MG disintegrating tablet Take 1 tablet (4 mg total) by mouth every 8 (eight) hours as needed for nausea or vomiting.  . pantoprazole (PROTONIX) 40 MG tablet Take 1 tablet (40 mg total) by mouth 2 (two) times daily before a meal.  . umeclidinium-vilanterol (ANORO ELLIPTA) 62.5-25 MCG/INH AEPB Inhale 1 puff into the lungs daily.   Past Medical History:  Diagnosis Date  . Allergy   . Allergy to bee sting   . Anxiety   . CAD (coronary artery disease)    statin intolerant  . Cyst   . Depression   . Dermatitis due to sun   . ED (erectile dysfunction)   . Hyperlipidemia   . Hypertension   . MI (myocardial infarction) ([place]) 2002  . OA (osteoarthritis)   . Tobacco abuse   .  Vitamin D deficiency 2008   Past Surgical History:  Procedure Laterality Date  . broken jaw     1970-80's   . cardiac catherization  07-14-01  . COLONOSCOPY  2013  . ESOPHAGOGASTRODUODENOSCOPY  10-06-01  . stress cardiolite  06-30-01  . UPPER GASTROINTESTINAL ENDOSCOPY     Social History   Social History Narrative   Regular exercise-no   family history includes Cancer in his brother; Heart disease in his father.   Review of Systems See HPI  Objective:   Physical Exam BP 100/60 (BP Location: Left Arm, Patient Position: Sitting, Cuff Size: Normal)   Pulse 76   Ht 5' 8.25" (1.734 m) Comment: height measured without shoes  Wt 196 lb 4 oz (89 kg)   BMI 29.62 kg/m  Mildly chronically ill but in no  acute distress The lung sounds are diffusely decreased bilaterally Heart sounds are distant S1 and S2 The abdomen is soft without organomegaly or mass he is mildly tender in the epigastrium to deep palpation there is no pain with muscle tension i.e. negative carnett sign

## 2018-04-11 NOTE — Patient Instructions (Signed)
Please go to the x-ray department today for abdominal films.   Please see your PCP for your nasal congestion.   You may take Gas X for the bloating.   We have sent the following medications to your pharmacy for you to pick up at your convenience: Dicyclomine    I appreciate the opportunity to care for you. Silvano Rusk, MD, [facility]

## 2018-04-12 ENCOUNTER — Encounter: Payer: Self-pay | Admitting: Internal Medicine

## 2018-04-12 NOTE — Progress Notes (Signed)
X ray is ok  Let him know  Hopefully what I Rxed and recommended yesterday is helping

## 2018-04-12 NOTE — Progress Notes (Signed)
2 adenomas recall 2024 My Chart

## 2018-05-23 ENCOUNTER — Other Ambulatory Visit (INDEPENDENT_AMBULATORY_CARE_PROVIDER_SITE_OTHER): Payer: Medicare HMO

## 2018-05-23 ENCOUNTER — Ambulatory Visit (INDEPENDENT_AMBULATORY_CARE_PROVIDER_SITE_OTHER): Payer: Medicare HMO | Admitting: Internal Medicine

## 2018-05-23 ENCOUNTER — Encounter: Payer: Self-pay | Admitting: Internal Medicine

## 2018-05-23 DIAGNOSIS — I2510 Atherosclerotic heart disease of native coronary artery without angina pectoris: Secondary | ICD-10-CM

## 2018-05-23 DIAGNOSIS — Z0000 Encounter for general adult medical examination without abnormal findings: Secondary | ICD-10-CM

## 2018-05-23 DIAGNOSIS — R69 Illness, unspecified: Secondary | ICD-10-CM | POA: Diagnosis not present

## 2018-05-23 DIAGNOSIS — N320 Bladder-neck obstruction: Secondary | ICD-10-CM | POA: Diagnosis not present

## 2018-05-23 DIAGNOSIS — J449 Chronic obstructive pulmonary disease, unspecified: Secondary | ICD-10-CM | POA: Diagnosis not present

## 2018-05-23 DIAGNOSIS — K219 Gastro-esophageal reflux disease without esophagitis: Secondary | ICD-10-CM

## 2018-05-23 DIAGNOSIS — E785 Hyperlipidemia, unspecified: Secondary | ICD-10-CM | POA: Diagnosis not present

## 2018-05-23 DIAGNOSIS — F411 Generalized anxiety disorder: Secondary | ICD-10-CM

## 2018-05-23 DIAGNOSIS — I119 Hypertensive heart disease without heart failure: Secondary | ICD-10-CM

## 2018-05-23 LAB — BASIC METABOLIC PANEL
BUN: 17 mg/dL (ref 6–23)
CALCIUM: 9.6 mg/dL (ref 8.4–10.5)
CO2: 27 mEq/L (ref 19–32)
Chloride: 103 mEq/L (ref 96–112)
Creatinine, Ser: 1.04 mg/dL (ref 0.40–1.50)
GFR: 75.65 mL/min (ref >60.00)
GLUCOSE: 101 mg/dL — AB (ref 70–99)
POTASSIUM: 3.9 meq/L (ref 3.5–5.1)
SODIUM: 139 meq/L (ref 135–145)

## 2018-05-23 LAB — URINALYSIS
BILIRUBIN URINE: NEGATIVE
Hgb urine dipstick: NEGATIVE
KETONES UR: NEGATIVE
Leukocytes, UA: NEGATIVE
Nitrite: NEGATIVE
PH: 6 (ref 5.0–8.0)
SPECIFIC GRAVITY, URINE: 1.02 (ref 1.000–1.030)
TOTAL PROTEIN, URINE-UPE24: NEGATIVE
URINE GLUCOSE: NEGATIVE
Urobilinogen, UA: 0.2 (ref 0.0–1.0)

## 2018-05-23 LAB — CBC WITH DIFFERENTIAL/PLATELET
Basophils Absolute: 0.1 10*3/uL (ref 0.0–0.1)
Basophils Relative: 0.9 % (ref 0.0–3.0)
Eosinophils Absolute: 0.1 10*3/uL (ref 0.0–0.7)
Eosinophils Relative: 1.3 % (ref 0.0–5.0)
HEMATOCRIT: 38.1 % — AB (ref 39.0–52.0)
Hemoglobin: 12.9 g/dL — ABNORMAL LOW (ref 13.0–17.0)
LYMPHS ABS: 2.2 10*3/uL (ref 0.7–4.0)
LYMPHS PCT: 28.2 % (ref 12.0–46.0)
MCHC: 34 g/dL (ref 30.0–36.0)
MCV: 78.1 fl (ref 78.0–100.0)
MONOS PCT: 8.9 % (ref 3.0–12.0)
Monocytes Absolute: 0.7 10*3/uL (ref 0.1–1.0)
NEUTROS ABS: 4.7 10*3/uL (ref 1.4–7.7)
NEUTROS PCT: 60.7 % (ref 43.0–77.0)
PLATELETS: 209 10*3/uL (ref 150.0–400.0)
RBC: 4.88 Mil/uL (ref 4.22–5.81)
RDW: 17.8 % — ABNORMAL HIGH (ref 11.5–15.5)
WBC: 7.7 10*3/uL (ref 4.0–10.5)

## 2018-05-23 LAB — TSH: TSH: 8 u[IU]/mL — ABNORMAL HIGH (ref 0.35–4.50)

## 2018-05-23 LAB — LIPID PANEL
Cholesterol: 257 mg/dL — ABNORMAL HIGH (ref 0–200)
HDL: 31 mg/dL — AB (ref >39.00)
Total CHOL/HDL Ratio: 8
Triglycerides: 1165 mg/dL — ABNORMAL HIGH (ref 0.0–149.0)

## 2018-05-23 LAB — HEPATIC FUNCTION PANEL
ALT: 31 U/L (ref 0–53)
AST: 31 U/L (ref 0–37)
Albumin: 4.3 g/dL (ref 3.5–5.2)
Alkaline Phosphatase: 62 U/L (ref 39–117)
BILIRUBIN TOTAL: 0.3 mg/dL (ref 0.2–1.2)
Bilirubin, Direct: 0.1 mg/dL (ref 0.0–0.3)
TOTAL PROTEIN: 8.1 g/dL (ref 6.0–8.3)

## 2018-05-23 LAB — PSA: PSA: 1.86 ng/mL (ref 0.10–4.00)

## 2018-05-23 LAB — LDL CHOLESTEROL, DIRECT: LDL DIRECT: 39 mg/dL

## 2018-05-23 MED ORDER — EZETIMIBE 10 MG PO TABS: 10.0000 mg | ORAL_TABLET | Freq: Every day | ORAL | 3 refills | Status: DC

## 2018-05-23 MED ORDER — PANTOPRAZOLE SODIUM 40 MG PO TBEC: 40.0000 mg | DELAYED_RELEASE_TABLET | Freq: Every day | ORAL | 3 refills | Status: DC

## 2018-05-23 MED ORDER — DIAZEPAM 10 MG PO TABS: ORAL_TABLET | ORAL | 3 refills | Status: DC

## 2018-05-23 MED ORDER — LOSARTAN POTASSIUM 100 MG PO TABS: 100.0000 mg | ORAL_TABLET | Freq: Every day | ORAL | 3 refills | Status: DC

## 2018-05-23 NOTE — Assessment & Plan Note (Signed)
Protonix.  ?

## 2018-05-23 NOTE — Assessment & Plan Note (Signed)
Losartan 

## 2018-05-23 NOTE — Assessment & Plan Note (Signed)
Quit 5 years ago.  

## 2018-05-23 NOTE — Assessment & Plan Note (Signed)
Here for medicare wellness/physical  Diet: heart healthy  Physical activity: not sedentary  Depression/mood screen: negative  Hearing: intact to whispered voice w/hearing aids Visual acuity: grossly normal w/glasses, performs annual eye exam  ADLs: capable  Fall risk: low to none  Home safety: good  Cognitive evaluation: intact to orientation, naming, recall and repetition  EOL planning: adv directives, full code/ I agree  I have personally reviewed and have noted  1. The patient's medical, surgical and social history  2. Their use of alcohol, tobacco or illicit drugs  3. Their current medications and supplements  4. The patient's functional ability including ADL's, fall risks, home safety risks and hearing or visual impairment.  5. Diet and physical activities  6. Evidence for depression or mood disorders - anxiety 7. The roster of all physicians providing medical care to patient - is listed in the Snapshot section of the chart and reviewed today.    Today patient counseled on age appropriate routine health concerns for screening and prevention, each reviewed and up to date or declined. Immunizations reviewed and up to date or declined. Labs ordered and reviewed. Risk factors for depression reviewed and negative. Hearing function and visual acuity are intact. ADLs screened and addressed as needed. Functional ability and level of safety reviewed and appropriate. Education, counseling and referrals performed based on assessed risks today. Patient provided with a copy of personalized plan for preventive services.    Colon done in 2013, 2019 Dr Carlean Purl

## 2018-05-23 NOTE — Assessment & Plan Note (Signed)
Diazepam prn   Potential benefits of a long term benzodiazepines  use as well as potential risks  and complications were explained to the patient and were aknowledged. Not using Rx w/alcohol

## 2018-05-23 NOTE — Patient Instructions (Signed)

## 2018-05-23 NOTE — Assessment & Plan Note (Signed)
ASA Zetia

## 2018-05-23 NOTE — Progress Notes (Signed)
Subjective:  Patient ID: [name], male    DOB: January 14, 1951  Age: 67 y.o. MRN: [id number]  CC: No chief complaint on file.   HPI [name] presents for anxiety, HTN, GERD f/u Well exam  Outpatient Medications Prior to Visit  Medication Sig Dispense Refill  . aspirin 81 MG tablet Take 81 mg by mouth daily.      . cholecalciferol (VITAMIN D) 1000 UNITS tablet Take 2,000 Units by mouth daily.     . diazepam (VALIUM) 10 MG tablet TAKE ONE-HALF TO ONE TABLET BY MOUTH EVERY 12 HOURS AS NEEDED FOR ANXIETY 60 tablet 3  . ezetimibe (ZETIA) 10 MG tablet Take 1 tablet (10 mg total) by mouth daily. 90 tablet 3  . losartan (COZAAR) 100 MG tablet Take 1 tablet (100 mg total) by mouth daily. Yearly physical due in March must see MD for refills 90 tablet 3  . nitroGLYCERIN (NITROSTAT) 0.4 MG SL tablet Place 1 tablet (0.4 mg total) under the tongue every 5 (five) minutes as needed for chest pain. 20 tablet 3  . pantoprazole (PROTONIX) 40 MG tablet Take 1 tablet (40 mg total) by mouth 2 (two) times daily before a meal. 30 tablet 3  . alum & mag hydroxide-simeth (MAALOX MAX) 400-400-40 MG/5ML suspension Take 5 mLs by mouth every 6 (six) hours as needed for indigestion. 355 mL 0  . dicyclomine (BENTYL) 20 MG tablet Take 1 tablet (20 mg total) by mouth every 6 (six) hours as needed (abdominal pain and distention). 90 tablet 0  . ondansetron (ZOFRAN ODT) 4 MG disintegrating tablet Take 1 tablet (4 mg total) by mouth every 8 (eight) hours as needed for nausea or vomiting. 20 tablet 0  . umeclidinium-vilanterol (ANORO ELLIPTA) 62.5-25 MCG/INH AEPB Inhale 1 puff into the lungs daily. 1 each 11   No facility-administered medications prior to visit.     ROS: Review of Systems  Constitutional: Negative for appetite change, fatigue and unexpected weight change.  HENT: Negative for congestion, nosebleeds, sneezing, sore throat and trouble swallowing.   Eyes: Negative for itching and visual  disturbance.  Respiratory: Negative for cough.   Cardiovascular: Negative for chest pain, palpitations and leg swelling.  Gastrointestinal: Negative for abdominal distention, blood in stool, diarrhea and nausea.  Genitourinary: Negative for frequency and hematuria.  Musculoskeletal: Positive for arthralgias. Negative for back pain, gait problem, joint swelling and neck pain.  Skin: Negative for rash.  Neurological: Negative for dizziness, tremors, speech difficulty and weakness.  Psychiatric/Behavioral: Negative for agitation, dysphoric mood and sleep disturbance. The patient is nervous/anxious.     Objective:  BP 106/68 (BP Location: Left Arm, Patient Position: Sitting, Cuff Size: Large)   Pulse 67   Temp 97.6 F (36.4 C) (Oral)   Ht 5' 8.25" (1.734 m)   Wt 197 lb (89.4 kg)   SpO2 96%   BMI 29.73 kg/m   BP Readings from Last 3 Encounters:  05/23/18 106/68  04/11/18 100/60  04/10/18 (!) 150/82    Wt Readings from Last 3 Encounters:  05/23/18 197 lb (89.4 kg)  04/11/18 196 lb 4 oz (89 kg)  04/10/18 194 lb 1.9 oz (88.1 kg)    Physical Exam  Constitutional: He is oriented to person, place, and time. He appears well-developed. No distress.  NAD  HENT:  Mouth/Throat: Oropharynx is clear and moist.  Eyes: Pupils are equal, round, and reactive to light. Conjunctivae are normal.  Neck: Normal range of motion. No JVD present.  No thyromegaly present.  Cardiovascular: Normal rate, regular rhythm, normal heart sounds and intact distal pulses. Exam reveals no gallop and no friction rub.  No murmur heard. Pulmonary/Chest: Effort normal and breath sounds normal. No respiratory distress. He has no wheezes. He has no rales. He exhibits no tenderness.  Abdominal: Soft. Bowel sounds are normal. He exhibits no distension and no mass. There is no tenderness. There is no rebound and no guarding.  Musculoskeletal: Normal range of motion. He exhibits no edema or tenderness.  Lymphadenopathy:     He has no cervical adenopathy.  Neurological: He is alert and oriented to person, place, and time. He has normal reflexes. No cranial nerve deficit. He exhibits normal muscle tone. He displays a negative Romberg sign. Coordination and gait normal.  Skin: Skin is warm and dry. No rash noted.  Psychiatric: He has a normal mood and affect. His behavior is normal. Judgment and thought content normal.  LS tender Rectal - per GI   Lab Results  Component Value Date   WBC 9.6 04/04/2018   HGB 13.3 04/04/2018   HCT 38.2 (L) 04/04/2018   PLT 182 04/04/2018   GLUCOSE 154 (H) 04/04/2018   CHOL 178 06/22/2017   TRIG (H) 06/22/2017    416.0 Triglyceride is over 400; calculations on Lipids are invalid.   HDL 45.60 06/22/2017   LDLDIRECT 57.0 06/22/2017   LDLCALC 59 03/01/2012   ALT 41 04/04/2018   AST 46 (H) 04/04/2018   NA 139 04/04/2018   K 3.7 04/04/2018   CL 103 04/04/2018   CREATININE 1.12 04/04/2018   BUN 13 04/04/2018   CO2 27 04/04/2018   TSH 4.06 07/27/2017   PSA 2.53 06/22/2017    Dg Abd 2 Views  Result Date: 04/12/2018 CLINICAL DATA:  Abdominal pain and distention EXAM: ABDOMEN - 2 VIEW COMPARISON:  CT abdomen 04/04/2018 FINDINGS: Normal bowel gas pattern.  No free air. Lumbar scoliosis, mild.  Mild arterial calcification. IMPRESSION: Normal bowel gas pattern Electronically Signed   By: Franchot Gallo M.D.   On: 04/12/2018 08:19    Assessment & Plan:   There are no diagnoses linked to this encounter.   No orders of the defined types were placed in this encounter.    Follow-up: No follow-ups on file.  Walker Kehr, MD

## 2018-05-25 ENCOUNTER — Other Ambulatory Visit: Payer: Self-pay | Admitting: Internal Medicine

## 2018-05-25 DIAGNOSIS — E785 Hyperlipidemia, unspecified: Secondary | ICD-10-CM

## 2018-05-25 MED ORDER — LEVOTHYROXINE SODIUM 50 MCG PO TABS: 50.0000 ug | ORAL_TABLET | Freq: Every day | ORAL | 11 refills | Status: DC

## 2018-05-25 MED ORDER — PITAVASTATIN CALCIUM 2 MG PO TABS: 1.0000 | ORAL_TABLET | Freq: Every day | ORAL | 11 refills | Status: DC

## 2018-07-10 ENCOUNTER — Other Ambulatory Visit (INDEPENDENT_AMBULATORY_CARE_PROVIDER_SITE_OTHER): Payer: Medicare HMO

## 2018-07-10 DIAGNOSIS — E785 Hyperlipidemia, unspecified: Secondary | ICD-10-CM | POA: Diagnosis not present

## 2018-07-10 LAB — HEPATIC FUNCTION PANEL
ALK PHOS: 45 U/L (ref 39–117)
ALT: 39 U/L (ref 0–53)
AST: 35 U/L (ref 0–37)
Albumin: 4.1 g/dL (ref 3.5–5.2)
BILIRUBIN TOTAL: 0.4 mg/dL (ref 0.2–1.2)
Bilirubin, Direct: 0.1 mg/dL (ref 0.0–0.3)
Total Protein: 8.1 g/dL (ref 6.0–8.3)

## 2018-07-10 LAB — LDL CHOLESTEROL, DIRECT: LDL DIRECT: 35 mg/dL

## 2018-07-10 LAB — TSH: TSH: 6.15 u[IU]/mL — ABNORMAL HIGH (ref 0.35–4.50)

## 2018-07-10 LAB — LIPID PANEL
Cholesterol: 141 mg/dL (ref 0–200)
HDL: 41.6 mg/dL (ref >39.00)
NONHDL: 99.38
Total CHOL/HDL Ratio: 3
Triglycerides: 378 mg/dL — ABNORMAL HIGH (ref 0.0–149.0)
VLDL: 75.6 mg/dL — AB (ref 0.0–40.0)

## 2018-07-12 ENCOUNTER — Other Ambulatory Visit: Payer: Self-pay | Admitting: Internal Medicine

## 2018-07-12 MED ORDER — LEVOTHYROXINE SODIUM 75 MCG PO TABS: 75.0000 ug | ORAL_TABLET | Freq: Every day | ORAL | 11 refills | Status: DC

## 2018-07-16 ENCOUNTER — Other Ambulatory Visit: Payer: Self-pay | Admitting: Internal Medicine

## 2018-07-16 MED ORDER — LEVOTHYROXINE SODIUM 75 MCG PO TABS: 75.0000 ug | ORAL_TABLET | Freq: Every day | ORAL | 3 refills | Status: DC

## 2018-08-06 ENCOUNTER — Other Ambulatory Visit: Payer: Self-pay | Admitting: Internal Medicine

## 2018-08-06 MED ORDER — PRAVASTATIN SODIUM 20 MG PO TABS: 20.0000 mg | ORAL_TABLET | Freq: Every day | ORAL | 3 refills | Status: DC

## 2018-08-15 DIAGNOSIS — R69 Illness, unspecified: Secondary | ICD-10-CM | POA: Diagnosis not present

## 2018-08-24 ENCOUNTER — Encounter: Payer: Self-pay | Admitting: Internal Medicine

## 2018-08-24 ENCOUNTER — Ambulatory Visit (INDEPENDENT_AMBULATORY_CARE_PROVIDER_SITE_OTHER): Payer: Medicare HMO | Admitting: Internal Medicine

## 2018-08-24 DIAGNOSIS — R911 Solitary pulmonary nodule: Secondary | ICD-10-CM | POA: Diagnosis not present

## 2018-08-24 DIAGNOSIS — E039 Hypothyroidism, unspecified: Secondary | ICD-10-CM

## 2018-08-24 DIAGNOSIS — I2510 Atherosclerotic heart disease of native coronary artery without angina pectoris: Secondary | ICD-10-CM | POA: Diagnosis not present

## 2018-08-24 MED ORDER — DIAZEPAM 10 MG PO TABS: ORAL_TABLET | ORAL | 3 refills | Status: DC

## 2018-08-24 NOTE — Assessment & Plan Note (Signed)
CT chest 5/19: Occasional nodular opacities noted, largest measuring 4 mm. No follow-up needed if patient is low-risk (and has no known or suspected primary neoplasm). Non-contrast chest CT can be considered in 12 months - CT in May 2020

## 2018-08-24 NOTE — Assessment & Plan Note (Signed)
Labs

## 2018-08-24 NOTE — Assessment & Plan Note (Signed)
ASA, Pravastatin Risks associated with treatment noncompliance were discussed. Compliance was encouraged.

## 2018-08-24 NOTE — Patient Instructions (Signed)
Hypothyroidism  Hypothyroidism is when the thyroid gland does not make enough of certain hormones (it is underactive). The thyroid gland is a small gland located in the lower front part of the neck, just in front of the windpipe (trachea). This gland makes hormones that help control how the body uses food for energy (metabolism) as well as how the heart and brain function. These hormones also play a role in keeping your bones strong. When the thyroid is underactive, it produces too little of the hormones thyroxine (T4) and triiodothyronine (T3). What are the causes? This condition may be caused by:  Hashimoto's disease. This is a disease in which the body's disease-fighting system (immune system) attacks the thyroid gland. This is the most common cause.  Viral infections.  Pregnancy.  Certain medicines.  Birth defects.  Past radiation treatments to the head or neck for cancer.  Past treatment with radioactive iodine.  Past exposure to radiation in the environment.  Past surgical removal of part or all of the thyroid.  Problems with a gland in the center of the brain (pituitary gland).  Lack of enough iodine in the diet. What increases the risk? You are more likely to develop this condition if:  You are male.  You have a family history of thyroid conditions.  You use a medicine called lithium.  You take medicines that affect the immune system (immunosuppressants). What are the signs or symptoms? Symptoms of this condition include:  Feeling as though you have no energy (lethargy).  Not being able to tolerate cold.  Weight gain that is not explained by a change in diet or exercise habits.  Lack of appetite.  Dry skin.  Coarse hair.  Menstrual irregularity.  Slowing of thought processes.  Constipation.  Sadness or depression. How is this diagnosed? This condition may be diagnosed based on:  Your symptoms, your medical history, and a physical exam.  Blood  tests. You may also have imaging tests, such as an ultrasound or MRI. How is this treated? This condition is treated with medicine that replaces the thyroid hormones that your body does not make. After you begin treatment, it may take several weeks for symptoms to go away. Follow these instructions at home:  Take over-the-counter and prescription medicines only as told by your health care provider.  If you start taking any new medicines, tell your health care provider.  Keep all follow-up visits as told by your health care provider. This is important. ? As your condition improves, your dosage of thyroid hormone medicine may change. ? You will need to have blood tests regularly so that your health care provider can monitor your condition. Contact a health care provider if:  Your symptoms do not get better with treatment.  You are taking thyroid replacement medicine and you: ? Sweat a lot. ? Have tremors. ? Feel anxious. ? Lose weight rapidly. ? Cannot tolerate heat. ? Have emotional swings. ? Have diarrhea. ? Feel weak. Get help right away if you have:  Chest pain.  An irregular heartbeat.  A rapid heartbeat.  Difficulty breathing. Summary  Hypothyroidism is when the thyroid gland does not make enough of certain hormones (it is underactive).  When the thyroid is underactive, it produces too little of the hormones thyroxine (T4) and triiodothyronine (T3).  The most common cause is Hashimoto's disease, a disease in which the body's disease-fighting system (immune system) attacks the thyroid gland. The condition can also be caused by viral infections, medicine, pregnancy, or past   radiation treatment to the head or neck.  Symptoms may include weight gain, dry skin, constipation, feeling as though you do not have energy, and not being able to tolerate cold.  This condition is treated with medicine to replace the thyroid hormones that your body does not make. This information  is not intended to replace advice given to you by your health care provider. Make sure you discuss any questions you have with your health care provider. Document Released: 06/21/2005 Document Revised: 06/01/2017 Document Reviewed: 06/01/2017 Elsevier Interactive Patient Education  2019 [employer] Inc[employer]  

## 2018-08-24 NOTE — Progress Notes (Signed)
Subjective:  Patient ID: [name], male    DOB: Oct 01, 1950  Age: 68 y.o. MRN: [id number]  CC: No chief complaint on file.   HPI [name] presents for hypothyroidism, anxiety, dyslipidemia f/u  Outpatient Medications Prior to Visit  Medication Sig Dispense Refill  . aspirin 81 MG tablet Take 81 mg by mouth daily.      . cholecalciferol (VITAMIN D) 1000 UNITS tablet Take 2,000 Units by mouth daily.     . diazepam (VALIUM) 10 MG tablet TAKE ONE-HALF TO ONE TABLET BY MOUTH EVERY 12 HOURS AS NEEDED FOR ANXIETY 60 tablet 3  . ezetimibe (ZETIA) 10 MG tablet Take 1 tablet (10 mg total) by mouth daily. 90 tablet 3  . levothyroxine (SYNTHROID, LEVOTHROID) 75 MCG tablet Take 1 tablet (75 mcg total) by mouth daily. 90 tablet 3  . losartan (COZAAR) 100 MG tablet Take 1 tablet (100 mg total) by mouth daily. Yearly physical due in March must see MD for refills 90 tablet 3  . nitroGLYCERIN (NITROSTAT) 0.4 MG SL tablet Place 1 tablet (0.4 mg total) under the tongue every 5 (five) minutes as needed for chest pain. 20 tablet 3  . pantoprazole (PROTONIX) 40 MG tablet Take 1 tablet (40 mg total) by mouth daily. 90 tablet 3  . pravastatin (PRAVACHOL) 20 MG tablet Take 1 tablet (20 mg total) by mouth daily. 90 tablet 3   No facility-administered medications prior to visit.     ROS: Review of Systems  Constitutional: Negative for appetite change, fatigue and unexpected weight change.  HENT: Negative for congestion, nosebleeds, sneezing, sore throat and trouble swallowing.   Eyes: Negative for itching and visual disturbance.  Respiratory: Negative for cough and wheezing.   Cardiovascular: Negative for chest pain, palpitations and leg swelling.  Gastrointestinal: Negative for abdominal distention, blood in stool, diarrhea and nausea.  Genitourinary: Negative for frequency and hematuria.  Musculoskeletal: Positive for arthralgias and back pain. Negative for gait problem, joint swelling and  neck pain.  Skin: Negative for rash.  Neurological: Negative for dizziness, tremors, speech difficulty and weakness.  Psychiatric/Behavioral: Negative for agitation, dysphoric mood, sleep disturbance and suicidal ideas. The patient is nervous/anxious.     Objective:  BP 118/66 (BP Location: Left Arm, Patient Position: Sitting, Cuff Size: Large)   Pulse 61   Temp 98 F (36.7 C) (Oral)   Ht 5' 8.25" (1.734 m)   Wt 201 lb (91.2 kg)   SpO2 93%   BMI 30.34 kg/m   BP Readings from Last 3 Encounters:  08/24/18 118/66  05/23/18 106/68  04/11/18 100/60    Wt Readings from Last 3 Encounters:  08/24/18 201 lb (91.2 kg)  05/23/18 197 lb (89.4 kg)  04/11/18 196 lb 4 oz (89 kg)    Physical Exam Constitutional:      General: He is not in acute distress.    Appearance: He is well-developed.     Comments: NAD  Eyes:     Conjunctiva/sclera: Conjunctivae normal.     Pupils: Pupils are equal, round, and reactive to light.  Neck:     Musculoskeletal: Normal range of motion.     Thyroid: No thyromegaly.     Vascular: No JVD.  Cardiovascular:     Rate and Rhythm: Normal rate and regular rhythm.     Heart sounds: Normal heart sounds. No murmur. No friction rub. No gallop.   Pulmonary:     Effort: Pulmonary effort is normal. No respiratory distress.  Breath sounds: Normal breath sounds. No wheezing or rales.  Chest:     Chest wall: No tenderness.  Abdominal:     General: Bowel sounds are normal. There is no distension.     Palpations: Abdomen is soft. There is no mass.     Tenderness: There is no abdominal tenderness. There is no guarding or rebound.  Musculoskeletal: Normal range of motion.        General: Tenderness present.  Lymphadenopathy:     Cervical: No cervical adenopathy.  Skin:    General: Skin is warm and dry.     Findings: No rash.  Neurological:     Mental Status: He is alert and oriented to person, place, and time.     Cranial Nerves: No cranial nerve deficit.       Motor: No abnormal muscle tone.     Coordination: Coordination normal.     Gait: Gait normal.     Deep Tendon Reflexes: Reflexes are normal and symmetric.  Psychiatric:        Behavior: Behavior normal.        Thought Content: Thought content normal.        Judgment: Judgment normal.     Lab Results  Component Value Date   WBC 7.7 05/23/2018   HGB 12.9 (L) 05/23/2018   HCT 38.1 (L) 05/23/2018   PLT 209.0 05/23/2018   GLUCOSE 101 (H) 05/23/2018   CHOL 141 07/10/2018   TRIG 378.0 (H) 07/10/2018   HDL 41.60 07/10/2018   LDLDIRECT 35.0 07/10/2018   LDLCALC 59 03/01/2012   ALT 39 07/10/2018   AST 35 07/10/2018   NA 139 05/23/2018   K 3.9 05/23/2018   CL 103 05/23/2018   CREATININE 1.04 05/23/2018   BUN 17 05/23/2018   CO2 27 05/23/2018   TSH 6.15 (H) 07/10/2018   PSA 1.86 05/23/2018    Dg Abd 2 Views  Result Date: 04/12/2018 CLINICAL DATA:  Abdominal pain and distention EXAM: ABDOMEN - 2 VIEW COMPARISON:  CT abdomen 04/04/2018 FINDINGS: Normal bowel gas pattern.  No free air. Lumbar scoliosis, mild.  Mild arterial calcification. IMPRESSION: Normal bowel gas pattern Electronically Signed   By: Franchot Gallo M.D.   On: 04/12/2018 08:19    Assessment & Plan:   There are no diagnoses linked to this encounter.   No orders of the defined types were placed in this encounter.    Follow-up: No follow-ups on file.  Walker Kehr, MD

## 2018-08-28 ENCOUNTER — Other Ambulatory Visit (INDEPENDENT_AMBULATORY_CARE_PROVIDER_SITE_OTHER): Payer: Medicare HMO

## 2018-08-28 DIAGNOSIS — E039 Hypothyroidism, unspecified: Secondary | ICD-10-CM | POA: Diagnosis not present

## 2018-08-28 DIAGNOSIS — I2510 Atherosclerotic heart disease of native coronary artery without angina pectoris: Secondary | ICD-10-CM | POA: Diagnosis not present

## 2018-08-28 LAB — HEPATIC FUNCTION PANEL
ALBUMIN: 4.1 g/dL (ref 3.5–5.2)
ALT: 41 U/L (ref 0–53)
AST: 37 U/L (ref 0–37)
Alkaline Phosphatase: 52 U/L (ref 39–117)
Bilirubin, Direct: 0.1 mg/dL (ref 0.0–0.3)
Total Bilirubin: 0.4 mg/dL (ref 0.2–1.2)
Total Protein: 8.2 g/dL (ref 6.0–8.3)

## 2018-08-28 LAB — BASIC METABOLIC PANEL
BUN: 13 mg/dL (ref 6–23)
CO2: 28 mEq/L (ref 19–32)
Calcium: 9.5 mg/dL (ref 8.4–10.5)
Chloride: 102 mEq/L (ref 96–112)
Creatinine, Ser: 1.16 mg/dL (ref 0.40–1.50)
GFR: 62.7 mL/min (ref >60.00)
Glucose, Bld: 89 mg/dL (ref 70–99)
Potassium: 4.1 mEq/L (ref 3.5–5.1)
Sodium: 138 mEq/L (ref 135–145)

## 2018-08-28 LAB — LIPID PANEL
Cholesterol: 177 mg/dL (ref 0–200)
HDL: 43.5 mg/dL (ref >39.00)
Total CHOL/HDL Ratio: 4
Triglycerides: 476 mg/dL — ABNORMAL HIGH (ref 0.0–149.0)

## 2018-08-28 LAB — TSH: TSH: 4.35 u[IU]/mL (ref 0.35–4.50)

## 2018-08-28 LAB — LDL CHOLESTEROL, DIRECT: Direct LDL: 44 mg/dL

## 2018-11-29 ENCOUNTER — Ambulatory Visit (INDEPENDENT_AMBULATORY_CARE_PROVIDER_SITE_OTHER): Payer: Medicare HMO | Admitting: Internal Medicine

## 2018-11-29 ENCOUNTER — Encounter: Payer: Self-pay | Admitting: Internal Medicine

## 2018-11-29 DIAGNOSIS — R69 Illness, unspecified: Secondary | ICD-10-CM | POA: Diagnosis not present

## 2018-11-29 DIAGNOSIS — F411 Generalized anxiety disorder: Secondary | ICD-10-CM | POA: Diagnosis not present

## 2018-11-29 DIAGNOSIS — E039 Hypothyroidism, unspecified: Secondary | ICD-10-CM

## 2018-11-29 DIAGNOSIS — I119 Hypertensive heart disease without heart failure: Secondary | ICD-10-CM | POA: Diagnosis not present

## 2018-11-29 DIAGNOSIS — I2510 Atherosclerotic heart disease of native coronary artery without angina pectoris: Secondary | ICD-10-CM | POA: Diagnosis not present

## 2018-11-29 NOTE — Assessment & Plan Note (Signed)
Diazepam prn Chronic  Potential benefits of a long term benzodiazepines  use as well as potential risks  and complications were explained to the patient and were aknowledged. Not using Rx w/alcohol

## 2018-11-29 NOTE — Assessment & Plan Note (Signed)
Continue with Levothroid

## 2018-11-29 NOTE — Progress Notes (Signed)
Virtual Visit via Video Note  I connected with [name] on 11/29/18 at  9:10 AM EDT by a video enabled telemedicine application and verified that I am speaking with the correct person using two identifiers.   I discussed the limitations of evaluation and management by telemedicine and the availability of in person appointments. The patient expressed understanding and agreed to proceed.  History of Present Illness: We need to follow-up on anxiety, dyslipidemia, hypothyroidism  There has been no runny nose, cough, chest pain, shortness of breath, abdominal pain, diarrhea, constipation, arthralgias, skin rashes.   Observations/Objective: The patient appears to be in no acute distress, looks well.  Assessment and Plan:  See my Assessment and Plan. Follow Up Instructions:    I discussed the assessment and treatment plan with the patient. The patient was provided an opportunity to ask questions and all were answered. The patient agreed with the plan and demonstrated an understanding of the instructions.   The patient was advised to call back or seek an in-person evaluation if the symptoms worsen or if the condition fails to improve as anticipated.  I provided face-to-face time during this encounter. We were at different locations.   Walker Kehr, MD

## 2018-11-29 NOTE — Assessment & Plan Note (Signed)
Continue with losartan 

## 2018-11-29 NOTE — Assessment & Plan Note (Signed)
Continue with Zetia, aspirin, pravastatin

## 2018-12-25 ENCOUNTER — Other Ambulatory Visit: Payer: Self-pay | Admitting: Internal Medicine

## 2018-12-26 NOTE — Telephone Encounter (Signed)
[facility] Controlled Database Checked Last filled: 11/24/18 # 60 LOV w/you: 11/29/18 Next appt w/you: None

## 2019-02-15 DIAGNOSIS — R69 Illness, unspecified: Secondary | ICD-10-CM | POA: Diagnosis not present

## 2019-02-16 ENCOUNTER — Other Ambulatory Visit: Payer: Self-pay | Admitting: Internal Medicine

## 2019-02-22 ENCOUNTER — Ambulatory Visit (INDEPENDENT_AMBULATORY_CARE_PROVIDER_SITE_OTHER): Payer: Medicare HMO | Admitting: Internal Medicine

## 2019-02-22 ENCOUNTER — Encounter: Payer: Self-pay | Admitting: Internal Medicine

## 2019-02-22 ENCOUNTER — Other Ambulatory Visit: Payer: Self-pay

## 2019-02-22 ENCOUNTER — Other Ambulatory Visit (INDEPENDENT_AMBULATORY_CARE_PROVIDER_SITE_OTHER): Payer: Medicare HMO

## 2019-02-22 VITALS — BP 140/72 | HR 68 | Temp 98.3°F | Ht 68.25 in | Wt 195.0 lb

## 2019-02-22 DIAGNOSIS — I119 Hypertensive heart disease without heart failure: Secondary | ICD-10-CM | POA: Diagnosis not present

## 2019-02-22 DIAGNOSIS — Z23 Encounter for immunization: Secondary | ICD-10-CM

## 2019-02-22 DIAGNOSIS — J449 Chronic obstructive pulmonary disease, unspecified: Secondary | ICD-10-CM | POA: Diagnosis not present

## 2019-02-22 DIAGNOSIS — F411 Generalized anxiety disorder: Secondary | ICD-10-CM | POA: Diagnosis not present

## 2019-02-22 DIAGNOSIS — I2510 Atherosclerotic heart disease of native coronary artery without angina pectoris: Secondary | ICD-10-CM

## 2019-02-22 DIAGNOSIS — R69 Illness, unspecified: Secondary | ICD-10-CM | POA: Diagnosis not present

## 2019-02-22 LAB — BASIC METABOLIC PANEL
BUN: 13 mg/dL (ref 6–23)
CO2: 25 mEq/L (ref 19–32)
Calcium: 9.2 mg/dL (ref 8.4–10.5)
Chloride: 104 mEq/L (ref 96–112)
Creatinine, Ser: 0.92 mg/dL (ref 0.40–1.50)
GFR: 81.81 mL/min (ref >60.00)
Glucose, Bld: 89 mg/dL (ref 70–99)
Potassium: 3.8 mEq/L (ref 3.5–5.1)
Sodium: 137 mEq/L (ref 135–145)

## 2019-02-22 MED ORDER — DIAZEPAM 10 MG PO TABS: ORAL_TABLET | ORAL | 3 refills | Status: DC

## 2019-02-22 NOTE — Progress Notes (Signed)
Subjective:  Patient ID: [name], male    DOB: [birth year]  Age: 68 y.o. MRN: [id number]  CC: No chief complaint on file.   HPI [name] presents for anxiety, CAD, COPD f/u  Outpatient Medications Prior to Visit  Medication Sig Dispense Refill   aspirin 81 MG tablet Take 81 mg by mouth daily.       cholecalciferol (VITAMIN D) 1000 UNITS tablet Take 2,000 Units by mouth daily.      diazepam (VALIUM) 10 MG tablet TAKE 1/2 TO 1 (ONE-HALF TO ONE) TABLET BY MOUTH EVERY 12 HOURS AS NEEDED FOR ANXIETY 60 tablet 3   ezetimibe (ZETIA) 10 MG tablet Take 1 tablet (10 mg total) by mouth daily. 90 tablet 3   levothyroxine (SYNTHROID, LEVOTHROID) 75 MCG tablet Take 1 tablet (75 mcg total) by mouth daily. 90 tablet 3   losartan (COZAAR) 100 MG tablet Take 1 tablet (100 mg total) by mouth daily. Yearly physical due in March must see MD for refills 90 tablet 3   nitroGLYCERIN (NITROSTAT) 0.4 MG SL tablet DISSOLVE ONE TABLET UNDER THE TONGUE EVERY 5 MINUTES AS NEEDED FOR CHEST PAIN.  DO NOT EXCEED A TOTAL OF 3 DOSES IN 15 MINUTES 25 tablet 0   pantoprazole (PROTONIX) 40 MG tablet Take 1 tablet (40 mg total) by mouth daily. 90 tablet 3   pravastatin (PRAVACHOL) 20 MG tablet Take 1 tablet (20 mg total) by mouth daily. 90 tablet 3   No facility-administered medications prior to visit.     ROS: Review of Systems  Constitutional: Negative for appetite change, fatigue and unexpected weight change.  HENT: Negative for congestion, nosebleeds, sneezing, sore throat and trouble swallowing.   Eyes: Negative for itching and visual disturbance.  Respiratory: Negative for cough.   Cardiovascular: Negative for chest pain, palpitations and leg swelling.  Gastrointestinal: Negative for abdominal distention, blood in stool, diarrhea and nausea.  Genitourinary: Negative for frequency and hematuria.  Musculoskeletal: Positive for arthralgias. Negative for back pain, gait problem, joint swelling  and neck pain.  Skin: Negative for rash.  Neurological: Negative for dizziness, tremors, speech difficulty and weakness.  Psychiatric/Behavioral: Negative for agitation, dysphoric mood, sleep disturbance and suicidal ideas. The patient is nervous/anxious.     Objective:  BP 140/72 (BP Location: Left Arm, Patient Position: Sitting, Cuff Size: Normal)    Pulse 68    Temp 98.3 F (36.8 C) (Oral)    Ht 5' 8.25" (1.734 m)    Wt 195 lb (88.5 kg)    SpO2 97%    BMI 29.43 kg/m   BP Readings from Last 3 Encounters:  02/22/19 140/72  08/24/18 118/66  05/23/18 106/68    Wt Readings from Last 3 Encounters:  02/22/19 195 lb (88.5 kg)  08/24/18 201 lb (91.2 kg)  05/23/18 197 lb (89.4 kg)    Physical Exam  Lab Results  Component Value Date   WBC 7.7 05/23/2018   HGB 12.9 (L) 05/23/2018   HCT 38.1 (L) 05/23/2018   PLT 209.0 05/23/2018   GLUCOSE 89 08/28/2018   CHOL 177 08/28/2018   TRIG 476.0 (H) 08/28/2018   HDL 43.50 08/28/2018   LDLDIRECT 44.0 08/28/2018   LDLCALC 59 03/01/2012   ALT 41 08/28/2018   AST 37 08/28/2018   NA 138 08/28/2018   K 4.1 08/28/2018   CL 102 08/28/2018   CREATININE 1.16 08/28/2018   BUN 13 08/28/2018   CO2 28 08/28/2018   TSH 4.35 08/28/2018  PSA 1.86 05/23/2018    Dg Abd 2 Views  Result Date: 04/12/2018 CLINICAL DATA:  Abdominal pain and distention EXAM: ABDOMEN - 2 VIEW COMPARISON:  CT abdomen 04/04/2018 FINDINGS: Normal bowel gas pattern.  No free air. Lumbar scoliosis, mild.  Mild arterial calcification. IMPRESSION: Normal bowel gas pattern Electronically Signed   By: Franchot Gallo M.D.   On: 04/12/2018 08:19    Assessment & Plan:   Diagnoses and all orders for this visit:  Need for influenza vaccination -     Flu Vaccine QUAD High Dose(Fluad)     No orders of the defined types were placed in this encounter.    Follow-up: No follow-ups on file.  Walker Kehr, MD

## 2019-02-22 NOTE — Assessment & Plan Note (Signed)
ASA, Pravastatin 

## 2019-02-22 NOTE — Assessment & Plan Note (Signed)
Diazepam prn Chronic  Potential benefits of a long term benzodiazepines  use as well as potential risks  and complications were explained to the patient and were aknowledged. Not using Rx w/alcohol

## 2019-02-22 NOTE — Patient Instructions (Addendum)
If you have medicare related insurance (such as traditional Medicare, [employer], [employer], or similar), Please make an appointment at the scheduling desk with Sharee Pimple, the [employer], for your Wellness visit in this office, which is a benefit with your insurance.   Mask extenders

## 2019-02-22 NOTE — Assessment & Plan Note (Signed)
[place]  °

## 2019-02-22 NOTE — Assessment & Plan Note (Signed)
Losartan 

## 2019-06-05 ENCOUNTER — Other Ambulatory Visit: Payer: Self-pay

## 2019-06-05 ENCOUNTER — Encounter: Payer: Self-pay | Admitting: Internal Medicine

## 2019-06-05 ENCOUNTER — Ambulatory Visit (INDEPENDENT_AMBULATORY_CARE_PROVIDER_SITE_OTHER): Payer: Medicare HMO | Admitting: Internal Medicine

## 2019-06-05 VITALS — BP 126/70 | HR 75 | Temp 98.3°F | Ht 68.25 in | Wt 200.0 lb

## 2019-06-05 DIAGNOSIS — R69 Illness, unspecified: Secondary | ICD-10-CM | POA: Diagnosis not present

## 2019-06-05 DIAGNOSIS — E039 Hypothyroidism, unspecified: Secondary | ICD-10-CM | POA: Diagnosis not present

## 2019-06-05 DIAGNOSIS — N320 Bladder-neck obstruction: Secondary | ICD-10-CM | POA: Diagnosis not present

## 2019-06-05 DIAGNOSIS — I2510 Atherosclerotic heart disease of native coronary artery without angina pectoris: Secondary | ICD-10-CM

## 2019-06-05 DIAGNOSIS — Z0000 Encounter for general adult medical examination without abnormal findings: Secondary | ICD-10-CM | POA: Diagnosis not present

## 2019-06-05 DIAGNOSIS — F411 Generalized anxiety disorder: Secondary | ICD-10-CM

## 2019-06-05 MED ORDER — LOSARTAN POTASSIUM 100 MG PO TABS: 100.0000 mg | ORAL_TABLET | Freq: Every day | ORAL | 3 refills | Status: DC

## 2019-06-05 MED ORDER — LEVOTHYROXINE SODIUM 75 MCG PO TABS: 75.0000 ug | ORAL_TABLET | Freq: Every day | ORAL | 3 refills | Status: DC

## 2019-06-05 MED ORDER — DIAZEPAM 10 MG PO TABS: ORAL_TABLET | ORAL | 3 refills | Status: DC

## 2019-06-05 MED ORDER — PRAVASTATIN SODIUM 20 MG PO TABS: 20.0000 mg | ORAL_TABLET | Freq: Every day | ORAL | 3 refills | Status: DC

## 2019-06-05 MED ORDER — PANTOPRAZOLE SODIUM 40 MG PO TBEC: 40.0000 mg | DELAYED_RELEASE_TABLET | Freq: Every day | ORAL | 3 refills | Status: DC

## 2019-06-05 MED ORDER — EZETIMIBE 10 MG PO TABS: 10.0000 mg | ORAL_TABLET | Freq: Every day | ORAL | 3 refills | Status: DC

## 2019-06-05 NOTE — Progress Notes (Signed)
Subjective:  Patient ID: [name], male    DOB: 1951/05/21  Age: 68 y.o. MRN: [id number]  CC: No chief complaint on file.   HPI [name] presents for a well exam F/u anxiety, hypothyroidism  Outpatient Medications Prior to Visit  Medication Sig Dispense Refill  . aspirin 81 MG tablet Take 81 mg by mouth daily.      . cholecalciferol (VITAMIN D) 1000 UNITS tablet Take 2,000 Units by mouth daily.     . diazepam (VALIUM) 10 MG tablet TAKE 1/2 TO 1 (ONE-HALF TO ONE) TABLET BY MOUTH EVERY 12 HOURS AS NEEDED FOR ANXIETY 60 tablet 3  . ezetimibe (ZETIA) 10 MG tablet Take 1 tablet (10 mg total) by mouth daily. 90 tablet 3  . levothyroxine (SYNTHROID, LEVOTHROID) 75 MCG tablet Take 1 tablet (75 mcg total) by mouth daily. 90 tablet 3  . losartan (COZAAR) 100 MG tablet Take 1 tablet (100 mg total) by mouth daily. Yearly physical due in March must see MD for refills 90 tablet 3  . nitroGLYCERIN (NITROSTAT) 0.4 MG SL tablet DISSOLVE ONE TABLET UNDER THE TONGUE EVERY 5 MINUTES AS NEEDED FOR CHEST PAIN.  DO NOT EXCEED A TOTAL OF 3 DOSES IN 15 MINUTES 25 tablet 0  . pantoprazole (PROTONIX) 40 MG tablet Take 1 tablet (40 mg total) by mouth daily. 90 tablet 3  . pravastatin (PRAVACHOL) 20 MG tablet Take 1 tablet (20 mg total) by mouth daily. 90 tablet 3   No facility-administered medications prior to visit.     ROS: Review of Systems  Constitutional: Positive for unexpected weight change. Negative for appetite change and fatigue.  HENT: Negative for congestion, nosebleeds, sneezing, sore throat and trouble swallowing.   Eyes: Negative for itching and visual disturbance.  Respiratory: Negative for cough.   Cardiovascular: Negative for chest pain, palpitations and leg swelling.  Gastrointestinal: Negative for abdominal distention, blood in stool, diarrhea and nausea.  Genitourinary: Negative for frequency and hematuria.  Musculoskeletal: Negative for back pain, gait problem, joint  swelling and neck pain.  Skin: Negative for rash.  Neurological: Negative for dizziness, tremors, speech difficulty and weakness.  Psychiatric/Behavioral: Negative for agitation, dysphoric mood, sleep disturbance and suicidal ideas. The patient is nervous/anxious.     Objective:  BP 126/70 (BP Location: Left Arm, Patient Position: Sitting, Cuff Size: Normal)   Pulse 75   Temp 98.3 F (36.8 C) (Oral)   Ht 5' 8.25" (1.734 m)   Wt 200 lb (90.7 kg)   SpO2 98%   BMI 30.19 kg/m   BP Readings from Last 3 Encounters:  06/05/19 126/70  02/22/19 140/72  08/24/18 118/66    Wt Readings from Last 3 Encounters:  06/05/19 200 lb (90.7 kg)  02/22/19 195 lb (88.5 kg)  08/24/18 201 lb (91.2 kg)    Physical Exam Constitutional:      General: He is not in acute distress.    Appearance: He is well-developed.     Comments: NAD  Eyes:     Conjunctiva/sclera: Conjunctivae normal.     Pupils: Pupils are equal, round, and reactive to light.  Neck:     Musculoskeletal: Normal range of motion.     Thyroid: No thyromegaly.     Vascular: No JVD.  Cardiovascular:     Rate and Rhythm: Normal rate and regular rhythm.     Heart sounds: Normal heart sounds. No murmur. No friction rub. No gallop.   Pulmonary:     Effort:  Pulmonary effort is normal. No respiratory distress.     Breath sounds: Normal breath sounds. No wheezing or rales.  Chest:     Chest wall: No tenderness.  Abdominal:     General: Bowel sounds are normal. There is no distension.     Palpations: Abdomen is soft. There is no mass.     Tenderness: There is no abdominal tenderness. There is no guarding or rebound.  Musculoskeletal: Normal range of motion.        General: No tenderness.  Lymphadenopathy:     Cervical: No cervical adenopathy.  Skin:    General: Skin is warm and dry.     Findings: No rash.  Neurological:     Mental Status: He is alert and oriented to person, place, and time.     Cranial Nerves: No cranial nerve  deficit.     Motor: No abnormal muscle tone.     Coordination: Coordination normal.     Gait: Gait normal.     Deep Tendon Reflexes: Reflexes are normal and symmetric.  Psychiatric:        Behavior: Behavior normal.        Thought Content: Thought content normal.        Judgment: Judgment normal.     rectal - per GI   Lab Results  Component Value Date   WBC 7.7 05/23/2018   HGB 12.9 (L) 05/23/2018   HCT 38.1 (L) 05/23/2018   PLT 209.0 05/23/2018   GLUCOSE 89 02/22/2019   CHOL 177 08/28/2018   TRIG 476.0 (H) 08/28/2018   HDL 43.50 08/28/2018   LDLDIRECT 44.0 08/28/2018   LDLCALC 59 03/01/2012   ALT 41 08/28/2018   AST 37 08/28/2018   NA 137 02/22/2019   K 3.8 02/22/2019   CL 104 02/22/2019   CREATININE 0.92 02/22/2019   BUN 13 02/22/2019   CO2 25 02/22/2019   TSH 4.35 08/28/2018   PSA 1.86 05/23/2018    Dg Abd 2 Views  Result Date: 04/12/2018 CLINICAL DATA:  Abdominal pain and distention EXAM: ABDOMEN - 2 VIEW COMPARISON:  CT abdomen 04/04/2018 FINDINGS: Normal bowel gas pattern.  No free air. Lumbar scoliosis, mild.  Mild arterial calcification. IMPRESSION: Normal bowel gas pattern Electronically Signed   By: Franchot Gallo M.D.   On: 04/12/2018 08:19    Assessment & Plan:   There are no diagnoses linked to this encounter.   No orders of the defined types were placed in this encounter.    Follow-up: No follow-ups on file.  Walker Kehr, MD

## 2019-06-05 NOTE — Assessment & Plan Note (Signed)
TSH 

## 2019-06-05 NOTE — Assessment & Plan Note (Signed)
ASA, Pravastatin 

## 2019-06-05 NOTE — Assessment & Plan Note (Signed)
We discussed age appropriate health related issues, including available/recomended screening tests and vaccinations. We discussed a need for adhering to healthy diet and exercise. Labs were ordered to be later reviewed . All questions were answered.  Colon done in 2013, 2019 Dr Carlean Purl

## 2019-06-05 NOTE — Assessment & Plan Note (Signed)
Diazepam prn Chronic  Potential benefits of a long term benzodiazepines  use as well as potential risks  and complications were explained to the patient and were aknowledged. Not using Rx w/alcohol

## 2019-06-28 ENCOUNTER — Telehealth: Payer: Self-pay | Admitting: Internal Medicine

## 2019-06-28 NOTE — Telephone Encounter (Signed)
Pharmacy stated they have a different manufacturer for levothyroxine (SYNTHROID) 75 MCG tablet Requesting confirmation it is okay to fill with new manufacturer.  [place], [address] Phone:  [phone]  Fax:  [phone]

## 2019-07-02 NOTE — Telephone Encounter (Signed)
Verbal okay given.  

## 2019-07-27 ENCOUNTER — Ambulatory Visit: Payer: Medicare HMO | Attending: Internal Medicine

## 2019-07-27 DIAGNOSIS — Z20822 Contact with and (suspected) exposure to covid-19: Secondary | ICD-10-CM

## 2019-07-28 LAB — NOVEL CORONAVIRUS, NAA: SARS-CoV-2, NAA: NOT DETECTED

## 2019-09-03 DIAGNOSIS — R69 Illness, unspecified: Secondary | ICD-10-CM | POA: Diagnosis not present

## 2019-09-04 ENCOUNTER — Other Ambulatory Visit: Payer: Self-pay

## 2019-09-04 ENCOUNTER — Ambulatory Visit (INDEPENDENT_AMBULATORY_CARE_PROVIDER_SITE_OTHER): Payer: Medicare HMO | Admitting: Internal Medicine

## 2019-09-04 ENCOUNTER — Other Ambulatory Visit (INDEPENDENT_AMBULATORY_CARE_PROVIDER_SITE_OTHER): Payer: Medicare HMO

## 2019-09-04 ENCOUNTER — Encounter: Payer: Self-pay | Admitting: Internal Medicine

## 2019-09-04 DIAGNOSIS — I2510 Atherosclerotic heart disease of native coronary artery without angina pectoris: Secondary | ICD-10-CM | POA: Diagnosis not present

## 2019-09-04 DIAGNOSIS — E782 Mixed hyperlipidemia: Secondary | ICD-10-CM | POA: Diagnosis not present

## 2019-09-04 DIAGNOSIS — F411 Generalized anxiety disorder: Secondary | ICD-10-CM | POA: Diagnosis not present

## 2019-09-04 DIAGNOSIS — J449 Chronic obstructive pulmonary disease, unspecified: Secondary | ICD-10-CM

## 2019-09-04 DIAGNOSIS — Z52008 Unspecified donor, other blood: Secondary | ICD-10-CM | POA: Diagnosis not present

## 2019-09-04 DIAGNOSIS — I252 Old myocardial infarction: Secondary | ICD-10-CM

## 2019-09-04 DIAGNOSIS — E039 Hypothyroidism, unspecified: Secondary | ICD-10-CM

## 2019-09-04 DIAGNOSIS — N320 Bladder-neck obstruction: Secondary | ICD-10-CM | POA: Diagnosis not present

## 2019-09-04 DIAGNOSIS — Z0000 Encounter for general adult medical examination without abnormal findings: Secondary | ICD-10-CM

## 2019-09-04 DIAGNOSIS — R972 Elevated prostate specific antigen [PSA]: Secondary | ICD-10-CM | POA: Diagnosis not present

## 2019-09-04 DIAGNOSIS — K219 Gastro-esophageal reflux disease without esophagitis: Secondary | ICD-10-CM | POA: Diagnosis not present

## 2019-09-04 DIAGNOSIS — E559 Vitamin D deficiency, unspecified: Secondary | ICD-10-CM

## 2019-09-04 DIAGNOSIS — R69 Illness, unspecified: Secondary | ICD-10-CM | POA: Diagnosis not present

## 2019-09-04 LAB — URINALYSIS
Bilirubin Urine: NEGATIVE
Hgb urine dipstick: NEGATIVE
Ketones, ur: NEGATIVE
Leukocytes,Ua: NEGATIVE
Nitrite: NEGATIVE
Specific Gravity, Urine: 1.015 (ref 1.000–1.030)
Total Protein, Urine: NEGATIVE
Urine Glucose: NEGATIVE
Urobilinogen, UA: 0.2 (ref 0.0–1.0)
pH: 7 (ref 5.0–8.0)

## 2019-09-04 LAB — BASIC METABOLIC PANEL
BUN: 16 mg/dL (ref 6–23)
CO2: 27 mEq/L (ref 19–32)
Calcium: 9.2 mg/dL (ref 8.4–10.5)
Chloride: 100 mEq/L (ref 96–112)
Creatinine, Ser: 1.03 mg/dL (ref 0.40–1.50)
GFR: 71.7 mL/min (ref >60.00)
Glucose, Bld: 100 mg/dL — ABNORMAL HIGH (ref 70–99)
Potassium: 3.7 mEq/L (ref 3.5–5.1)
Sodium: 136 mEq/L (ref 135–145)

## 2019-09-04 LAB — TSH: TSH: 3.33 u[IU]/mL (ref 0.35–4.50)

## 2019-09-04 LAB — HEPATIC FUNCTION PANEL
ALT: 43 U/L (ref 0–53)
AST: 34 U/L (ref 0–37)
Albumin: 3.9 g/dL (ref 3.5–5.2)
Alkaline Phosphatase: 54 U/L (ref 39–117)
Bilirubin, Direct: 0.1 mg/dL (ref 0.0–0.3)
Total Bilirubin: 0.4 mg/dL (ref 0.2–1.2)
Total Protein: 8 g/dL (ref 6.0–8.3)

## 2019-09-04 LAB — CBC WITH DIFFERENTIAL/PLATELET
Basophils Absolute: 0 10*3/uL (ref 0.0–0.1)
Basophils Relative: 0.8 % (ref 0.0–3.0)
Eosinophils Absolute: 0.1 10*3/uL (ref 0.0–0.7)
Eosinophils Relative: 1.1 % (ref 0.0–5.0)
HCT: 37.6 % — ABNORMAL LOW (ref 39.0–52.0)
Hemoglobin: 12.5 g/dL — ABNORMAL LOW (ref 13.0–17.0)
Lymphocytes Relative: 26.8 % (ref 12.0–46.0)
Lymphs Abs: 1.7 10*3/uL (ref 0.7–4.0)
MCHC: 33.4 g/dL (ref 30.0–36.0)
MCV: 78.9 fl (ref 78.0–100.0)
Monocytes Absolute: 0.6 10*3/uL (ref 0.1–1.0)
Monocytes Relative: 10.1 % (ref 3.0–12.0)
Neutro Abs: 3.8 10*3/uL (ref 1.4–7.7)
Neutrophils Relative %: 61.2 % (ref 43.0–77.0)
Platelets: 197 10*3/uL (ref 150.0–400.0)
RBC: 4.76 Mil/uL (ref 4.22–5.81)
RDW: 17.6 % — ABNORMAL HIGH (ref 11.5–15.5)
WBC: 6.2 10*3/uL (ref 4.0–10.5)

## 2019-09-04 LAB — PSA: PSA: 4.64 ng/mL — ABNORMAL HIGH (ref 0.10–4.00)

## 2019-09-04 LAB — LIPID PANEL
Cholesterol: 170 mg/dL (ref 0–200)
HDL: 39.1 mg/dL (ref >39.00)
Total CHOL/HDL Ratio: 4
Triglycerides: 433 mg/dL — ABNORMAL HIGH (ref 0.0–149.0)

## 2019-09-04 LAB — LDL CHOLESTEROL, DIRECT: Direct LDL: 42 mg/dL

## 2019-09-04 MED ORDER — DIAZEPAM 10 MG PO TABS: ORAL_TABLET | ORAL | 3 refills | Status: DC

## 2019-09-04 NOTE — Assessment & Plan Note (Signed)
Vaccinated for COVID x2

## 2019-09-04 NOTE — Progress Notes (Signed)
Subjective:  Patient ID: [name], male    DOB: 06-Jul-1950  Age: 69 y.o. MRN: [id number]  CC: No chief complaint on file.   HPI [name] presents for COPD, anxiety, dyslipidemia f/u  Outpatient Medications Prior to Visit  Medication Sig Dispense Refill  . aspirin 81 MG tablet Take 81 mg by mouth daily.      . cholecalciferol (VITAMIN D) 1000 UNITS tablet Take 2,000 Units by mouth daily.     . diazepam (VALIUM) 10 MG tablet TAKE 1/2 TO 1 (ONE-HALF TO ONE) TABLET BY MOUTH EVERY 12 HOURS AS NEEDED FOR ANXIETY 60 tablet 3  . ezetimibe (ZETIA) 10 MG tablet Take 1 tablet (10 mg total) by mouth daily. 90 tablet 3  . levothyroxine (SYNTHROID) 75 MCG tablet Take 1 tablet (75 mcg total) by mouth daily. 90 tablet 3  . losartan (COZAAR) 100 MG tablet Take 1 tablet (100 mg total) by mouth daily. Yearly physical due in March must see MD for refills 90 tablet 3  . nitroGLYCERIN (NITROSTAT) 0.4 MG SL tablet DISSOLVE ONE TABLET UNDER THE TONGUE EVERY 5 MINUTES AS NEEDED FOR CHEST PAIN.  DO NOT EXCEED A TOTAL OF 3 DOSES IN 15 MINUTES 25 tablet 0  . pantoprazole (PROTONIX) 40 MG tablet Take 1 tablet (40 mg total) by mouth daily. 90 tablet 3  . pravastatin (PRAVACHOL) 20 MG tablet Take 1 tablet (20 mg total) by mouth daily. 90 tablet 3   No facility-administered medications prior to visit.    ROS: Review of Systems  Constitutional: Negative for appetite change, fatigue and unexpected weight change.  HENT: Negative for congestion, nosebleeds, sneezing, sore throat and trouble swallowing.   Eyes: Negative for itching and visual disturbance.  Respiratory: Negative for cough.   Cardiovascular: Negative for chest pain, palpitations and leg swelling.  Gastrointestinal: Negative for abdominal distention, blood in stool, diarrhea and nausea.  Genitourinary: Negative for frequency and hematuria.  Musculoskeletal: Positive for arthralgias and back pain. Negative for gait problem, joint swelling  and neck pain.  Skin: Negative for rash.  Neurological: Negative for dizziness, tremors, speech difficulty and weakness.  Psychiatric/Behavioral: Negative for agitation, dysphoric mood and sleep disturbance. The patient is nervous/anxious.     Objective:  BP 118/66 (BP Location: Left Arm, Patient Position: Sitting, Cuff Size: Large)   Pulse 65   Temp 98.4 F (36.9 C) (Oral)   Ht 5' 8.25" (1.734 m)   Wt 197 lb (89.4 kg)   SpO2 94%   BMI 29.73 kg/m   BP Readings from Last 3 Encounters:  09/04/19 118/66  06/05/19 126/70  02/22/19 140/72    Wt Readings from Last 3 Encounters:  09/04/19 197 lb (89.4 kg)  06/05/19 200 lb (90.7 kg)  02/22/19 195 lb (88.5 kg)    Physical Exam Constitutional:      General: He is not in acute distress.    Appearance: He is well-developed.     Comments: NAD  Eyes:     Conjunctiva/sclera: Conjunctivae normal.     Pupils: Pupils are equal, round, and reactive to light.  Neck:     Thyroid: No thyromegaly.     Vascular: No JVD.  Cardiovascular:     Rate and Rhythm: Normal rate and regular rhythm.     Heart sounds: Normal heart sounds. No murmur. No friction rub. No gallop.   Pulmonary:     Effort: Pulmonary effort is normal. No respiratory distress.     Breath sounds:  Normal breath sounds. No wheezing or rales.  Chest:     Chest wall: No tenderness.  Abdominal:     General: Bowel sounds are normal. There is no distension.     Palpations: Abdomen is soft. There is no mass.     Tenderness: There is no abdominal tenderness. There is no guarding or rebound.  Musculoskeletal:        General: Tenderness present. Normal range of motion.     Cervical back: Normal range of motion.  Lymphadenopathy:     Cervical: No cervical adenopathy.  Skin:    General: Skin is warm and dry.     Findings: No rash.  Neurological:     Mental Status: He is alert and oriented to person, place, and time.     Cranial Nerves: No cranial nerve deficit.     Motor: No  abnormal muscle tone.     Coordination: Coordination normal.     Gait: Gait normal.     Deep Tendon Reflexes: Reflexes are normal and symmetric.  Psychiatric:        Behavior: Behavior normal.        Thought Content: Thought content normal.        Judgment: Judgment normal.     Lab Results  Component Value Date   WBC 7.7 05/23/2018   HGB 12.9 (L) 05/23/2018   HCT 38.1 (L) 05/23/2018   PLT 209.0 05/23/2018   GLUCOSE 89 02/22/2019   CHOL 177 08/28/2018   TRIG 476.0 (H) 08/28/2018   HDL 43.50 08/28/2018   LDLDIRECT 44.0 08/28/2018   LDLCALC 59 03/01/2012   ALT 41 08/28/2018   AST 37 08/28/2018   NA 137 02/22/2019   K 3.8 02/22/2019   CL 104 02/22/2019   CREATININE 0.92 02/22/2019   BUN 13 02/22/2019   CO2 25 02/22/2019   TSH 4.35 08/28/2018   PSA 1.86 05/23/2018    DG Abd 2 Views  Result Date: 04/12/2018 CLINICAL DATA:  Abdominal pain and distention EXAM: ABDOMEN - 2 VIEW COMPARISON:  CT abdomen 04/04/2018 FINDINGS: Normal bowel gas pattern.  No free air. Lumbar scoliosis, mild.  Mild arterial calcification. IMPRESSION: Normal bowel gas pattern Electronically Signed   By: Franchot Gallo M.D.   On: 04/12/2018 08:19    Assessment & Plan:      Follow-up: No follow-ups on file.  Walker Kehr, MD

## 2019-09-04 NOTE — Assessment & Plan Note (Signed)
Protonix.  ?

## 2019-09-04 NOTE — Assessment & Plan Note (Signed)
Statin intolerant Zetia

## 2019-09-04 NOTE — Assessment & Plan Note (Signed)
Stopped 2020

## 2019-09-04 NOTE — Assessment & Plan Note (Signed)
Vit D 

## 2019-09-04 NOTE — Assessment & Plan Note (Signed)
New. Repeat PSA in 3 months

## 2019-09-04 NOTE — Assessment & Plan Note (Signed)
No angina 

## 2019-09-04 NOTE — Assessment & Plan Note (Signed)
Zetia

## 2019-09-04 NOTE — Addendum Note (Signed)
Addended by: Cassandria Anger on: 09/04/2019 11:06 PM   Modules accepted: Orders

## 2019-09-04 NOTE — Assessment & Plan Note (Signed)
TSH 

## 2019-09-04 NOTE — Assessment & Plan Note (Signed)
Diazepam prn Chronic  Potential benefits of a long term benzodiazepines  use as well as potential risks  and complications were explained to the patient and were aknowledged.

## 2019-10-10 DIAGNOSIS — R69 Illness, unspecified: Secondary | ICD-10-CM | POA: Diagnosis not present

## 2019-10-11 IMAGING — DX DG CHEST 2V
2 series · 2 of 2 positions shown · non-contrast
Comparison: PA and lateral chest x-ray February 06, 2014

CLINICAL DATA: Intermittent nonproductive cough and shortness of
breath for the past 2 months. History of chronic bronchitis,
previous MI, former smoker.

EXAM:
CHEST  2 VIEW

[chest pa]
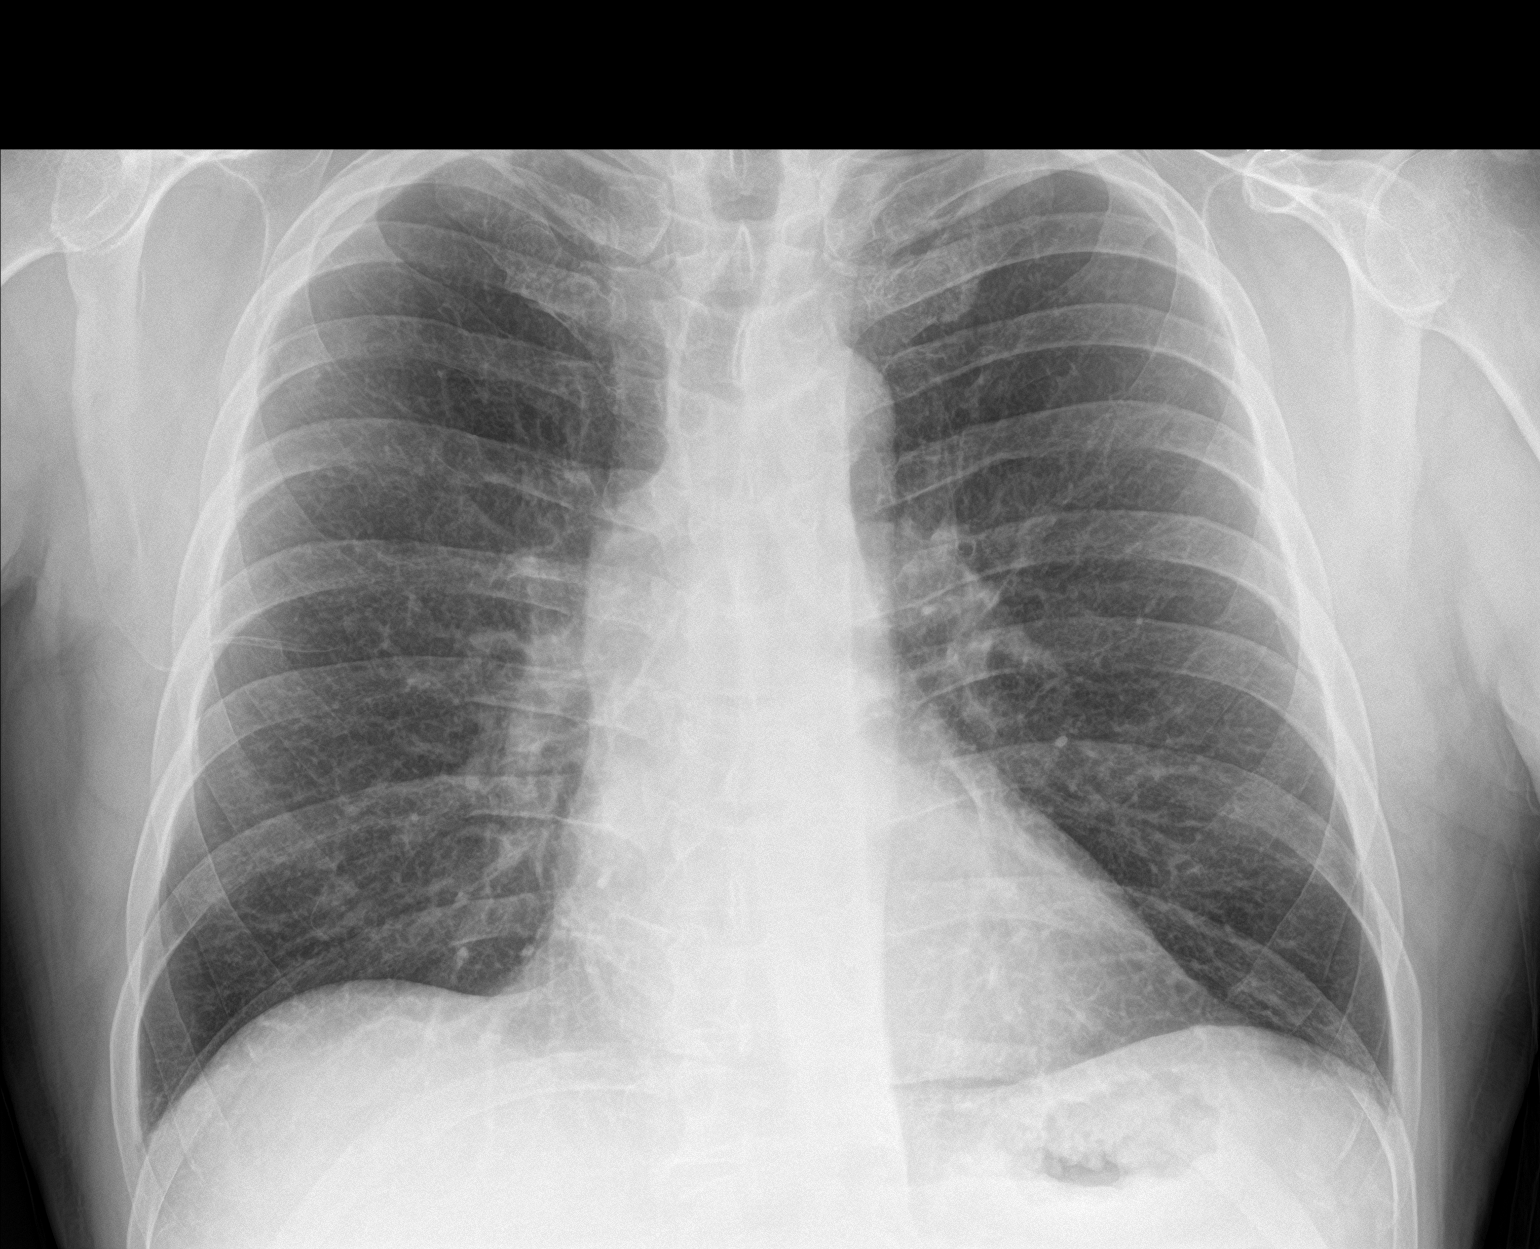

[chest lat]
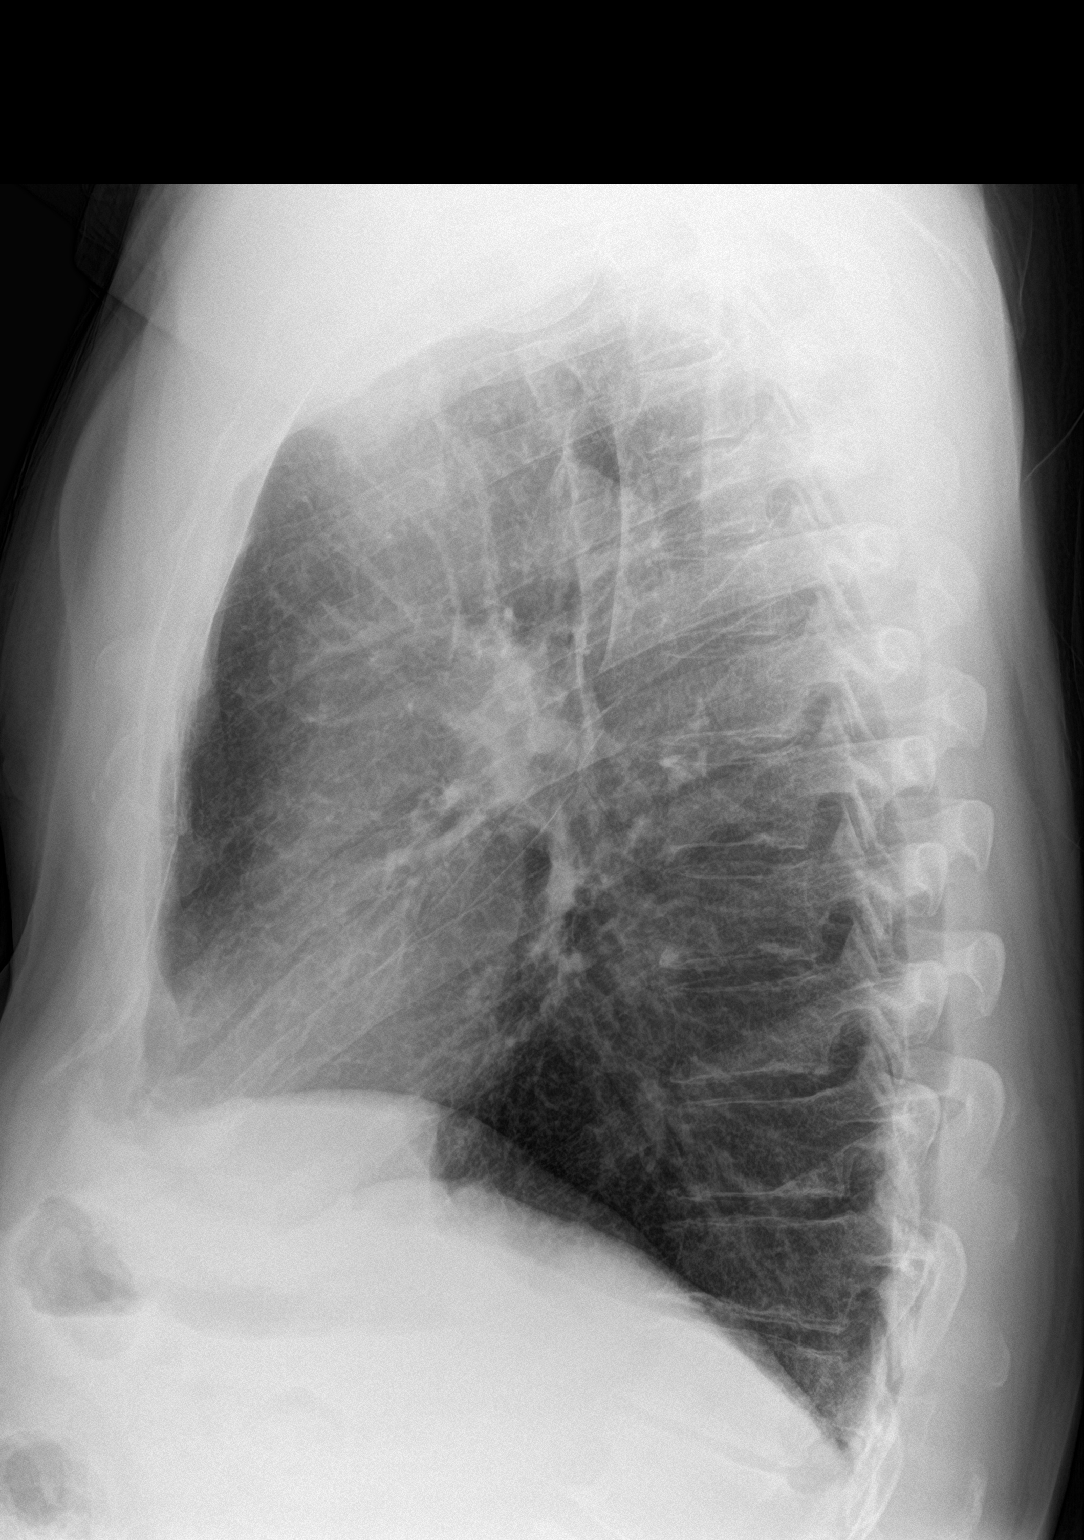

[2 of 2 positions shown; findings below may reference images not displayed]

FINDINGS: The lungs are well-expanded. There is no focal infiltrate. The
interstitial markings are coarse though stable. The heart and
pulmonary vascularity are normal. The mediastinum is normal in
width. There is faint calcification in the wall of the aortic arch.
There is no pleural effusion. The bony thorax exhibits no acute
abnormality.
IMPRESSION: COPD-smoking related changes. There is no pneumonia nor other acute
cardiopulmonary abnormality.

Thoracic aortic atherosclerosis.

## 2019-12-05 ENCOUNTER — Other Ambulatory Visit: Payer: Self-pay

## 2019-12-05 ENCOUNTER — Ambulatory Visit (INDEPENDENT_AMBULATORY_CARE_PROVIDER_SITE_OTHER): Payer: Medicare HMO | Admitting: Internal Medicine

## 2019-12-05 ENCOUNTER — Encounter: Payer: Self-pay | Admitting: Internal Medicine

## 2019-12-05 DIAGNOSIS — F411 Generalized anxiety disorder: Secondary | ICD-10-CM

## 2019-12-05 DIAGNOSIS — I2510 Atherosclerotic heart disease of native coronary artery without angina pectoris: Secondary | ICD-10-CM

## 2019-12-05 DIAGNOSIS — J449 Chronic obstructive pulmonary disease, unspecified: Secondary | ICD-10-CM | POA: Diagnosis not present

## 2019-12-05 DIAGNOSIS — E559 Vitamin D deficiency, unspecified: Secondary | ICD-10-CM | POA: Diagnosis not present

## 2019-12-05 DIAGNOSIS — R69 Illness, unspecified: Secondary | ICD-10-CM | POA: Diagnosis not present

## 2019-12-05 MED ORDER — DIAZEPAM 10 MG PO TABS: ORAL_TABLET | ORAL | 3 refills | Status: DC

## 2019-12-05 MED ORDER — NITROGLYCERIN 0.4 MG SL SUBL: SUBLINGUAL_TABLET | SUBLINGUAL | 2 refills | Status: DC

## 2019-12-05 NOTE — Assessment & Plan Note (Signed)
Chronic  Potential benefits of a long term benzodiazepines  use as well as potential risks  and complications were explained to the patient and were aknowledged. Not using Rx w/alcohol

## 2019-12-05 NOTE — Assessment & Plan Note (Signed)
Vit D 

## 2019-12-05 NOTE — Progress Notes (Signed)
Subjective:  Patient ID: [name], male    DOB: August 25, 1950  Age: 69 y.o. MRN: [id number]  CC: No chief complaint on file.   HPI [name] presents for anxiety, CAD, HTN BP was ok at home  Outpatient Medications Prior to Visit  Medication Sig Dispense Refill  . aspirin 81 MG tablet Take 81 mg by mouth daily.      . cholecalciferol (VITAMIN D) 1000 UNITS tablet Take 2,000 Units by mouth daily.     [name] ezetimibe (ZETIA) 10 MG tablet Take 1 tablet (10 mg total) by mouth daily. 90 tablet 3  . levothyroxine (SYNTHROID) 75 MCG tablet Take 1 tablet (75 mcg total) by mouth daily. 90 tablet 3  . losartan (COZAAR) 100 MG tablet Take 1 tablet (100 mg total) by mouth daily. Yearly physical due in March must see MD for refills 90 tablet 3  . pantoprazole (PROTONIX) 40 MG tablet Take 1 tablet (40 mg total) by mouth daily. 90 tablet 3  . pravastatin (PRAVACHOL) 20 MG tablet Take 1 tablet (20 mg total) by mouth daily. 90 tablet 3  . diazepam (VALIUM) 10 MG tablet TAKE 1/2 TO 1 (ONE-HALF TO ONE) TABLET BY MOUTH EVERY 12 HOURS AS NEEDED FOR ANXIETY 60 tablet 3  . nitroGLYCERIN (NITROSTAT) 0.4 MG SL tablet DISSOLVE ONE TABLET UNDER THE TONGUE EVERY 5 MINUTES AS NEEDED FOR CHEST PAIN.  DO NOT EXCEED A TOTAL OF 3 DOSES IN 15 MINUTES 25 tablet 0   No facility-administered medications prior to visit.    ROS: Review of Systems  Constitutional: Negative for appetite change, fatigue and unexpected weight change.  HENT: Negative for congestion, nosebleeds, sneezing, sore throat and trouble swallowing.   Eyes: Negative for itching and visual disturbance.  Respiratory: Negative for cough.   Cardiovascular: Negative for chest pain, palpitations and leg swelling.  Gastrointestinal: Negative for abdominal distention, blood in stool, diarrhea and nausea.  Genitourinary: Negative for frequency and hematuria.  Musculoskeletal: Negative for back pain, gait problem, joint swelling and neck pain.  Skin:  Negative for rash.  Neurological: Negative for dizziness, tremors, speech difficulty and weakness.  Psychiatric/Behavioral: Negative for agitation, dysphoric mood and sleep disturbance. The patient is nervous/anxious.     Objective:  BP (!) 160/90 (BP Location: Left Arm, Patient Position: Sitting, Cuff Size: Normal)   Pulse 67   Temp 98.5 F (36.9 C) (Oral)   Ht 5' 8.25" (1.734 m)   Wt 200 lb (90.7 kg)   SpO2 95%   BMI 30.19 kg/m   BP Readings from Last 3 Encounters:  12/05/19 (!) 160/90  09/04/19 118/66  06/05/19 126/70    Wt Readings from Last 3 Encounters:  12/05/19 200 lb (90.7 kg)  09/04/19 197 lb (89.4 kg)  06/05/19 200 lb (90.7 kg)    Physical Exam Constitutional:      General: He is not in acute distress.    Appearance: He is well-developed.     Comments: NAD  Eyes:     Conjunctiva/sclera: Conjunctivae normal.     Pupils: Pupils are equal, round, and reactive to light.  Neck:     Thyroid: No thyromegaly.     Vascular: No JVD.  Cardiovascular:     Rate and Rhythm: Normal rate and regular rhythm.     Heart sounds: Normal heart sounds. No murmur. No friction rub. No gallop.   Pulmonary:     Effort: Pulmonary effort is normal. No respiratory distress.  Breath sounds: Normal breath sounds. No wheezing or rales.  Chest:     Chest wall: No tenderness.  Abdominal:     General: Bowel sounds are normal. There is no distension.     Palpations: Abdomen is soft. There is no mass.     Tenderness: There is no abdominal tenderness. There is no guarding or rebound.  Musculoskeletal:        General: No tenderness. Normal range of motion.     Cervical back: Normal range of motion.  Lymphadenopathy:     Cervical: No cervical adenopathy.  Skin:    General: Skin is warm and dry.     Findings: No rash.  Neurological:     Mental Status: He is alert and oriented to person, place, and time.     Cranial Nerves: No cranial nerve deficit.     Motor: No abnormal muscle  tone.     Coordination: Coordination normal.     Gait: Gait normal.     Deep Tendon Reflexes: Reflexes are normal and symmetric.  Psychiatric:        Behavior: Behavior normal.        Thought Content: Thought content normal.        Judgment: Judgment normal.     Lab Results  Component Value Date   WBC 6.2 09/04/2019   HGB 12.5 (L) 09/04/2019   HCT 37.6 (L) 09/04/2019   PLT 197.0 09/04/2019   GLUCOSE 100 (H) 09/04/2019   CHOL 170 09/04/2019   TRIG (H) 09/04/2019    433.0 Triglyceride is over 400; calculations on Lipids are invalid.   HDL 39.10 09/04/2019   LDLDIRECT 42.0 09/04/2019   LDLCALC 59 03/01/2012   ALT 43 09/04/2019   AST 34 09/04/2019   NA 136 09/04/2019   K 3.7 09/04/2019   CL 100 09/04/2019   CREATININE 1.03 09/04/2019   BUN 16 09/04/2019   CO2 27 09/04/2019   TSH 3.33 09/04/2019   PSA 4.64 (H) 09/04/2019    DG Abd 2 Views  Result Date: 04/12/2018 CLINICAL DATA:  Abdominal pain and distention EXAM: ABDOMEN - 2 VIEW COMPARISON:  CT abdomen 04/04/2018 FINDINGS: Normal bowel gas pattern.  No free air. Lumbar scoliosis, mild.  Mild arterial calcification. IMPRESSION: Normal bowel gas pattern Electronically Signed   By: Franchot Gallo M.D.   On: 04/12/2018 08:19    Assessment & Plan:   There are no diagnoses linked to this encounter.   Meds ordered this encounter  Medications  . diazepam (VALIUM) 10 MG tablet    Sig: TAKE 1/2 TO 1 (ONE-HALF TO ONE) TABLET BY MOUTH EVERY 12 HOURS AS NEEDED FOR ANXIETY    Dispense:  60 tablet    Refill:  3  . nitroGLYCERIN (NITROSTAT) 0.4 MG SL tablet    Sig: DISSOLVE ONE TABLET UNDER THE TONGUE EVERY 5 MINUTES AS NEEDED FOR CHEST PAIN.  DO NOT EXCEED A TOTAL OF 3 DOSES IN 15 MINUTES    Dispense:  25 tablet    Refill:  2     Follow-up: No follow-ups on file.  Walker Kehr, MD

## 2019-12-05 NOTE — Assessment & Plan Note (Signed)
Anoro - using it inconsistently

## 2019-12-05 NOTE — Assessment & Plan Note (Signed)
No CP NTG Rx given

## 2019-12-11 DIAGNOSIS — Z0100 Encounter for examination of eyes and vision without abnormal findings: Secondary | ICD-10-CM | POA: Diagnosis not present

## 2019-12-11 DIAGNOSIS — H524 Presbyopia: Secondary | ICD-10-CM | POA: Diagnosis not present

## 2020-01-23 ENCOUNTER — Other Ambulatory Visit: Payer: Self-pay | Admitting: Internal Medicine

## 2020-01-24 NOTE — Telephone Encounter (Signed)
[place] Controlled Database Checked Last filled: 12/25/2019 (60) LOV w/you: 12/05/2019 Next appt w/you: 03/11/2020

## 2020-03-03 ENCOUNTER — Ambulatory Visit (INDEPENDENT_AMBULATORY_CARE_PROVIDER_SITE_OTHER): Payer: Medicare HMO

## 2020-03-03 DIAGNOSIS — Z0000 Encounter for general adult medical examination without abnormal findings: Secondary | ICD-10-CM

## 2020-03-03 NOTE — Patient Instructions (Addendum)
[name] , Thank you for taking time to come for your Medicare Wellness Visit. I appreciate your ongoing commitment to your health goals. Please review the following plan we discussed and let me know if I can assist you in the future.   Screening recommendations/referrals: Colonoscopy: 04/04/2018; due every 5 years; patient declined  Recommended yearly ophthalmology/optometry visit for glaucoma screening and checkup Recommended yearly dental visit for hygiene and checkup  Vaccinations: Influenza vaccine: 02/22/2019 Pneumococcal vaccine: completed Tdap vaccine: 06/01/2010; due every 10 years Shingles vaccine: completed   Covid-19: completed  Advanced directives: Advance directive discussed with you today. Even though you declined this today please call our office should you change your mind and we can give you the proper paperwork for you to fill out.  Conditions/risks identified: Yes. Reviewed health maintenance screenings with patient today and relevant education, vaccines, and/or referrals were provided. Continue doing brain stimulating activities (puzzles, reading, adult coloring books, staying active) to keep memory sharp. Continue to eat heart healthy diet (full of fruits, vegetables, whole grains, lean protein, water--limit salt, fat, and sugar intake) and increase physical activity as tolerated.  Next appointment: Please schedule your next Medicare Wellness Visit with your Nurse Health Advisor in 1 year.   Preventive Care 23 Years and Older, Male Preventive care refers to lifestyle choices and visits with your health care provider that can promote health and wellness. What does preventive care include?  A yearly physical exam. This is also called an annual well check.  Dental exams once or twice a year.  Routine eye exams. Ask your health care provider how often you should have your eyes checked.  Personal lifestyle choices, including:  Daily care of your teeth and  gums.  Regular physical activity.  Eating a healthy diet.  Avoiding tobacco and drug use.  Limiting alcohol use.  Practicing safe sex.  Taking low doses of aspirin every day.  Taking vitamin and mineral supplements as recommended by your health care provider. What happens during an annual well check? The services and screenings done by your health care provider during your annual well check will depend on your age, overall health, lifestyle risk factors, and family history of disease. Counseling  Your health care provider may ask you questions about your:  Alcohol use.  Tobacco use.  Drug use.  Emotional well-being.  Home and relationship well-being.  Sexual activity.  Eating habits.  History of falls.  Memory and ability to understand (cognition).  Work and work Statistician. Screening  You may have the following tests or measurements:  Height, weight, and BMI.  Blood pressure.  Lipid and cholesterol levels. These may be checked every 5 years, or more frequently if you are over 37 years old.  Skin check.  Lung cancer screening. You may have this screening every year starting at age 11 if you have a 30-pack-year history of smoking and currently smoke or have quit within the past 15 years.  Fecal occult blood test (FOBT) of the stool. You may have this test every year starting at age 34.  Flexible sigmoidoscopy or colonoscopy. You may have a sigmoidoscopy every 5 years or a colonoscopy every 10 years starting at age 89.  Prostate cancer screening. Recommendations will vary depending on your family history and other risks.  Hepatitis C blood test.  Hepatitis B blood test.  Sexually transmitted disease (STD) testing.  Diabetes screening. This is done by checking your blood sugar (glucose) after you have not eaten for a while (fasting).  You may have this done every 1-3 years.  Abdominal aortic aneurysm (AAA) screening. You may need this if you are a  current or former smoker.  Osteoporosis. You may be screened starting at age 28 if you are at high risk. Talk with your health care provider about your test results, treatment options, and if necessary, the need for more tests. Vaccines  Your health care provider may recommend certain vaccines, such as:  Influenza vaccine. This is recommended every year.  Tetanus, diphtheria, and acellular pertussis (Tdap, Td) vaccine. You may need a Td booster every 10 years.  Zoster vaccine. You may need this after age 10.  Pneumococcal 13-valent conjugate (PCV13) vaccine. One dose is recommended after age 41.  Pneumococcal polysaccharide (PPSV23) vaccine. One dose is recommended after age 25. Talk to your health care provider about which screenings and vaccines you need and how often you need them. This information is not intended to replace advice given to you by your health care provider. Make sure you discuss any questions you have with your health care provider. Document Released: 07/18/2015 Document Revised: 03/10/2016 Document Reviewed: 04/22/2015 Elsevier Interactive Patient Education  2017 [place] Prevention in the Home Falls can cause injuries. They can happen to people of all ages. There are many things you can do to make your home safe and to help prevent falls. What can I do on the outside of my home?  Regularly fix the edges of walkways and driveways and fix any cracks.  Remove anything that might make you trip as you walk through a door, such as a raised step or threshold.  Trim any bushes or trees on the path to your home.  Use bright outdoor lighting.  Clear any walking paths of anything that might make someone trip, such as rocks or tools.  Regularly check to see if handrails are loose or broken. Make sure that both sides of any steps have handrails.  Any raised decks and porches should have guardrails on the edges.  Have any leaves, snow, or ice cleared  regularly.  Use sand or salt on walking paths during winter.  Clean up any spills in your garage right away. This includes oil or grease spills. What can I do in the bathroom?  Use night lights.  Install grab bars by the toilet and in the tub and shower. Do not use towel bars as grab bars.  Use non-skid mats or decals in the tub or shower.  If you need to sit down in the shower, use a plastic, non-slip stool.  Keep the floor dry. Clean up any water that spills on the floor as soon as it happens.  Remove soap buildup in the tub or shower regularly.  Attach bath mats securely with double-sided non-slip rug tape.  Do not have throw rugs and other things on the floor that can make you trip. What can I do in the bedroom?  Use night lights.  Make sure that you have a light by your bed that is easy to reach.  Do not use any sheets or blankets that are too big for your bed. They should not hang down onto the floor.  Have a firm chair that has side arms. You can use this for support while you get dressed.  Do not have throw rugs and other things on the floor that can make you trip. What can I do in the kitchen?  Clean up any spills right away.  Avoid walking  on wet floors.  Keep items that you use a lot in easy-to-reach places.  If you need to reach something above you, use a strong step stool that has a grab bar.  Keep electrical cords out of the way.  Do not use floor polish or wax that makes floors slippery. If you must use wax, use non-skid floor wax.  Do not have throw rugs and other things on the floor that can make you trip. What can I do with my stairs?  Do not leave any items on the stairs.  Make sure that there are handrails on both sides of the stairs and use them. Fix handrails that are broken or loose. Make sure that handrails are as long as the stairways.  Check any carpeting to make sure that it is firmly attached to the stairs. Fix any carpet that is loose  or worn.  Avoid having throw rugs at the top or bottom of the stairs. If you do have throw rugs, attach them to the floor with carpet tape.  Make sure that you have a light switch at the top of the stairs and the bottom of the stairs. If you do not have them, ask someone to add them for you. What else can I do to help prevent falls?  Wear shoes that:  Do not have high heels.  Have rubber bottoms.  Are comfortable and fit you well.  Are closed at the toe. Do not wear sandals.  If you use a stepladder:  Make sure that it is fully opened. Do not climb a closed stepladder.  Make sure that both sides of the stepladder are locked into place.  Ask someone to hold it for you, if possible.  Clearly mark and make sure that you can see:  Any grab bars or handrails.  First and last steps.  Where the edge of each step is.  Use tools that help you move around (mobility aids) if they are needed. These include:  Canes.  Walkers.  Scooters.  Crutches.  Turn on the lights when you go into a dark area. Replace any light bulbs as soon as they burn out.  Set up your furniture so you have a clear path. Avoid moving your furniture around.  If any of your floors are uneven, fix them.  If there are any pets around you, be aware of where they are.  Review your medicines with your doctor. Some medicines can make you feel dizzy. This can increase your chance of falling. Ask your doctor what other things that you can do to help prevent falls. This information is not intended to replace advice given to you by your health care provider. Make sure you discuss any questions you have with your health care provider. Document Released: 04/17/2009 Document Revised: 11/27/2015 Document Reviewed: 07/26/2014 Elsevier Interactive Patient Education  2017 [employer].

## 2020-03-03 NOTE — Progress Notes (Signed)
Subjective:   [name] is a 69 y.o. male who presents for Medicare Annual/Subsequent preventive examination.  Review of Systems    No ROS. Medicare Wellness Visit. Cardiac Risk Factors include: advanced age (>59men, >48 women);dyslipidemia;family history of premature cardiovascular disease;hypertension;male gender     Objective:    There were no vitals filed for this visit. There is no height or weight on file to calculate BMI.  Advanced Directives 03/03/2020 11/30/2017 11/30/2017 11/08/2017  Does Patient Have a Medical Advance Directive? No No No No  Would patient like information on creating a medical advance directive? No - Patient declined No - Patient declined Yes (ED - Information included in AVS) -    Current Medications (verified) Outpatient Encounter Medications as of 03/03/2020  Medication Sig  . aspirin 81 MG tablet Take 81 mg by mouth daily.    . cholecalciferol (VITAMIN D) 1000 UNITS tablet Take 2,000 Units by mouth daily.   . diazepam (VALIUM) 10 MG tablet TAKE 1/2 TO 1 (ONE-HALF TO ONE) TABLET BY MOUTH EVERY 12 HOURS AS NEEDED FOR ANXIETY  . ezetimibe (ZETIA) 10 MG tablet Take 1 tablet (10 mg total) by mouth daily.  [name] levothyroxine (SYNTHROID) 75 MCG tablet Take 1 tablet (75 mcg total) by mouth daily.  [name] losartan (COZAAR) 100 MG tablet Take 1 tablet (100 mg total) by mouth daily. Yearly physical due in March must see MD for refills  . nitroGLYCERIN (NITROSTAT) 0.4 MG SL tablet DISSOLVE ONE TABLET UNDER THE TONGUE EVERY 5 MINUTES AS NEEDED FOR CHEST PAIN.  DO NOT EXCEED A TOTAL OF 3 DOSES IN 15 MINUTES  . pantoprazole (PROTONIX) 40 MG tablet Take 1 tablet (40 mg total) by mouth daily.  . pravastatin (PRAVACHOL) 20 MG tablet Take 1 tablet (20 mg total) by mouth daily.   No facility-administered encounter medications on file as of 03/03/2020.    Allergies (verified) Atorvastatin, Bee venom, Crestor [rosuvastatin calcium], and Statins   History: Past Medical  History:  Diagnosis Date  . Allergy   . Allergy to bee sting   . Anxiety   . CAD (coronary artery disease)    statin intolerant  . Cyst   . Depression   . Dermatitis due to sun   . ED (erectile dysfunction)   . Hyperlipidemia   . Hypertension   . MI (myocardial infarction) ([place]) 2002  . OA (osteoarthritis)   . Tobacco abuse   . Vitamin D deficiency 2008   Past Surgical History:  Procedure Laterality Date  . broken jaw     1970-80's   . cardiac catherization  07-14-01  . COLONOSCOPY  2013  . ESOPHAGOGASTRODUODENOSCOPY  10-06-01  . stress cardiolite  06-30-01  . UPPER GASTROINTESTINAL ENDOSCOPY     Family History  Problem Relation Age of Onset  . Cancer Brother        lymphoma  . Heart disease Father   . Colon cancer Neg Hx   . Esophageal cancer Neg Hx   . Rectal cancer Neg Hx   . Stomach cancer Neg Hx   . Colon polyps Neg Hx    Social History   Socioeconomic History  . Marital status: Divorced    Spouse name: Not on file  . Number of children: Not on file  . Years of education: Not on file  . Highest education level: Not on file  Occupational History  . Occupation: Retired    Comment: 12/2012  Tobacco Use  . Smoking status:  Former Smoker    Packs/day: 2.00    Types: Cigarettes    Quit date: 11/02/2012    Years since quitting: 7.3  . Smokeless tobacco: Never Used  Vaping Use  . Vaping Use: Never used  Substance and Sexual Activity  . Alcohol use: Yes    Alcohol/week: 12.0 standard drinks    Types: 12 Cans of beer per week  . Drug use: Yes    Types: Marijuana    Comment: marijuana  . Sexual activity: Yes  Other Topics Concern  . Not on file  Social History Narrative   Regular exercise-no   Social Determinants of Health   Financial Resource Strain: Low Risk   . Difficulty of Paying Living Expenses: Not hard at all  Food Insecurity: No Food Insecurity  . Worried About Charity fundraiser in the Last Year: Never true  . Ran Out of Food in the Last  Year: Never true  Transportation Needs: No Transportation Needs  . Lack of Transportation (Medical): No  . Lack of Transportation (Non-Medical): No  Physical Activity: Sufficiently Active  . Days of Exercise per Week: 5 days  . Minutes of Exercise per Session: 30 min  Stress: No Stress Concern Present  . Feeling of Stress : Not at all  Social Connections: Unknown  . Frequency of Communication with Friends and Family: More than three times a week  . Frequency of Social Gatherings with Friends and Family: Once a week  . Attends Religious Services: Patient refused  . Active Member of Clubs or Organizations: Patient refused  . Attends Archivist Meetings: Patient refused  . Marital Status: Living with partner    Tobacco Counseling Counseling given: Not Answered   Clinical Intake:  Pre-visit preparation completed: Yes  Pain : No/denies pain     Nutritional Risks: None Diabetes: No  How often do you need to have someone help you when you read instructions, pamphlets, or other written materials from your doctor or pharmacy?: 1 - Never What is the last grade level you completed in school?: 10th grade  Diabetic? no  Interpreter Needed?: No  Information entered by :: Tayelor Osborne N. Latisha Lasch, LPN   Activities of Daily Living In your present state of health, do you have any difficulty performing the following activities: 03/03/2020  Hearing? N  Vision? N  Difficulty concentrating or making decisions? N  Walking or climbing stairs? N  Dressing or bathing? N  Doing errands, shopping? N  Preparing Food and eating ? N  Using the Toilet? N  In the past six months, have you accidently leaked urine? N  Do you have problems with loss of bowel control? N  Managing your Medications? N  Managing your Finances? N  Housekeeping or managing your Housekeeping? N  Some recent data might be hidden    Patient Care Team: Plotnikov, Evie Lacks, MD as PCP - General  Indicate any  recent Medical Services you may have received from other than [facility] providers in the past year (date may be approximate).     Assessment:   This is a routine wellness examination for [name].  Hearing/Vision screen No exam data present  Dietary issues and exercise activities discussed: Current Exercise Habits: Home exercise routine, Type of exercise: Other - see comments (yard work), Time (Minutes): 30, Frequency (Times/Week): 5, Weekly Exercise (Minutes/Week): 150, Intensity: Moderate, Exercise limited by: None identified  Goals   None    Depression Screen [facility] 2/9 Scores 03/03/2020 03/03/2020 09/04/2019 08/24/2018 06/22/2017  06/21/2016  PHQ - 2 Score 0 0 0 0 0 1    Fall Risk Fall Risk  03/03/2020 09/04/2019 08/24/2018 06/22/2017 06/21/2016  Falls in the past year? 0 0 0 Yes No  Number falls in past yr: 0 - - 1 -  Injury with Fall? 0 - - No -  Risk for fall due to : No Fall Risks - - - -  Follow up Falls evaluation completed;Education provided Falls evaluation completed Falls evaluation completed - -    Any stairs in or around the home? Yes  If so, are there any without handrails? No  Home free of loose throw rugs in walkways, pet beds, electrical cords, etc? Yes  Adequate lighting in your home to reduce risk of falls? Yes   ASSISTIVE DEVICES UTILIZED TO PREVENT FALLS:  Life alert? No  Use of a cane, walker or w/c? No  Grab bars in the bathroom? No  Shower chair or bench in shower? No  Elevated toilet seat or a handicapped toilet? No   TIMED UP AND GO:  Was the test performed? No .  Length of time to ambulate 10 feet: 0 sec.   Gait steady and fast without use of assistive device  Cognitive Function:        Immunizations Immunization History  Administered Date(s) Administered  . Fluad Quad(high Dose 65+) 02/22/2019  . Influenza Whole 07/07/2009, 03/03/2010  . Influenza, Quadrivalent, Recombinant, Inj, Pf 02/21/2018  . Influenza,inj,Quad PF,6+ Mos 03/18/2015  .  Influenza-Unspecified 03/05/2016, 04/22/2017  . PFIZER SARS-COV-2 Vaccination 08/11/2019, 09/01/2019  . Pneumococcal Conjugate-13 08/08/2013, 09/05/2016  . Pneumococcal Polysaccharide-23 03/08/2012, 08/10/2017  . Td 06/01/2010  . Zoster Recombinat (Shingrix) 08/10/2017, 10/21/2017    TDAP status: Up to date Flu Vaccine status: Up to date Pneumococcal vaccine status: Up to date Covid-19 vaccine status: Completed vaccines  Qualifies for Shingles Vaccine? Yes   Zostavax completed Yes   Shingrix Completed?: Yes  Screening Tests Health Maintenance  Topic Date Due  . INFLUENZA VACCINE  02/03/2020  . TETANUS/TDAP  06/01/2020  . COLONOSCOPY  04/05/2023  . COVID-19 Vaccine  Completed  . Hepatitis C Screening  Completed  . PNA vac Low Risk Adult  Completed    Health Maintenance  Health Maintenance Due  Topic Date Due  . INFLUENZA VACCINE  02/03/2020    Colorectal cancer screening: Completed 04/04/2018. Repeat every 5 years  Lung Cancer Screening: (Low Dose CT Chest recommended if Age 24-80 years, 30 pack-year currently smoking OR have quit w/in 15years.) does not qualify.   Lung Cancer Screening Referral: no  Additional Screening:  Hepatitis C Screening: does qualify; Completed yes  Vision Screening: Recommended annual ophthalmology exams for early detection of glaucoma and other disorders of the eye. Is the patient up to date with their annual eye exam?  Yes  Who is the provider or what is the name of the office in which the patient attends annual eye exams? [facility] If pt is not established with a provider, would they like to be referred to a provider to establish care? No .   Dental Screening: Recommended annual dental exams for proper oral hygiene  Community Resource Referral / Chronic Care Management: CRR required this visit?  No   CCM required this visit?  No      Plan:     I have personally reviewed and noted the following in the patient's chart:    . Medical and social history . Use of alcohol, tobacco or  illicit drugs  . Current medications and supplements . Functional ability and status . Nutritional status . Physical activity . Advanced directives . List of other physicians . Hospitalizations, surgeries, and ER visits in previous 12 months . Vitals . Screenings to include cognitive, depression, and falls . Referrals and appointments  In addition, I have reviewed and discussed with patient certain preventive protocols, quality metrics, and best practice recommendations. A written personalized care plan for preventive services as well as general preventive health recommendations were provided to patient.     Sheral Flow, LPN   [id number]   Nurse Notes:  Patient is cogitatively intact. There were no vitals filed for this visit. There is no height or weight on file to calculate BMI. Patient stated that he has no issues with gait or balance; does not use any assistive devices. Patient will speak with primary care at next visit regarding Cologuard.

## 2020-03-06 DIAGNOSIS — R69 Illness, unspecified: Secondary | ICD-10-CM | POA: Diagnosis not present

## 2020-03-11 ENCOUNTER — Other Ambulatory Visit: Payer: Self-pay

## 2020-03-11 ENCOUNTER — Encounter: Payer: Self-pay | Admitting: Internal Medicine

## 2020-03-11 ENCOUNTER — Ambulatory Visit (INDEPENDENT_AMBULATORY_CARE_PROVIDER_SITE_OTHER): Payer: Medicare HMO | Admitting: Internal Medicine

## 2020-03-11 VITALS — BP 110/80 | HR 61 | Temp 97.9°F | Ht 68.25 in | Wt 193.0 lb

## 2020-03-11 DIAGNOSIS — F411 Generalized anxiety disorder: Secondary | ICD-10-CM

## 2020-03-11 DIAGNOSIS — R972 Elevated prostate specific antigen [PSA]: Secondary | ICD-10-CM | POA: Diagnosis not present

## 2020-03-11 DIAGNOSIS — I119 Hypertensive heart disease without heart failure: Secondary | ICD-10-CM | POA: Diagnosis not present

## 2020-03-11 DIAGNOSIS — J449 Chronic obstructive pulmonary disease, unspecified: Secondary | ICD-10-CM | POA: Diagnosis not present

## 2020-03-11 DIAGNOSIS — E559 Vitamin D deficiency, unspecified: Secondary | ICD-10-CM | POA: Diagnosis not present

## 2020-03-11 DIAGNOSIS — I2510 Atherosclerotic heart disease of native coronary artery without angina pectoris: Secondary | ICD-10-CM

## 2020-03-11 DIAGNOSIS — R911 Solitary pulmonary nodule: Secondary | ICD-10-CM | POA: Diagnosis not present

## 2020-03-11 DIAGNOSIS — R69 Illness, unspecified: Secondary | ICD-10-CM | POA: Diagnosis not present

## 2020-03-11 DIAGNOSIS — Z0000 Encounter for general adult medical examination without abnormal findings: Secondary | ICD-10-CM

## 2020-03-11 DIAGNOSIS — R918 Other nonspecific abnormal finding of lung field: Secondary | ICD-10-CM

## 2020-03-11 DIAGNOSIS — E039 Hypothyroidism, unspecified: Secondary | ICD-10-CM

## 2020-03-11 MED ORDER — DIAZEPAM 10 MG PO TABS: 5.0000 mg | ORAL_TABLET | Freq: Two times a day (BID) | ORAL | 2 refills | Status: DC | PRN

## 2020-03-11 NOTE — Assessment & Plan Note (Signed)
Vit D 

## 2020-03-11 NOTE — Assessment & Plan Note (Signed)
Labs

## 2020-03-11 NOTE — Assessment & Plan Note (Signed)
Repeat CT

## 2020-03-11 NOTE — Progress Notes (Signed)
Subjective:  Patient ID: [name], male    DOB: [birth year]  Age: 69 y.o. MRN: [id number]  CC: No chief complaint on file.   HPI [name] presents for anxiety, HTN, hypothyroidism f/u  Outpatient Medications Prior to Visit  Medication Sig Dispense Refill  . aspirin 81 MG tablet Take 81 mg by mouth daily.      . cholecalciferol (VITAMIN D) 1000 UNITS tablet Take 2,000 Units by mouth daily.     . diazepam (VALIUM) 10 MG tablet TAKE 1/2 TO 1 (ONE-HALF TO ONE) TABLET BY MOUTH EVERY 12 HOURS AS NEEDED FOR ANXIETY 60 tablet 1  . ezetimibe (ZETIA) 10 MG tablet Take 1 tablet (10 mg total) by mouth daily. 90 tablet 3  . levothyroxine (SYNTHROID) 75 MCG tablet Take 1 tablet (75 mcg total) by mouth daily. 90 tablet 3  . losartan (COZAAR) 100 MG tablet Take 1 tablet (100 mg total) by mouth daily. Yearly physical due in March must see MD for refills 90 tablet 3  . nitroGLYCERIN (NITROSTAT) 0.4 MG SL tablet DISSOLVE ONE TABLET UNDER THE TONGUE EVERY 5 MINUTES AS NEEDED FOR CHEST PAIN.  DO NOT EXCEED A TOTAL OF 3 DOSES IN 15 MINUTES 25 tablet 2  . pantoprazole (PROTONIX) 40 MG tablet Take 1 tablet (40 mg total) by mouth daily. 90 tablet 3  . pravastatin (PRAVACHOL) 20 MG tablet Take 1 tablet (20 mg total) by mouth daily. 90 tablet 3   No facility-administered medications prior to visit.    ROS: Review of Systems  Constitutional: Positive for unexpected weight change. Negative for appetite change and fatigue.  HENT: Negative for congestion, nosebleeds, sneezing, sore throat and trouble swallowing.   Eyes: Negative for itching and visual disturbance.  Respiratory: Negative for cough.   Cardiovascular: Negative for chest pain, palpitations and leg swelling.  Gastrointestinal: Negative for abdominal distention, blood in stool, diarrhea and nausea.  Genitourinary: Negative for frequency and hematuria.  Musculoskeletal: Positive for arthralgias. Negative for back pain, gait problem,  joint swelling and neck pain.  Skin: Negative for rash.  Neurological: Negative for dizziness, tremors, speech difficulty and weakness.  Psychiatric/Behavioral: Negative for agitation, dysphoric mood and sleep disturbance. The patient is nervous/anxious.     Objective:  BP 110/80 (BP Location: Right Arm, Patient Position: Sitting, Cuff Size: Normal)   Pulse 61   Temp 97.9 F (36.6 C) (Oral)   Ht 5' 8.25" (1.734 m)   Wt 193 lb (87.5 kg)   SpO2 96%   BMI 29.13 kg/m   BP Readings from Last 3 Encounters:  03/11/20 110/80  12/05/19 (!) 160/90  09/04/19 118/66    Wt Readings from Last 3 Encounters:  03/11/20 193 lb (87.5 kg)  12/05/19 200 lb (90.7 kg)  09/04/19 197 lb (89.4 kg)    Physical Exam Constitutional:      General: He is not in acute distress.    Appearance: He is well-developed.     Comments: NAD  Eyes:     Conjunctiva/sclera: Conjunctivae normal.     Pupils: Pupils are equal, round, and reactive to light.  Neck:     Thyroid: No thyromegaly.     Vascular: No JVD.  Cardiovascular:     Rate and Rhythm: Normal rate and regular rhythm.     Heart sounds: Normal heart sounds. No murmur heard.  No friction rub. No gallop.   Pulmonary:     Effort: Pulmonary effort is normal. No respiratory distress.  Breath sounds: Normal breath sounds. No wheezing or rales.  Chest:     Chest wall: No tenderness.  Abdominal:     General: Bowel sounds are normal. There is no distension.     Palpations: Abdomen is soft. There is no mass.     Tenderness: There is no abdominal tenderness. There is no guarding or rebound.  Musculoskeletal:        General: No tenderness. Normal range of motion.     Cervical back: Normal range of motion.  Lymphadenopathy:     Cervical: No cervical adenopathy.  Skin:    General: Skin is warm and dry.     Findings: No rash.  Neurological:     Mental Status: He is alert and oriented to person, place, and time.     Cranial Nerves: No cranial nerve  deficit.     Motor: No abnormal muscle tone.     Coordination: Coordination normal.     Gait: Gait normal.     Deep Tendon Reflexes: Reflexes are normal and symmetric.  Psychiatric:        Behavior: Behavior normal.        Thought Content: Thought content normal.        Judgment: Judgment normal.     Lab Results  Component Value Date   WBC 6.2 09/04/2019   HGB 12.5 (L) 09/04/2019   HCT 37.6 (L) 09/04/2019   PLT 197.0 09/04/2019   GLUCOSE 100 (H) 09/04/2019   CHOL 170 09/04/2019   TRIG (H) 09/04/2019    433.0 Triglyceride is over 400; calculations on Lipids are invalid.   HDL 39.10 09/04/2019   LDLDIRECT 42.0 09/04/2019   LDLCALC 59 03/01/2012   ALT 43 09/04/2019   AST 34 09/04/2019   NA 136 09/04/2019   K 3.7 09/04/2019   CL 100 09/04/2019   CREATININE 1.03 09/04/2019   BUN 16 09/04/2019   CO2 27 09/04/2019   TSH 3.33 09/04/2019   PSA 4.64 (H) 09/04/2019    DG Abd 2 Views  Result Date: 04/12/2018 CLINICAL DATA:  Abdominal pain and distention EXAM: ABDOMEN - 2 VIEW COMPARISON:  CT abdomen 04/04/2018 FINDINGS: Normal bowel gas pattern.  No free air. Lumbar scoliosis, mild.  Mild arterial calcification. IMPRESSION: Normal bowel gas pattern Electronically Signed   By: Franchot Gallo M.D.   On: 04/12/2018 08:19    Assessment & Plan:    Walker Kehr, MD

## 2020-03-11 NOTE — Assessment & Plan Note (Signed)
Losartan 

## 2020-03-11 NOTE — Assessment & Plan Note (Signed)
ASA, Pravastatin- d/c. Zetia Risks associated with treatment noncompliance were discussed. Compliance was encouraged.

## 2020-03-11 NOTE — Assessment & Plan Note (Signed)
Levothroid 

## 2020-03-11 NOTE — Assessment & Plan Note (Signed)
Diazepam prn Chronic  Potential benefits of a long term benzodiazepines  use as well as potential risks  and complications were explained to the patient and were aknowledged. Not using Rx w/alcohol

## 2020-03-11 NOTE — Assessment & Plan Note (Signed)
Quit smoking on 12/15/14

## 2020-03-13 ENCOUNTER — Other Ambulatory Visit: Payer: Self-pay | Admitting: Internal Medicine

## 2020-03-13 DIAGNOSIS — R918 Other nonspecific abnormal finding of lung field: Secondary | ICD-10-CM

## 2020-03-20 ENCOUNTER — Other Ambulatory Visit: Payer: Self-pay

## 2020-03-20 ENCOUNTER — Ambulatory Visit (INDEPENDENT_AMBULATORY_CARE_PROVIDER_SITE_OTHER)
Admission: RE | Admit: 2020-03-20 | Discharge: 2020-03-20 | Disposition: A | Discharge status: Home / Self Care | Payer: Medicare HMO | Source: Ambulatory Visit | Attending: Internal Medicine | Admitting: Internal Medicine

## 2020-03-20 DIAGNOSIS — J439 Emphysema, unspecified: Secondary | ICD-10-CM | POA: Diagnosis not present

## 2020-03-20 DIAGNOSIS — I700 Atherosclerosis of aorta: Secondary | ICD-10-CM | POA: Diagnosis not present

## 2020-03-20 DIAGNOSIS — R918 Other nonspecific abnormal finding of lung field: Secondary | ICD-10-CM | POA: Diagnosis not present

## 2020-03-20 DIAGNOSIS — I2510 Atherosclerotic heart disease of native coronary artery without angina pectoris: Secondary | ICD-10-CM | POA: Diagnosis not present

## 2020-03-22 DIAGNOSIS — Z20822 Contact with and (suspected) exposure to covid-19: Secondary | ICD-10-CM | POA: Diagnosis not present

## 2020-03-28 DIAGNOSIS — R69 Illness, unspecified: Secondary | ICD-10-CM | POA: Diagnosis not present

## 2020-04-17 ENCOUNTER — Other Ambulatory Visit: Payer: Self-pay | Admitting: Internal Medicine

## 2020-05-14 ENCOUNTER — Other Ambulatory Visit: Payer: Self-pay | Admitting: Internal Medicine

## 2020-05-21 DIAGNOSIS — E785 Hyperlipidemia, unspecified: Secondary | ICD-10-CM | POA: Diagnosis not present

## 2020-05-21 DIAGNOSIS — R69 Illness, unspecified: Secondary | ICD-10-CM | POA: Diagnosis not present

## 2020-05-21 DIAGNOSIS — I10 Essential (primary) hypertension: Secondary | ICD-10-CM | POA: Diagnosis not present

## 2020-05-21 DIAGNOSIS — G4700 Insomnia, unspecified: Secondary | ICD-10-CM | POA: Diagnosis not present

## 2020-05-21 DIAGNOSIS — E663 Overweight: Secondary | ICD-10-CM | POA: Diagnosis not present

## 2020-05-21 DIAGNOSIS — J449 Chronic obstructive pulmonary disease, unspecified: Secondary | ICD-10-CM | POA: Diagnosis not present

## 2020-05-21 DIAGNOSIS — G8929 Other chronic pain: Secondary | ICD-10-CM | POA: Diagnosis not present

## 2020-05-21 DIAGNOSIS — I25119 Atherosclerotic heart disease of native coronary artery with unspecified angina pectoris: Secondary | ICD-10-CM | POA: Diagnosis not present

## 2020-05-21 DIAGNOSIS — E039 Hypothyroidism, unspecified: Secondary | ICD-10-CM | POA: Diagnosis not present

## 2020-05-21 DIAGNOSIS — I252 Old myocardial infarction: Secondary | ICD-10-CM | POA: Diagnosis not present

## 2020-06-10 ENCOUNTER — Encounter: Payer: Medicare HMO | Admitting: Internal Medicine

## 2020-06-10 ENCOUNTER — Ambulatory Visit: Payer: Medicare HMO | Admitting: Internal Medicine

## 2020-06-10 ENCOUNTER — Other Ambulatory Visit: Payer: Self-pay

## 2020-06-11 ENCOUNTER — Ambulatory Visit (INDEPENDENT_AMBULATORY_CARE_PROVIDER_SITE_OTHER): Payer: Medicare HMO | Admitting: Internal Medicine

## 2020-06-11 ENCOUNTER — Encounter: Payer: Self-pay | Admitting: Internal Medicine

## 2020-06-11 VITALS — BP 112/72 | HR 71 | Temp 98.4°F | Ht 68.25 in | Wt 194.0 lb

## 2020-06-11 DIAGNOSIS — R918 Other nonspecific abnormal finding of lung field: Secondary | ICD-10-CM | POA: Diagnosis not present

## 2020-06-11 DIAGNOSIS — H02403 Unspecified ptosis of bilateral eyelids: Secondary | ICD-10-CM | POA: Diagnosis not present

## 2020-06-11 DIAGNOSIS — R972 Elevated prostate specific antigen [PSA]: Secondary | ICD-10-CM | POA: Diagnosis not present

## 2020-06-11 DIAGNOSIS — Z0000 Encounter for general adult medical examination without abnormal findings: Secondary | ICD-10-CM

## 2020-06-11 DIAGNOSIS — I2510 Atherosclerotic heart disease of native coronary artery without angina pectoris: Secondary | ICD-10-CM | POA: Diagnosis not present

## 2020-06-11 DIAGNOSIS — H6121 Impacted cerumen, right ear: Secondary | ICD-10-CM | POA: Diagnosis not present

## 2020-06-11 DIAGNOSIS — E782 Mixed hyperlipidemia: Secondary | ICD-10-CM | POA: Diagnosis not present

## 2020-06-11 DIAGNOSIS — H02409 Unspecified ptosis of unspecified eyelid: Secondary | ICD-10-CM | POA: Insufficient documentation

## 2020-06-11 LAB — CBC WITH DIFFERENTIAL/PLATELET
Basophils Absolute: 0.1 10*3/uL (ref 0.0–0.1)
Basophils Relative: 1.1 % (ref 0.0–3.0)
Eosinophils Absolute: 0.1 10*3/uL (ref 0.0–0.7)
Eosinophils Relative: 1.2 % (ref 0.0–5.0)
HCT: 39.7 % (ref 39.0–52.0)
Hemoglobin: 13.2 g/dL (ref 13.0–17.0)
Lymphocytes Relative: 23.9 % (ref 12.0–46.0)
Lymphs Abs: 1.3 10*3/uL (ref 0.7–4.0)
MCHC: 33.2 g/dL (ref 30.0–36.0)
MCV: 79.1 fl (ref 78.0–100.0)
Monocytes Absolute: 0.5 10*3/uL (ref 0.1–1.0)
Monocytes Relative: 8.4 % (ref 3.0–12.0)
Neutro Abs: 3.7 10*3/uL (ref 1.4–7.7)
Neutrophils Relative %: 65.4 % (ref 43.0–77.0)
Platelets: 195 10*3/uL (ref 150.0–400.0)
RBC: 5.02 Mil/uL (ref 4.22–5.81)
RDW: 18.2 % — ABNORMAL HIGH (ref 11.5–15.5)
WBC: 5.6 10*3/uL (ref 4.0–10.5)

## 2020-06-11 LAB — LDL CHOLESTEROL, DIRECT: Direct LDL: 45 mg/dL

## 2020-06-11 LAB — COMPREHENSIVE METABOLIC PANEL
ALT: 41 U/L (ref 0–53)
AST: 38 U/L — ABNORMAL HIGH (ref 0–37)
Albumin: 4.4 g/dL (ref 3.5–5.2)
Alkaline Phosphatase: 43 U/L (ref 39–117)
BUN: 15 mg/dL (ref 6–23)
CO2: 27 mEq/L (ref 19–32)
Calcium: 9.3 mg/dL (ref 8.4–10.5)
Chloride: 101 mEq/L (ref 96–112)
Creatinine, Ser: 1.03 mg/dL (ref 0.40–1.50)
GFR: 74.22 mL/min (ref >60.00)
Glucose, Bld: 92 mg/dL (ref 70–99)
Potassium: 3.8 mEq/L (ref 3.5–5.1)
Sodium: 136 mEq/L (ref 135–145)
Total Bilirubin: 0.6 mg/dL (ref 0.2–1.2)
Total Protein: 8.5 g/dL — ABNORMAL HIGH (ref 6.0–8.3)

## 2020-06-11 LAB — URINALYSIS
Bilirubin Urine: NEGATIVE
Hgb urine dipstick: NEGATIVE
Ketones, ur: NEGATIVE
Leukocytes,Ua: NEGATIVE
Nitrite: NEGATIVE
Specific Gravity, Urine: 1.02 (ref 1.000–1.030)
Total Protein, Urine: NEGATIVE
Urine Glucose: NEGATIVE
Urobilinogen, UA: 0.2 (ref 0.0–1.0)
pH: 6 (ref 5.0–8.0)

## 2020-06-11 LAB — PSA: PSA: 1.72 ng/mL (ref 0.10–4.00)

## 2020-06-11 LAB — LIPID PANEL
Cholesterol: 173 mg/dL (ref 0–200)
HDL: 44.5 mg/dL (ref >39.00)
Total CHOL/HDL Ratio: 4
Triglycerides: 421 mg/dL — ABNORMAL HIGH (ref 0.0–149.0)

## 2020-06-11 LAB — TSH: TSH: 5.34 u[IU]/mL — ABNORMAL HIGH (ref 0.35–4.50)

## 2020-06-11 MED ORDER — LEVOTHYROXINE SODIUM 88 MCG PO TABS: 88.0000 ug | ORAL_TABLET | Freq: Every day | ORAL | 3 refills | Status: DC

## 2020-06-11 MED ORDER — DIAZEPAM 10 MG PO TABS
5.0000 mg | ORAL_TABLET | Freq: Two times a day (BID) | ORAL | 2 refills | Status: DC | PRN
Start: 2020-06-11 — End: 2020-09-10

## 2020-06-11 MED ORDER — LOSARTAN POTASSIUM 100 MG PO TABS
100.0000 mg | ORAL_TABLET | Freq: Every day | ORAL | 3 refills | Status: DC
Start: 2020-06-11 — End: 2021-06-03

## 2020-06-11 NOTE — Assessment & Plan Note (Signed)
B ears - use a wax removing kit

## 2020-06-11 NOTE — Assessment & Plan Note (Signed)
Pt declined ophth ref

## 2020-06-11 NOTE — Assessment & Plan Note (Signed)
  We discussed age appropriate health related issues, including available/recomended screening tests and vaccinations. Labs were ordered to be later reviewed . All questions were answered. We discussed one or more of the following - seat belt use, use of sunscreen/sun exposure exercise, safe sex, fall risk reduction, second hand smoke exposure, firearm use and storage, seat belt use, a need for adhering to healthy diet and exercise. Labs were ordered.  All questions were answered.  Colon 2019

## 2020-06-11 NOTE — Patient Instructions (Signed)
Please get and use a wax removing kit

## 2020-06-11 NOTE — Addendum Note (Signed)
Addended by: Boris Lown B on: 06/11/2020 07:51 AM   Modules accepted: Orders

## 2020-06-11 NOTE — Progress Notes (Signed)
Subjective:  Patient ID: [name], male    DOB: 07-16-1950  Age: 69 y.o. MRN: [id number]  CC: Annual Exam   HPI [name] presents for anxiety, COPD C/o decr hearing Well exam   Outpatient Medications Prior to Visit  Medication Sig Dispense Refill  . aspirin 81 MG tablet Take 81 mg by mouth daily.      . cholecalciferol (VITAMIN D) 1000 UNITS tablet Take 2,000 Units by mouth daily.     Arna Medici 75 MCG tablet Take 1 tablet by mouth once daily 90 tablet 1  . ezetimibe (ZETIA) 10 MG tablet Take 1 tablet (10 mg total) by mouth daily. 90 tablet 3  . nitroGLYCERIN (NITROSTAT) 0.4 MG SL tablet DISSOLVE ONE TABLET UNDER THE TONGUE EVERY 5 MINUTES AS NEEDED FOR CHEST PAIN.  DO NOT EXCEED A TOTAL OF 3 DOSES IN 15 MINUTES 25 tablet 0  . pantoprazole (PROTONIX) 40 MG tablet Take 1 tablet (40 mg total) by mouth daily. 90 tablet 3  . pravastatin (PRAVACHOL) 20 MG tablet Take 1 tablet (20 mg total) by mouth daily. 90 tablet 3  . diazepam (VALIUM) 10 MG tablet Take 0.5-1 tablets (5-10 mg total) by mouth 2 (two) times daily as needed for anxiety. 60 tablet 2  . losartan (COZAAR) 100 MG tablet Take 1 tablet (100 mg total) by mouth daily. Yearly physical due in March must see MD for refills 90 tablet 3   No facility-administered medications prior to visit.    ROS: Review of Systems  Constitutional: Negative for appetite change, fatigue and unexpected weight change.  HENT: Positive for hearing loss. Negative for congestion, nosebleeds, sneezing, sore throat and trouble swallowing.   Eyes: Negative for itching and visual disturbance.  Respiratory: Positive for shortness of breath. Negative for cough.   Cardiovascular: Negative for chest pain, palpitations and leg swelling.  Gastrointestinal: Negative for abdominal distention, blood in stool, diarrhea and nausea.  Genitourinary: Negative for frequency and hematuria.  Musculoskeletal: Negative for back pain, gait problem, joint  swelling and neck pain.  Skin: Negative for rash.  Neurological: Negative for dizziness, tremors, speech difficulty and weakness.  Psychiatric/Behavioral: Negative for agitation, dysphoric mood and sleep disturbance. The patient is nervous/anxious.     Objective:  BP 112/72 (BP Location: Left Arm)   Pulse 71   Temp 98.4 F (36.9 C) (Oral)   Ht 5' 8.25" (1.734 m)   Wt 194 lb (88 kg)   SpO2 95%   BMI 29.28 kg/m   BP Readings from Last 3 Encounters:  06/11/20 112/72  03/11/20 110/80  12/05/19 (!) 160/90    Wt Readings from Last 3 Encounters:  06/11/20 194 lb (88 kg)  03/11/20 193 lb (87.5 kg)  12/05/19 200 lb (90.7 kg)    Physical Exam Constitutional:      General: He is not in acute distress.    Appearance: He is well-developed.     Comments: NAD  Eyes:     Conjunctiva/sclera: Conjunctivae normal.     Pupils: Pupils are equal, round, and reactive to light.  Neck:     Thyroid: No thyromegaly.     Vascular: No JVD.  Cardiovascular:     Rate and Rhythm: Normal rate and regular rhythm.     Heart sounds: Normal heart sounds. No murmur heard.  No friction rub. No gallop.   Pulmonary:     Effort: Pulmonary effort is normal. No respiratory distress.     Breath sounds: Normal  breath sounds. No wheezing or rales.  Chest:     Chest wall: No tenderness.  Abdominal:     General: Bowel sounds are normal. There is no distension.     Palpations: Abdomen is soft. There is no mass.     Tenderness: There is no abdominal tenderness. There is no guarding or rebound.  Musculoskeletal:        General: No tenderness. Normal range of motion.     Cervical back: Normal range of motion.  Lymphadenopathy:     Cervical: No cervical adenopathy.  Skin:    General: Skin is warm and dry.     Findings: No rash.  Neurological:     Mental Status: He is alert and oriented to person, place, and time.     Cranial Nerves: No cranial nerve deficit.     Motor: No abnormal muscle tone.      Coordination: Coordination normal.     Gait: Gait normal.     Deep Tendon Reflexes: Reflexes are normal and symmetric.  Psychiatric:        Behavior: Behavior normal.        Thought Content: Thought content normal.        Judgment: Judgment normal.   Droopy eyelids Wax B  Lab Results  Component Value Date   WBC 6.2 09/04/2019   HGB 12.5 (L) 09/04/2019   HCT 37.6 (L) 09/04/2019   PLT 197.0 09/04/2019   GLUCOSE 100 (H) 09/04/2019   CHOL 170 09/04/2019   TRIG (H) 09/04/2019    433.0 Triglyceride is over 400; calculations on Lipids are invalid.   HDL 39.10 09/04/2019   LDLDIRECT 42.0 09/04/2019   LDLCALC 59 03/01/2012   ALT 43 09/04/2019   AST 34 09/04/2019   NA 136 09/04/2019   K 3.7 09/04/2019   CL 100 09/04/2019   CREATININE 1.03 09/04/2019   BUN 16 09/04/2019   CO2 27 09/04/2019   TSH 3.33 09/04/2019   PSA 4.64 (H) 09/04/2019    CT Chest Wo Contrast  Result Date: 03/20/2020 CLINICAL DATA:  COPD, previous tobacco abuse, pulmonary nodule EXAM: CT CHEST WITHOUT CONTRAST TECHNIQUE: Multidetector CT imaging of the chest was performed following the standard protocol without IV contrast. COMPARISON:  11/30/2017 FINDINGS: Cardiovascular: Unenhanced imaging of the heart and great vessels demonstrates no pericardial effusion. 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 Mild atherosclerosis of the aortic arch. Normal caliber of the thoracic aorta. Mediastinum/Nodes: No enlarged mediastinal or axillary lymph nodes. Thyroid gland, trachea, and esophagus demonstrate no significant findings. Lungs/Pleura: Diffuse upper lobe predominant emphysema. No acute airspace disease, effusion, or pneumothorax. Multiple small noncalcified pulmonary nodules are again identified, unchanged since prior exam. Index nodules are as follows: Right upper lobe, image 31, 6 mm.  Stable. Left lower lobe, image 116, 4 mm.  Stable. A right middle lobe 3 mm nodule seen previously has calcified in  the interim, consistent with granuloma. No new nodules or masses. Central airways are patent. Upper Abdomen: No acute abnormality. Musculoskeletal: No acute or destructive bony lesions. Reconstructed images demonstrate no additional findings. IMPRESSION: 1. Stable subcentimeter pulmonary nodules as above, largest measuring 6 mm. Given the greater than 2 year stability, no further imaging workup is required. This recommendation follows the consensus statement: Guidelines for Management of Incidental Pulmonary Nodules Detected on CT Images: From the Fleischner Society 2017; Radiology 2017; 284:228-243. 2. No acute intrathoracic process. 3. Aortic Atherosclerosis (ICD10-I70.0) and Emphysema (ICD10-J43.9). Electronically Signed   By: Diana Eves.D.  On: 03/20/2020 23:42    Assessment & Plan:   Fain was seen today for annual exam.  Diagnoses and all orders for this visit:  Well adult exam -     Comp Met (CMET); Future -     Comp Met (CMET) -     TSH -     Urinalysis -     CBC with Differential/Platelet -     Lipid panel -     PSA  Pulmonary nodules -     TSH -     Urinalysis  Atherosclerosis of native coronary artery of native heart without angina pectoris -     CBC with Differential/Platelet  Elevated PSA -     PSA  Other orders -     losartan (COZAAR) 100 MG tablet; Take 1 tablet (100 mg total) by mouth daily. -     diazepam (VALIUM) 10 MG tablet; Take 0.5-1 tablets (5-10 mg total) by mouth 2 (two) times daily as needed for anxiety.     Meds ordered this encounter  Medications  . losartan (COZAAR) 100 MG tablet    Sig: Take 1 tablet (100 mg total) by mouth daily.    Dispense:  90 tablet    Refill:  3  . diazepam (VALIUM) 10 MG tablet    Sig: Take 0.5-1 tablets (5-10 mg total) by mouth 2 (two) times daily as needed for anxiety.    Dispense:  60 tablet    Refill:  2     Follow-up: No follow-ups on file.  Walker Kehr, MD

## 2020-07-11 ENCOUNTER — Telehealth: Payer: Self-pay | Admitting: Internal Medicine

## 2020-07-11 NOTE — Telephone Encounter (Signed)
Patient wondering if Dr. Alain Marion would send him in an antibiotic, patient experiencing a runny nose and is denying a virtual appointment at this time because he states Dr. Alain Marion has done it for him before.

## 2020-07-11 NOTE — Telephone Encounter (Signed)
MD is out of the office.. will forward to his desktop to address when he return.Marland KitchenJohny Chess

## 2020-07-14 NOTE — Telephone Encounter (Signed)
Pls sch a VOV Thx

## 2020-07-15 NOTE — Telephone Encounter (Signed)
Called pt there ws no answer LMOM w/MD response.Marland KitchenJohny [name]

## 2020-07-27 ENCOUNTER — Other Ambulatory Visit: Payer: Self-pay | Admitting: Internal Medicine

## 2020-07-30 IMAGING — DX DG ABDOMEN 2V
2 series · 2 of 2 positions shown · non-contrast
Comparison: CT abdomen 04/04/2018

CLINICAL DATA: Abdominal pain and distention

EXAM:
ABDOMEN - 2 VIEW

[abdomen erect]
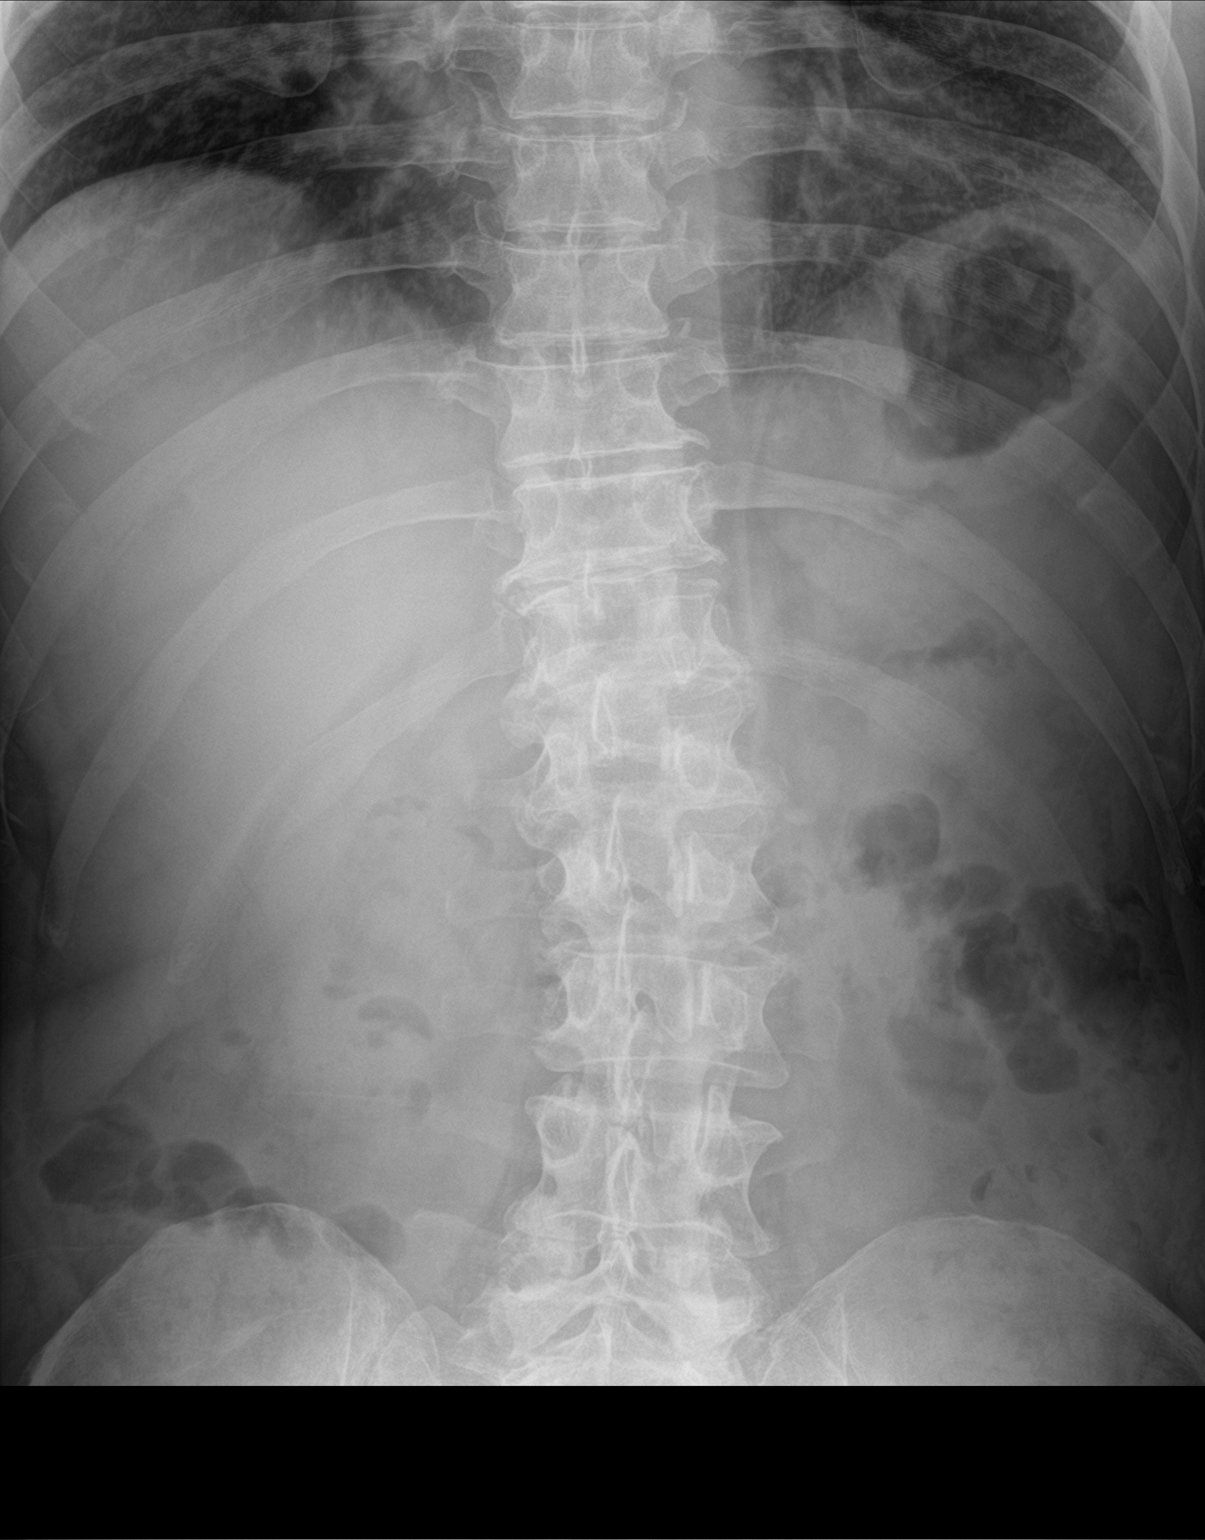

[abdomen supine]
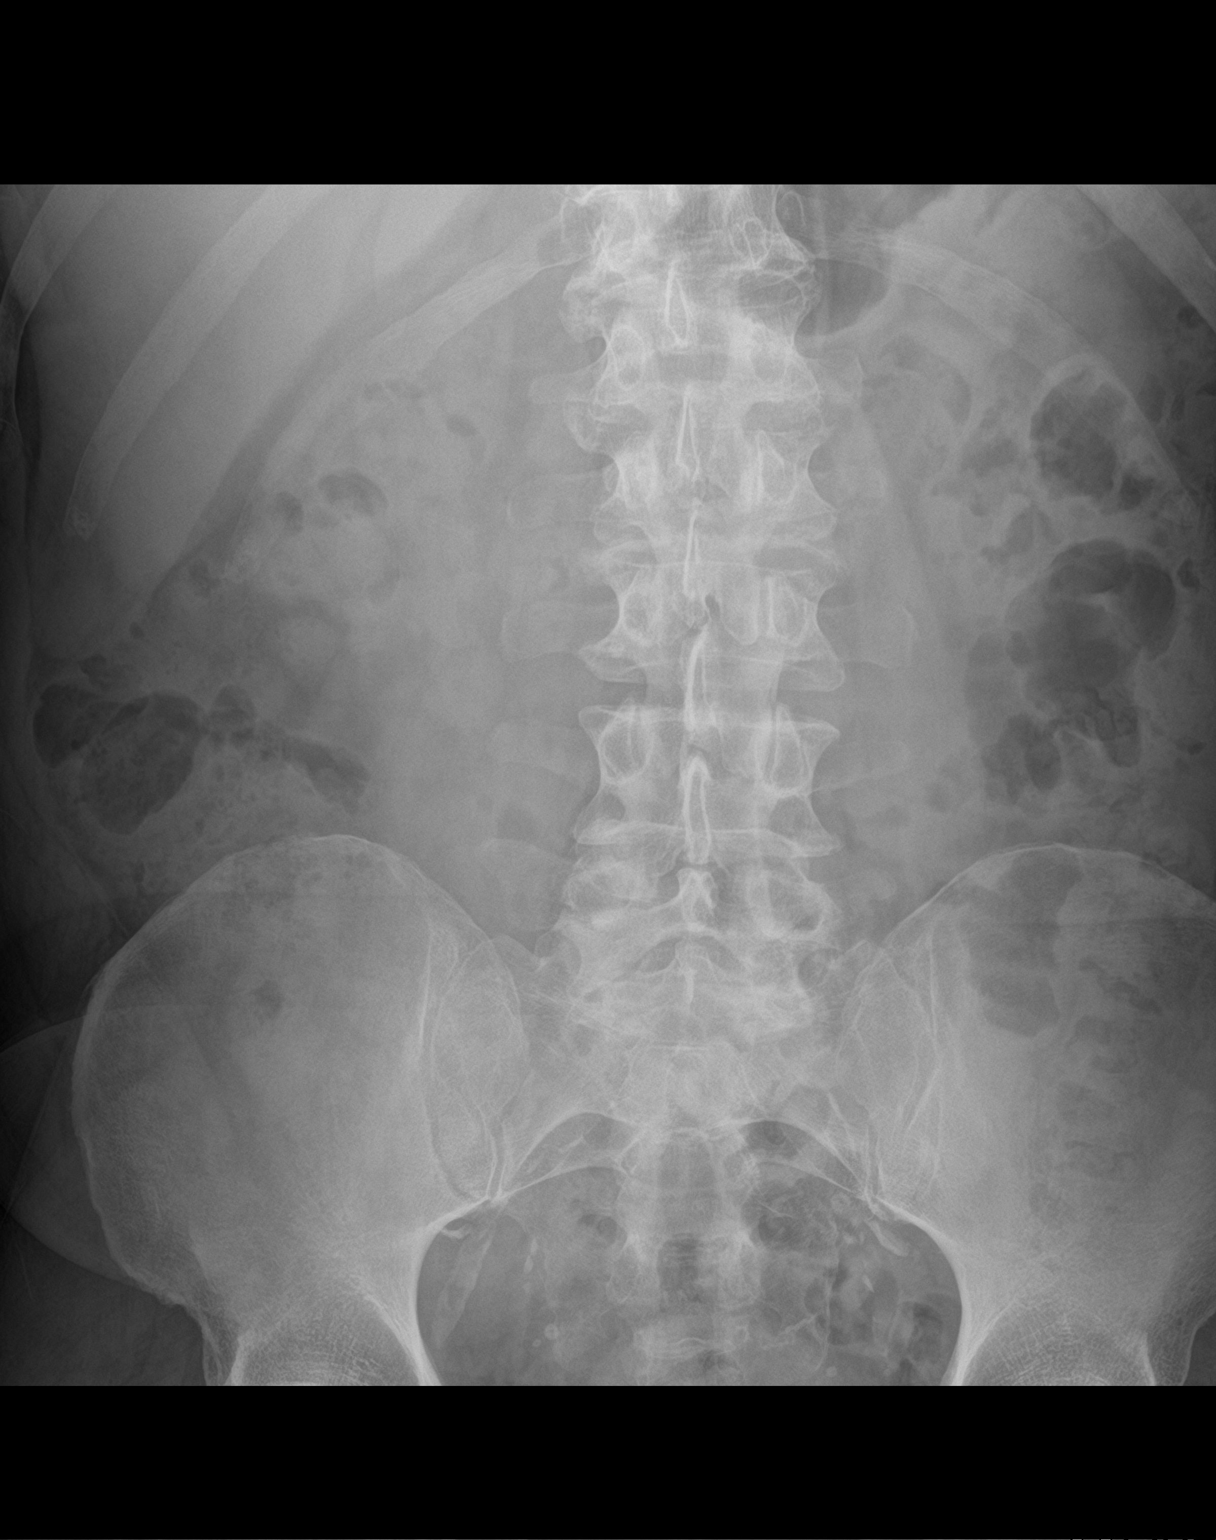

[2 of 2 positions shown; findings below may reference images not displayed]

FINDINGS: Normal bowel gas pattern.  No free air.

Lumbar scoliosis, mild.  Mild arterial calcification.
IMPRESSION: Normal bowel gas pattern

## 2020-08-08 ENCOUNTER — Other Ambulatory Visit: Payer: Self-pay

## 2020-08-08 ENCOUNTER — Telehealth (INDEPENDENT_AMBULATORY_CARE_PROVIDER_SITE_OTHER): Payer: Medicare HMO | Admitting: Family

## 2020-08-08 ENCOUNTER — Telehealth: Payer: Self-pay | Admitting: Internal Medicine

## 2020-08-08 DIAGNOSIS — R591 Generalized enlarged lymph nodes: Secondary | ICD-10-CM

## 2020-08-08 DIAGNOSIS — J209 Acute bronchitis, unspecified: Secondary | ICD-10-CM

## 2020-08-08 MED ORDER — PREDNISONE 20 MG PO TABS: 20.0000 mg | ORAL_TABLET | Freq: Every day | ORAL | 0 refills | Status: DC

## 2020-08-08 MED ORDER — AZITHROMYCIN 250 MG PO TABS: ORAL_TABLET | ORAL | 0 refills | Status: DC

## 2020-08-08 MED ORDER — HYDROCOD POLST-CPM POLST ER 10-8 MG/5ML PO SUER: 5.0000 mL | Freq: Two times a day (BID) | ORAL | 0 refills | Status: DC | PRN

## 2020-08-08 NOTE — Progress Notes (Signed)
[name] is a 70 y.o. male with the following history as recorded in [employer]:  Patient Active Problem List   Diagnosis Date Noted  . Droopy eyelid 06/11/2020  . Elevated PSA 09/04/2019  . Pulmonary nodule 08/24/2018  . Hypothyroidism 08/24/2018  . GERD (gastroesophageal reflux disease) 05/23/2018  . Cerumen impaction 01/20/2018  . Tear of MCL (medial collateral ligament) of knee, left, initial encounter 11/03/2017  . Knee pain, left 10/19/2017  . Blood donor 10/27/2016  . Hypertensive heart disease 06/17/2015  . 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 Chest pain 08/09/2014  . Hx of adenomatous colonic polyps 04/17/2012  . Well adult exam 03/08/2012  . Erectile dysfunction 03/08/2012  . Alcohol abuse 03/08/2012  . Infected cyst of skin 10/07/2011  . History of tobacco abuse 10/07/2011  . COPD (chronic obstructive pulmonary disease) ([place]) 11/10/2010  . Sebaceous cyst 11/10/2010  . Vitamin D deficiency 06/01/2010  . ACUTE BRONCHITIS 08/26/2009  . Diarrhea 10/08/2008  . NECK PAIN 09/16/2008  . SINUSITIS- ACUTE-NOS 09/26/2007  . ATONY OF BLADDER 07/11/2007  . Cough 07/05/2007  . PNEUMONIA, ORGANISM UNSPECIFIED 05/16/2007  . OSTEOARTHRITIS 05/16/2007  . Mixed hyperlipidemia 04/10/2007  . Coronary atherosclerosis 04/10/2007  . Bacterial pneumonia 04/10/2007  . OSTEOPOROSIS 04/10/2007  . Anxiety disorder 03/20/2007  . Depression 03/20/2007  . CHEST PAIN, RIGHT 03/20/2007    Current Outpatient Medications  Medication Sig Dispense Refill  . azithromycin (ZITHROMAX) 250 MG tablet 2 tabs po qd x 1 day; 1 tablet per day x 4 days; 6 tablet 0  . chlorpheniramine-HYDROcodone (TUSSIONEX PENNKINETIC ER) 10-8 MG/5ML SUER Take 5 mLs by mouth every 12 (twelve) hours as needed for cough. 115 mL 0  . predniSONE (DELTASONE) 20 MG tablet Take 1 tablet (20 mg total) by mouth daily with breakfast. 5 tablet 0  . aspirin 81 MG tablet Take 81 mg by mouth daily.      . cholecalciferol  (VITAMIN D) 1000 UNITS tablet Take 2,000 Units by mouth daily.     . diazepam (VALIUM) 10 MG tablet Take 0.5-1 tablets (5-10 mg total) by mouth 2 (two) times daily as needed for anxiety. 60 tablet 2  . ezetimibe (ZETIA) 10 MG tablet Take 1 tablet by mouth once daily 90 tablet 3  . levothyroxine (EUTHYROX) 88 MCG tablet Take 1 tablet (88 mcg total) by mouth daily before breakfast. 90 tablet 3  . losartan (COZAAR) 100 MG tablet Take 1 tablet (100 mg total) by mouth daily. 90 tablet 3  . nitroGLYCERIN (NITROSTAT) 0.4 MG SL tablet DISSOLVE ONE TABLET UNDER THE TONGUE EVERY 5 MINUTES AS NEEDED FOR CHEST PAIN.  DO NOT EXCEED A TOTAL OF 3 DOSES IN 15 MINUTES 25 tablet 0  . pantoprazole (PROTONIX) 40 MG tablet Take 1 tablet (40 mg total) by mouth daily. 90 tablet 3  . pravastatin (PRAVACHOL) 20 MG tablet Take 1 tablet (20 mg total) by mouth daily. 90 tablet 3   No current facility-administered medications for this visit.    Allergies: Atorvastatin, Bee venom, Crestor [rosuvastatin calcium], and Statins  Past Medical History:  Diagnosis Date  . Allergy   . Allergy to bee sting   . Anxiety   . CAD (coronary artery disease)    statin intolerant  . Cyst   . Depression   . Dermatitis due to sun   . ED (erectile dysfunction)   . Hyperlipidemia   . Hypertension   . MI (myocardial infarction) ([place]) 2002  . OA (osteoarthritis)   .  Tobacco abuse   . Vitamin D deficiency 2008    Past Surgical History:  Procedure Laterality Date  . broken jaw     1970-80's   . cardiac catherization  07-14-01  . COLONOSCOPY  2013  . ESOPHAGOGASTRODUODENOSCOPY  10-06-01  . stress cardiolite  06-30-01  . UPPER GASTROINTESTINAL ENDOSCOPY      Family History  Problem Relation Age of Onset  . Cancer Brother        lymphoma  . Heart disease Father   . Colon cancer Neg Hx   . Esophageal cancer Neg Hx   . Rectal cancer Neg Hx   . Stomach cancer Neg Hx   . Colon polyps Neg Hx     Social History   Tobacco Use  .  Smoking status: Former Smoker    Packs/day: 2.00    Types: Cigarettes    Quit date: 11/02/2012    Years since quitting: 7.7  . Smokeless tobacco: Never Used  Substance Use Topics  . Alcohol use: Yes    Alcohol/week: 12.0 standard drinks    Types: 12 Cans of beer per week    Subjective:   I connected with [name] on 08/08/20 at  3:00 PM EST by a video enabled telemedicine application and verified that I am speaking with the correct person using two identifiers.   I discussed the limitations of evaluation and management by telemedicine and the availability of in person appointments. The patient expressed understanding and agreed to proceed. Provider in office/ patient is at home; provider and patient are only 2 people on video call.   Complaining of recurrent cough/ congestion/ sore throat x 2 weeks; notes he was sick initially 2 weeks ago and seemed to improve but symptoms have returned in the past week; painful swollen glands; took home COVID test earlier this week ( ?Wednesday) and it was negative; cough is keeping him awake at night; concerned that he needs antibiotics.    Objective:  There were no vitals filed for this visit.  Lungs: Respirations unlabored;  Neurologic: Alert and oriented; speech intact; face symmetrical; moves all extremities well; CNII-XII intact without focal deficit  Assessment:  1. Acute bronchitis, unspecified organism   2. Lymphadenopathy     Plan:  Rx for Z-pak, Prednisone 20 mg qd x 5 days and Tussionex cough syrup; increase fluids, rest and follow up worse, no better.  No follow-ups on file.  No orders of the defined types were placed in this encounter.   Requested Prescriptions   Signed Prescriptions Disp Refills  . azithromycin (ZITHROMAX) 250 MG tablet 6 tablet 0    Sig: 2 tabs po qd x 1 day; 1 tablet per day x 4 days;  . predniSONE (DELTASONE) 20 MG tablet 5 tablet 0    Sig: Take 1 tablet (20 mg total) by mouth daily with breakfast.   . chlorpheniramine-HYDROcodone (TUSSIONEX PENNKINETIC ER) 10-8 MG/5ML SUER 115 mL 0    Sig: Take 5 mLs by mouth every 12 (twelve) hours as needed for cough.

## 2020-08-08 NOTE — Telephone Encounter (Signed)
Pharmacy called   They are unable to fill the order for chlorpheniramine-HYDROcodone [facility] ER) 10-8 MG/5ML [facility] has the medication and is able to fill the order. Are you able to resend the order to them instead?   [facility]:  [address], [place], [place], [address]  [phone]

## 2020-08-11 ENCOUNTER — Other Ambulatory Visit: Payer: Self-pay | Admitting: Family

## 2020-08-11 MED ORDER — HYDROCOD POLST-CPM POLST ER 10-8 MG/5ML PO SUER: 5.0000 mL | Freq: Two times a day (BID) | ORAL | 0 refills | Status: DC | PRN

## 2020-09-10 ENCOUNTER — Encounter: Payer: Self-pay | Admitting: Internal Medicine

## 2020-09-10 ENCOUNTER — Ambulatory Visit (INDEPENDENT_AMBULATORY_CARE_PROVIDER_SITE_OTHER): Payer: Medicare HMO | Admitting: Internal Medicine

## 2020-09-10 ENCOUNTER — Other Ambulatory Visit: Payer: Self-pay

## 2020-09-10 ENCOUNTER — Other Ambulatory Visit: Payer: Self-pay | Admitting: Internal Medicine

## 2020-09-10 DIAGNOSIS — E039 Hypothyroidism, unspecified: Secondary | ICD-10-CM | POA: Diagnosis not present

## 2020-09-10 DIAGNOSIS — T466X5A Adverse effect of antihyperlipidemic and antiarteriosclerotic drugs, initial encounter: Secondary | ICD-10-CM

## 2020-09-10 DIAGNOSIS — H6121 Impacted cerumen, right ear: Secondary | ICD-10-CM

## 2020-09-10 DIAGNOSIS — E782 Mixed hyperlipidemia: Secondary | ICD-10-CM | POA: Diagnosis not present

## 2020-09-10 DIAGNOSIS — K219 Gastro-esophageal reflux disease without esophagitis: Secondary | ICD-10-CM

## 2020-09-10 DIAGNOSIS — G720 Drug-induced myopathy: Secondary | ICD-10-CM | POA: Diagnosis not present

## 2020-09-10 DIAGNOSIS — R972 Elevated prostate specific antigen [PSA]: Secondary | ICD-10-CM | POA: Diagnosis not present

## 2020-09-10 LAB — COMPREHENSIVE METABOLIC PANEL
ALT: 39 U/L (ref 0–53)
AST: 36 U/L (ref 0–37)
Albumin: 4.1 g/dL (ref 3.5–5.2)
Alkaline Phosphatase: 47 U/L (ref 39–117)
BUN: 10 mg/dL (ref 6–23)
CO2: 26 mEq/L (ref 19–32)
Calcium: 9.8 mg/dL (ref 8.4–10.5)
Chloride: 102 mEq/L (ref 96–112)
Creatinine, Ser: 0.91 mg/dL (ref 0.40–1.50)
GFR: 85.96 mL/min (ref >60.00)
Glucose, Bld: 89 mg/dL (ref 70–99)
Potassium: 4.4 mEq/L (ref 3.5–5.1)
Sodium: 136 mEq/L (ref 135–145)
Total Bilirubin: 0.4 mg/dL (ref 0.2–1.2)
Total Protein: 8.4 g/dL — ABNORMAL HIGH (ref 6.0–8.3)

## 2020-09-10 LAB — TSH: TSH: 2.23 u[IU]/mL (ref 0.35–4.50)

## 2020-09-10 LAB — T4, FREE: Free T4: 0.86 ng/dL (ref 0.60–1.60)

## 2020-09-10 MED ORDER — PRAVASTATIN SODIUM 20 MG PO TABS: 20.0000 mg | ORAL_TABLET | Freq: Every day | ORAL | 3 refills | Status: DC

## 2020-09-10 MED ORDER — DIAZEPAM 10 MG PO TABS: 5.0000 mg | ORAL_TABLET | Freq: Two times a day (BID) | ORAL | 2 refills | Status: DC | PRN

## 2020-09-10 NOTE — Assessment & Plan Note (Signed)
Protonix.  ?

## 2020-09-10 NOTE — Progress Notes (Signed)
Patient consent obtained. Irrigation with water and peroxide performed. Full view of tympanic membranes after procedure.  Patient tolerated procedure well.   

## 2020-09-10 NOTE — Assessment & Plan Note (Signed)
On Zetia 

## 2020-09-10 NOTE — Assessment & Plan Note (Signed)
R: see procedure ?

## 2020-09-10 NOTE — Progress Notes (Signed)
Subjective:  Patient ID: [name], male    DOB: 11/22/1950  Age: 70 y.o. MRN: [id number]  CC: Follow-up (3 month f/u- Req refill on Diazepam and Pravastatin)   HPI [name] presents for anxiety, coronary disease, hypothyroidism follow-up.  He is complaining of earwax  Outpatient Medications Prior to Visit  Medication Sig Dispense Refill  . aspirin 81 MG tablet Take 81 mg by mouth daily.    . cholecalciferol (VITAMIN D) 1000 UNITS tablet Take 2,000 Units by mouth daily.    [name] ezetimibe (ZETIA) 10 MG tablet Take 1 tablet by mouth once daily 90 tablet 3  . levothyroxine (EUTHYROX) 88 MCG tablet Take 1 tablet (88 mcg total) by mouth daily before breakfast. 90 tablet 3  . losartan (COZAAR) 100 MG tablet Take 1 tablet (100 mg total) by mouth daily. 90 tablet 3  . nitroGLYCERIN (NITROSTAT) 0.4 MG SL tablet DISSOLVE ONE TABLET UNDER THE TONGUE EVERY 5 MINUTES AS NEEDED FOR CHEST PAIN.  DO NOT EXCEED A TOTAL OF 3 DOSES IN 15 MINUTES 25 tablet 0  . pantoprazole (PROTONIX) 40 MG tablet Take 1 tablet (40 mg total) by mouth daily. 90 tablet 3  . predniSONE (DELTASONE) 20 MG tablet Take 1 tablet (20 mg total) by mouth daily with breakfast. 5 tablet 0  . diazepam (VALIUM) 10 MG tablet Take 0.5-1 tablets (5-10 mg total) by mouth 2 (two) times daily as needed for anxiety. 60 tablet 2  . pravastatin (PRAVACHOL) 20 MG tablet Take 1 tablet (20 mg total) by mouth daily. 90 tablet 3  . azithromycin (ZITHROMAX) 250 MG tablet 2 tabs po qd x 1 day; 1 tablet per day x 4 days; (Patient not taking: Reported on 09/10/2020) 6 tablet 0  . chlorpheniramine-HYDROcodone (TUSSIONEX PENNKINETIC ER) 10-8 MG/5ML SUER Take 5 mLs by mouth every 12 (twelve) hours as needed for cough. 115 mL 0   No facility-administered medications prior to visit.    ROS: Review of Systems  Constitutional: Negative for appetite change, fatigue and unexpected weight change.  HENT: Positive for hearing loss. Negative for  congestion, nosebleeds, sneezing, sore throat and trouble swallowing.   Eyes: Negative for itching and visual disturbance.  Respiratory: Negative for cough.   Cardiovascular: Negative for chest pain, palpitations and leg swelling.  Gastrointestinal: Negative for abdominal distention, blood in stool, diarrhea and nausea.  Genitourinary: Negative for frequency and hematuria.  Musculoskeletal: Negative for back pain, gait problem, joint swelling and neck pain.  Skin: Negative for rash.  Neurological: Negative for dizziness, tremors, speech difficulty and weakness.  Psychiatric/Behavioral: Negative for agitation, dysphoric mood, sleep disturbance and suicidal ideas. The patient is nervous/anxious.     Objective:  BP 130/70 (BP Location: Left Arm)   Pulse 70   Temp 98.1 F (36.7 C) (Oral)   Ht 5' 8.25" (1.734 m)   Wt 197 lb (89.4 kg)   SpO2 96%   BMI 29.73 kg/m   BP Readings from Last 3 Encounters:  09/10/20 130/70  06/11/20 112/72  03/11/20 110/80    Wt Readings from Last 3 Encounters:  09/10/20 197 lb (89.4 kg)  06/11/20 194 lb (88 kg)  03/11/20 193 lb (87.5 kg)    Physical Exam Constitutional:      General: He is not in acute distress.    Appearance: He is well-developed.     Comments: NAD  Eyes:     Conjunctiva/sclera: Conjunctivae normal.     Pupils: Pupils are equal, round, and  reactive to light.  Neck:     Thyroid: No thyromegaly.     Vascular: No JVD.  Cardiovascular:     Rate and Rhythm: Normal rate and regular rhythm.     Heart sounds: Normal heart sounds. No murmur heard. No friction rub. No gallop.   Pulmonary:     Effort: Pulmonary effort is normal. No respiratory distress.     Breath sounds: Normal breath sounds. No wheezing or rales.  Chest:     Chest wall: No tenderness.  Abdominal:     General: Bowel sounds are normal. There is no distension.     Palpations: Abdomen is soft. There is no mass.     Tenderness: There is no abdominal tenderness. There  is no guarding or rebound.  Musculoskeletal:        General: No tenderness. Normal range of motion.     Cervical back: Normal range of motion.  Lymphadenopathy:     Cervical: No cervical adenopathy.  Skin:    General: Skin is warm and dry.     Findings: No rash.  Neurological:     Mental Status: He is alert and oriented to person, place, and time.     Cranial Nerves: No cranial nerve deficit.     Motor: No abnormal muscle tone.     Coordination: Coordination normal.     Gait: Gait normal.     Deep Tendon Reflexes: Reflexes are normal and symmetric.  Psychiatric:        Behavior: Behavior normal.        Thought Content: Thought content normal.        Judgment: Judgment normal.      R ear wax   Procedure Note :     Procedure :  Ear irrigation right    Indication:  Cerumen impaction right   Risks, including pain, dizziness, eardrum perforation, bleeding, infection and others as well as benefits were explained to the patient in detail. Verbal consent was obtained and the patient agreed to proceed.    We used "The Elephant Ear Irrigation Device" filled with lukewarm water for irrigation. A large amount wax was recovered from R ear. Procedure has also required manual wax removal/instrumentation with an ear wax curette and ear forceps on the right  Ear.   Tolerated well. Complications: None.   Postprocedure instructions :  Call if problems.   Lab Results  Component Value Date   WBC 5.6 06/11/2020   HGB 13.2 06/11/2020   HCT 39.7 06/11/2020   PLT 195.0 06/11/2020   GLUCOSE 92 06/11/2020   CHOL 173 06/11/2020   TRIG 421.0 (H) 06/11/2020   HDL 44.50 06/11/2020   LDLDIRECT 45.0 06/11/2020   LDLCALC 59 03/01/2012   ALT 41 06/11/2020   AST 38 (H) 06/11/2020   NA 136 06/11/2020   K 3.8 06/11/2020   CL 101 06/11/2020   CREATININE 1.03 06/11/2020   BUN 15 06/11/2020   CO2 27 06/11/2020   TSH 5.34 (H) 06/11/2020   PSA 1.72 06/11/2020    CT Chest Wo Contrast  Result  Date: 03/20/2020 CLINICAL DATA:  COPD, previous tobacco abuse, pulmonary nodule EXAM: CT CHEST WITHOUT CONTRAST TECHNIQUE: Multidetector CT imaging of the chest was performed following the standard protocol without IV contrast. COMPARISON:  11/30/2017 FINDINGS: Cardiovascular: Unenhanced imaging of the heart and great vessels demonstrates no pericardial effusion. 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 Mild atherosclerosis of the aortic arch. Normal caliber of the thoracic aorta. Mediastinum/Nodes: No enlarged mediastinal  or axillary lymph nodes. Thyroid gland, trachea, and esophagus demonstrate no significant findings. Lungs/Pleura: Diffuse upper lobe predominant emphysema. No acute airspace disease, effusion, or pneumothorax. Multiple small noncalcified pulmonary nodules are again identified, unchanged since prior exam. Index nodules are as follows: Right upper lobe, image 31, 6 mm.  Stable. Left lower lobe, image 116, 4 mm.  Stable. A right middle lobe 3 mm nodule seen previously has calcified in the interim, consistent with granuloma. No new nodules or masses. Central airways are patent. Upper Abdomen: No acute abnormality. Musculoskeletal: No acute or destructive bony lesions. Reconstructed images demonstrate no additional findings. IMPRESSION: 1. Stable subcentimeter pulmonary nodules as above, largest measuring 6 mm. Given the greater than 2 year stability, no further imaging workup is required. This recommendation follows the consensus statement: Guidelines for Management of Incidental Pulmonary Nodules Detected on CT Images: From the Fleischner Society 2017; Radiology 2017; 284:228-243. 2. No acute intrathoracic process. 3. Aortic Atherosclerosis (ICD10-I70.0) and Emphysema (ICD10-J43.9). Electronically Signed   By: Randa Ngo M.D.   On: 03/20/2020 23:42    Assessment & Plan:   There are no diagnoses linked to this encounter.   Meds ordered this encounter  Medications   . pravastatin (PRAVACHOL) 20 MG tablet    Sig: Take 1 tablet (20 mg total) by mouth daily.    Dispense:  90 tablet    Refill:  3  . diazepam (VALIUM) 10 MG tablet    Sig: Take 0.5-1 tablets (5-10 mg total) by mouth 2 (two) times daily as needed for anxiety.    Dispense:  60 tablet    Refill:  2     Follow-up: No follow-ups on file.  Walker Kehr, MD

## 2020-09-10 NOTE — Assessment & Plan Note (Signed)
On Levothroid 

## 2020-09-10 NOTE — Assessment & Plan Note (Signed)
Check PSA. ?

## 2020-09-24 ENCOUNTER — Other Ambulatory Visit: Payer: Self-pay | Admitting: Internal Medicine

## 2020-09-29 DIAGNOSIS — G720 Drug-induced myopathy: Secondary | ICD-10-CM | POA: Insufficient documentation

## 2020-09-29 NOTE — Assessment & Plan Note (Signed)
We can try pravastatin again.

## 2020-12-15 ENCOUNTER — Ambulatory Visit (INDEPENDENT_AMBULATORY_CARE_PROVIDER_SITE_OTHER): Payer: Medicare HMO | Admitting: Internal Medicine

## 2020-12-15 ENCOUNTER — Other Ambulatory Visit: Payer: Self-pay

## 2020-12-15 ENCOUNTER — Encounter: Payer: Self-pay | Admitting: Internal Medicine

## 2020-12-15 DIAGNOSIS — I700 Atherosclerosis of aorta: Secondary | ICD-10-CM

## 2020-12-15 DIAGNOSIS — Z0000 Encounter for general adult medical examination without abnormal findings: Secondary | ICD-10-CM

## 2020-12-15 DIAGNOSIS — J449 Chronic obstructive pulmonary disease, unspecified: Secondary | ICD-10-CM | POA: Diagnosis not present

## 2020-12-15 DIAGNOSIS — R972 Elevated prostate specific antigen [PSA]: Secondary | ICD-10-CM

## 2020-12-15 DIAGNOSIS — E782 Mixed hyperlipidemia: Secondary | ICD-10-CM

## 2020-12-15 DIAGNOSIS — F411 Generalized anxiety disorder: Secondary | ICD-10-CM | POA: Diagnosis not present

## 2020-12-15 DIAGNOSIS — F1010 Alcohol abuse, uncomplicated: Secondary | ICD-10-CM

## 2020-12-15 DIAGNOSIS — Z52008 Unspecified donor, other blood: Secondary | ICD-10-CM | POA: Diagnosis not present

## 2020-12-15 DIAGNOSIS — I25729 Atherosclerosis of autologous artery coronary artery bypass graft(s) with unspecified angina pectoris: Secondary | ICD-10-CM

## 2020-12-15 DIAGNOSIS — R69 Illness, unspecified: Secondary | ICD-10-CM | POA: Diagnosis not present

## 2020-12-15 MED ORDER — ANORO ELLIPTA 62.5-25 MCG/INH IN AEPB: 1.0000 | INHALATION_SPRAY | Freq: Every day | RESPIRATORY_TRACT | 11 refills | Status: DC

## 2020-12-15 MED ORDER — DIAZEPAM 10 MG PO TABS: ORAL_TABLET | ORAL | 3 refills | Status: DC

## 2020-12-15 NOTE — Assessment & Plan Note (Signed)
Twice a week - discussed

## 2020-12-15 NOTE — Assessment & Plan Note (Signed)
Stopped 2020

## 2020-12-15 NOTE — Assessment & Plan Note (Signed)
Diazepam prn Chronic  Potential benefits of a long term benzodiazepines  use as well as potential risks  and complications were explained to the patient and were aknowledged. Not using Rx w/alcohol

## 2020-12-15 NOTE — Assessment & Plan Note (Signed)
On Pravastatin po

## 2020-12-15 NOTE — Assessment & Plan Note (Signed)
On zetia.  

## 2020-12-15 NOTE — Assessment & Plan Note (Signed)
Use Anoro

## 2020-12-15 NOTE — Assessment & Plan Note (Signed)
Monitoring PSA 

## 2020-12-15 NOTE — Progress Notes (Signed)
Subjective:  Patient ID: [name], male    DOB: [birth year]  Age: 70 y.o. MRN: [id number]  CC: Follow-up (3 month f/u)   HPI [name] presents for  anxiety, dyslipidemia, hypothyroidism, COPD  Get a tDAP at the pharmacy  Outpatient Medications Prior to Visit  Medication Sig Dispense Refill   aspirin 81 MG tablet Take 81 mg by mouth daily.     cholecalciferol (VITAMIN D) 1000 UNITS tablet Take 2,000 Units by mouth daily.     diazepam (VALIUM) 10 MG tablet TAKE 1/2 TO 1 (ONE-HALF TO ONE) TABLET BY MOUTH TWICE DAILY AS NEEDED FOR ANXIETY 60 tablet 3   ezetimibe (ZETIA) 10 MG tablet Take 1 tablet by mouth once daily 90 tablet 3   levothyroxine (EUTHYROX) 88 MCG tablet Take 1 tablet (88 mcg total) by mouth daily before breakfast. 90 tablet 3   losartan (COZAAR) 100 MG tablet Take 1 tablet (100 mg total) by mouth daily. 90 tablet 3   nitroGLYCERIN (NITROSTAT) 0.4 MG SL tablet DISSOLVE ONE TABLET UNDER THE TONGUE EVERY 5 MINUTES AS NEEDED FOR CHEST PAIN.  DO NOT EXCEED A TOTAL OF 3 DOSES IN 15 MINUTES 25 tablet 0   pantoprazole (PROTONIX) 40 MG tablet Take 1 tablet (40 mg total) by mouth daily. 90 tablet 3   pravastatin (PRAVACHOL) 20 MG tablet Take 1 tablet (20 mg total) by mouth daily. 90 tablet 3   predniSONE (DELTASONE) 20 MG tablet Take 1 tablet (20 mg total) by mouth daily with breakfast. 5 tablet 0   pravastatin (PRAVACHOL) 20 MG tablet Take 1 tablet by mouth once daily 90 tablet 3   No facility-administered medications prior to visit.    ROS: Review of Systems  Constitutional:  Negative for appetite change, fatigue and unexpected weight change.  HENT:  Negative for congestion, nosebleeds, sneezing, sore throat and trouble swallowing.   Eyes:  Negative for itching and visual disturbance.  Respiratory:  Negative for cough.   Cardiovascular:  Negative for chest pain, palpitations and leg swelling.  Gastrointestinal:  Negative for abdominal distention, blood in stool,  diarrhea and nausea.  Genitourinary:  Negative for frequency and hematuria.  Musculoskeletal:  Negative for back pain, gait problem, joint swelling and neck pain.  Skin:  Negative for rash.  Neurological:  Negative for dizziness, tremors, speech difficulty and weakness.  Psychiatric/Behavioral:  Negative for agitation, dysphoric mood, sleep disturbance and suicidal ideas. The patient is not nervous/anxious.    Objective:  BP (!) 110/58 (BP Location: Left Arm)   Pulse 72   Temp 98 F (36.7 C) (Oral)   Ht 5' 8.25" (1.734 m)   Wt 193 lb 6.4 oz (87.7 kg)   SpO2 95%   BMI 29.19 kg/m   BP Readings from Last 3 Encounters:  12/15/20 (!) 110/58  09/10/20 130/70  06/11/20 112/72    Wt Readings from Last 3 Encounters:  12/15/20 193 lb 6.4 oz (87.7 kg)  09/10/20 197 lb (89.4 kg)  06/11/20 194 lb (88 kg)    Physical Exam Constitutional:      General: He is not in acute distress.    Appearance: He is well-developed.     Comments: NAD  Eyes:     Conjunctiva/sclera: Conjunctivae normal.     Pupils: Pupils are equal, round, and reactive to light.  Neck:     Thyroid: No thyromegaly.     Vascular: No JVD.  Cardiovascular:     Rate and Rhythm: Normal rate  and regular rhythm.     Heart sounds: Normal heart sounds. No murmur heard.   No friction rub. No gallop.  Pulmonary:     Effort: Pulmonary effort is normal. No respiratory distress.     Breath sounds: Normal breath sounds. No wheezing or rales.  Chest:     Chest wall: No tenderness.  Abdominal:     General: Bowel sounds are normal. There is no distension.     Palpations: Abdomen is soft. There is no mass.     Tenderness: There is no abdominal tenderness. There is no guarding or rebound.  Musculoskeletal:        General: No tenderness. Normal range of motion.     Cervical back: Normal range of motion.  Lymphadenopathy:     Cervical: No cervical adenopathy.  Skin:    General: Skin is warm and dry.     Findings: No rash.   Neurological:     Mental Status: He is alert and oriented to person, place, and time.     Cranial Nerves: No cranial nerve deficit.     Motor: No abnormal muscle tone.     Coordination: Coordination normal.     Gait: Gait normal.     Deep Tendon Reflexes: Reflexes are normal and symmetric.  Psychiatric:        Behavior: Behavior normal.        Thought Content: Thought content normal.        Judgment: Judgment normal.    Lab Results  Component Value Date   WBC 5.6 06/11/2020   HGB 13.2 06/11/2020   HCT 39.7 06/11/2020   PLT 195.0 06/11/2020   GLUCOSE 89 09/10/2020   CHOL 173 06/11/2020   TRIG 421.0 (H) 06/11/2020   HDL 44.50 06/11/2020   LDLDIRECT 45.0 06/11/2020   LDLCALC 59 03/01/2012   ALT 39 09/10/2020   AST 36 09/10/2020   NA 136 09/10/2020   K 4.4 09/10/2020   CL 102 09/10/2020   CREATININE 0.91 09/10/2020   BUN 10 09/10/2020   CO2 26 09/10/2020   TSH 2.23 09/10/2020   PSA 1.72 06/11/2020    CT Chest Wo Contrast  Result Date: 03/20/2020 CLINICAL DATA:  COPD, previous tobacco abuse, pulmonary nodule EXAM: CT CHEST WITHOUT CONTRAST TECHNIQUE: Multidetector CT imaging of the chest was performed following the standard protocol without IV contrast. COMPARISON:  11/30/2017 FINDINGS: Cardiovascular: Unenhanced imaging of the heart and great vessels demonstrates no pericardial effusion. 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 Mild atherosclerosis of the aortic arch. Normal caliber of the thoracic aorta. Mediastinum/Nodes: No enlarged mediastinal or axillary lymph nodes. Thyroid gland, trachea, and esophagus demonstrate no significant findings. Lungs/Pleura: Diffuse upper lobe predominant emphysema. No acute airspace disease, effusion, or pneumothorax. Multiple small noncalcified pulmonary nodules are again identified, unchanged since prior exam. Index nodules are as follows: Right upper lobe, image 31, 6 mm.  Stable. Left lower lobe, image 116, 4 mm.   Stable. A right middle lobe 3 mm nodule seen previously has calcified in the interim, consistent with granuloma. No new nodules or masses. Central airways are patent. Upper Abdomen: No acute abnormality. Musculoskeletal: No acute or destructive bony lesions. Reconstructed images demonstrate no additional findings. IMPRESSION: 1. Stable subcentimeter pulmonary nodules as above, largest measuring 6 mm. Given the greater than 2 year stability, no further imaging workup is required. This recommendation follows the consensus statement: Guidelines for Management of Incidental Pulmonary Nodules Detected on CT Images: From the Fleischner Society 2017; Radiology 2017;  [id number]. 2. No acute intrathoracic process. 3. Aortic Atherosclerosis (ICD10-I70.0) and Emphysema (ICD10-J43.9). Electronically Signed   By: Randa Ngo M.D.   On: 03/20/2020 23:42    Assessment & Plan:   There are no diagnoses linked to this encounter.   No orders of the defined types were placed in this encounter.    Follow-up: No follow-ups on file.  Walker Kehr, MD

## 2020-12-15 NOTE — Assessment & Plan Note (Addendum)
On Zetia, Pravachol NTG prn

## 2021-02-11 ENCOUNTER — Telehealth: Payer: Self-pay | Admitting: Internal Medicine
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
## 2021-02-12 NOTE — Telephone Encounter (Signed)
Called pt there was no answer LMOM w/MD response../lmb 

## 2021-02-12 NOTE — Telephone Encounter (Signed)
I am sorry.  Take Tylenol as needed.  See any provider this week.  Thanks

## 2021-03-18 ENCOUNTER — Other Ambulatory Visit: Payer: Self-pay

## 2021-03-18 ENCOUNTER — Encounter: Payer: Self-pay | Admitting: Internal Medicine

## 2021-03-18 ENCOUNTER — Ambulatory Visit (INDEPENDENT_AMBULATORY_CARE_PROVIDER_SITE_OTHER): Payer: Medicare HMO | Admitting: Internal Medicine

## 2021-03-18 VITALS — BP 138/72 | HR 74 | Temp 98.2°F | Ht 68.25 in | Wt 191.4 lb

## 2021-03-18 DIAGNOSIS — Z0000 Encounter for general adult medical examination without abnormal findings: Secondary | ICD-10-CM

## 2021-03-18 DIAGNOSIS — R69 Illness, unspecified: Secondary | ICD-10-CM | POA: Diagnosis not present

## 2021-03-18 DIAGNOSIS — I2583 Coronary atherosclerosis due to lipid rich plaque: Secondary | ICD-10-CM | POA: Diagnosis not present

## 2021-03-18 DIAGNOSIS — F1011 Alcohol abuse, in remission: Secondary | ICD-10-CM | POA: Diagnosis not present

## 2021-03-18 DIAGNOSIS — I2510 Atherosclerotic heart disease of native coronary artery without angina pectoris: Secondary | ICD-10-CM

## 2021-03-18 DIAGNOSIS — F411 Generalized anxiety disorder: Secondary | ICD-10-CM

## 2021-03-18 DIAGNOSIS — Z23 Encounter for immunization: Secondary | ICD-10-CM

## 2021-03-18 DIAGNOSIS — I700 Atherosclerosis of aorta: Secondary | ICD-10-CM | POA: Diagnosis not present

## 2021-03-18 NOTE — Assessment & Plan Note (Addendum)
Continue on Diazepam prn  Potential benefits of a long term benzodiazepines  use as well as potential risks  and complications were explained to the patient and were aknowledged.  Wife is ill w/COPD on O2

## 2021-03-18 NOTE — Assessment & Plan Note (Addendum)
Cont on Pravastatin 

## 2021-03-18 NOTE — Progress Notes (Signed)
Subjective:  Patient ID: [name], male    DOB: [birth year]  Age: 70 y.o. MRN: [id number]  CC: Follow-up (3 month f/u- Flu shot)   HPI [name] presents for anxiety, CAD, hypothyroidism  Outpatient Medications Prior to Visit  Medication Sig Dispense Refill   aspirin 81 MG tablet Take 81 mg by mouth daily.     cholecalciferol (VITAMIN D) 1000 UNITS tablet Take 2,000 Units by mouth daily.     diazepam (VALIUM) 10 MG tablet TAKE 1/2 TO 1 (ONE-HALF TO ONE) TABLET BY MOUTH TWICE DAILY AS NEEDED FOR ANXIETY 60 tablet 3   ezetimibe (ZETIA) 10 MG tablet Take 1 tablet by mouth once daily 90 tablet 3   levothyroxine (EUTHYROX) 88 MCG tablet Take 1 tablet (88 mcg total) by mouth daily before breakfast. 90 tablet 3   losartan (COZAAR) 100 MG tablet Take 1 tablet (100 mg total) by mouth daily. 90 tablet 3   nitroGLYCERIN (NITROSTAT) 0.4 MG SL tablet DISSOLVE ONE TABLET UNDER THE TONGUE EVERY 5 MINUTES AS NEEDED FOR CHEST PAIN.  DO NOT EXCEED A TOTAL OF 3 DOSES IN 15 MINUTES 25 tablet 0   pantoprazole (PROTONIX) 40 MG tablet Take 1 tablet (40 mg total) by mouth daily. 90 tablet 3   pravastatin (PRAVACHOL) 20 MG tablet Take 1 tablet (20 mg total) by mouth daily. 90 tablet 3   predniSONE (DELTASONE) 20 MG tablet Take 1 tablet (20 mg total) by mouth daily with breakfast. 5 tablet 0   umeclidinium-vilanterol (ANORO ELLIPTA) 62.5-25 MCG/INH AEPB Inhale 1 puff into the lungs daily. (Patient not taking: Reported on 03/18/2021) 1 each 11   No facility-administered medications prior to visit.    ROS: Review of Systems  Constitutional:  Negative for appetite change, fatigue and unexpected weight change.  HENT:  Positive for hearing loss. Negative for congestion, nosebleeds, sneezing, sore throat and trouble swallowing.   Eyes:  Negative for itching and visual disturbance.  Respiratory:  Negative for cough.   Cardiovascular:  Negative for chest pain, palpitations and leg swelling.   Gastrointestinal:  Negative for abdominal distention, blood in stool, diarrhea and nausea.  Genitourinary:  Negative for frequency and hematuria.  Musculoskeletal:  Positive for arthralgias. Negative for back pain, gait problem, joint swelling and neck pain.  Skin:  Negative for rash.  Neurological:  Negative for dizziness, tremors, speech difficulty and weakness.  Psychiatric/Behavioral:  Negative for agitation, dysphoric mood, sleep disturbance and suicidal ideas. The patient is nervous/anxious.    Objective:  BP 138/72 (BP Location: Left Arm)   Pulse 74   Temp 98.2 F (36.8 C) (Oral)   Ht 5' 8.25" (1.734 m)   Wt 191 lb 6.4 oz (86.8 kg)   SpO2 96%   BMI 28.89 kg/m   BP Readings from Last 3 Encounters:  03/18/21 138/72  12/15/20 (!) 110/58  09/10/20 130/70    Wt Readings from Last 3 Encounters:  03/18/21 191 lb 6.4 oz (86.8 kg)  12/15/20 193 lb 6.4 oz (87.7 kg)  09/10/20 197 lb (89.4 kg)    Physical Exam Constitutional:      General: He is not in acute distress.    Appearance: He is well-developed.     Comments: NAD  Eyes:     Conjunctiva/sclera: Conjunctivae normal.     Pupils: Pupils are equal, round, and reactive to light.  Neck:     Thyroid: No thyromegaly.     Vascular: No JVD.  Cardiovascular:  Rate and Rhythm: Normal rate and regular rhythm.     Heart sounds: Normal heart sounds. No murmur heard.   No friction rub. No gallop.  Pulmonary:     Effort: Pulmonary effort is normal. No respiratory distress.     Breath sounds: Normal breath sounds. No wheezing or rales.  Chest:     Chest wall: No tenderness.  Abdominal:     General: Bowel sounds are normal. There is no distension.     Palpations: Abdomen is soft. There is no mass.     Tenderness: There is no abdominal tenderness. There is no guarding or rebound.  Musculoskeletal:        General: No tenderness. Normal range of motion.     Cervical back: Normal range of motion.  Lymphadenopathy:      Cervical: No cervical adenopathy.  Skin:    General: Skin is warm and dry.     Findings: No rash.  Neurological:     Mental Status: He is alert and oriented to person, place, and time.     Cranial Nerves: No cranial nerve deficit.     Motor: No abnormal muscle tone.     Coordination: Coordination normal.     Gait: Gait normal.     Deep Tendon Reflexes: Reflexes are normal and symmetric.  Psychiatric:        Behavior: Behavior normal.        Thought Content: Thought content normal.        Judgment: Judgment normal.  Stiff neck, LS spine  Lab Results  Component Value Date   WBC 5.6 06/11/2020   HGB 13.2 06/11/2020   HCT 39.7 06/11/2020   PLT 195.0 06/11/2020   GLUCOSE 89 09/10/2020   CHOL 173 06/11/2020   TRIG 421.0 (H) 06/11/2020   HDL 44.50 06/11/2020   LDLDIRECT 45.0 06/11/2020   LDLCALC 59 03/01/2012   ALT 39 09/10/2020   AST 36 09/10/2020   NA 136 09/10/2020   K 4.4 09/10/2020   CL 102 09/10/2020   CREATININE 0.91 09/10/2020   BUN 10 09/10/2020   CO2 26 09/10/2020   TSH 2.23 09/10/2020   PSA 1.72 06/11/2020    CT Chest Wo Contrast  Result Date: 03/20/2020 CLINICAL DATA:  COPD, previous tobacco abuse, pulmonary nodule EXAM: CT CHEST WITHOUT CONTRAST TECHNIQUE: Multidetector CT imaging of the chest was performed following the standard protocol without IV contrast. COMPARISON:  11/30/2017 FINDINGS: Cardiovascular: Unenhanced imaging of the heart and great vessels demonstrates no pericardial effusion. 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 Mild atherosclerosis of the aortic arch. Normal caliber of the thoracic aorta. Mediastinum/Nodes: No enlarged mediastinal or axillary lymph nodes. Thyroid gland, trachea, and esophagus demonstrate no significant findings. Lungs/Pleura: Diffuse upper lobe predominant emphysema. No acute airspace disease, effusion, or pneumothorax. Multiple small noncalcified pulmonary nodules are again identified, unchanged since  prior exam. Index nodules are as follows: Right upper lobe, image 31, 6 mm.  Stable. Left lower lobe, image 116, 4 mm.  Stable. A right middle lobe 3 mm nodule seen previously has calcified in the interim, consistent with granuloma. No new nodules or masses. Central airways are patent. Upper Abdomen: No acute abnormality. Musculoskeletal: No acute or destructive bony lesions. Reconstructed images demonstrate no additional findings. IMPRESSION: 1. Stable subcentimeter pulmonary nodules as above, largest measuring 6 mm. Given the greater than 2 year stability, no further imaging workup is required. This recommendation follows the consensus statement: Guidelines for Management of Incidental Pulmonary Nodules Detected on CT  Images: From the Fleischner Society 2017; Radiology 2017; [id number]. 2. No acute intrathoracic process. 3. Aortic Atherosclerosis (ICD10-I70.0) and Emphysema (ICD10-J43.9). Electronically Signed   By: Randa Ngo M.D.   On: 03/20/2020 23:42    Assessment & Plan:   Problem List Items Addressed This Visit     Alcohol abuse, in remission     Pt drinks twice a week --2 beers      Anxiety disorder - Primary    Continue on Diazepam prn  Potential benefits of a long term benzodiazepines  use as well as potential risks  and complications were explained to the patient and were aknowledged.  Wife is ill w/COPD on O2      Relevant Orders   TSH   Atherosclerosis of aorta (HCC)    Cont on  Pravastatin      CAD (coronary artery disease)    Cont on  Pravastatin      Relevant Orders   TSH   CBC with Differential/Platelet   Well adult exam   Relevant Orders   TSH   Urinalysis   Lipid panel   CBC with Differential/Platelet   PSA   Comprehensive metabolic panel   Other Visit Diagnoses     Needs flu shot       Relevant Orders   Flu Vaccine QUAD High Dose(Fluad) (Completed)         Follow-up: Return in about 3 months (around 06/17/2021) for Wellness Exam.  Walker Kehr, MD

## 2021-03-18 NOTE — Assessment & Plan Note (Signed)
Pt drinks twice a week --2 beers

## 2021-03-18 NOTE — Assessment & Plan Note (Signed)
Cont on Pravastatin 

## 2021-03-19 ENCOUNTER — Telehealth: Payer: Self-pay | Admitting: Internal Medicine

## 2021-03-19 MED ORDER — MOLNUPIRAVIR EUA 200MG CAPSULE: 4.0000 | ORAL_CAPSULE | Freq: Two times a day (BID) | ORAL | 0 refills | Status: AC

## 2021-03-19 NOTE — Telephone Encounter (Signed)
Patient came in yesterday for fu visit  Patient says he started feeling bad yesterday afternoon after visit  Patient took 3 COVID test & they were all positive  Patient wants to know if he can have antiviral rx sent to pharmacy: [place], [place]  Phone:  [phone] Fax:  [phone]

## 2021-03-19 NOTE — Telephone Encounter (Signed)
[facility] - emailed For a mild COVID-19 case - take zinc 50 mg a day for 1 week, vitamin C 1000 mg daily for 1 week. Call if problems. Isolate for 5 days, then wear a mask for 5 days per CDC. No need for antivirals for mild  illness Thx

## 2021-03-19 NOTE — Telephone Encounter (Signed)
Notified pt w/MD response.Marland KitchenAndee Poles

## 2021-03-27 ENCOUNTER — Telehealth: Payer: Self-pay | Admitting: Internal Medicine

## 2021-03-27 NOTE — Telephone Encounter (Signed)
What meds?  For covid or symptoms of covid?????

## 2021-03-27 NOTE — Telephone Encounter (Signed)
He may still be positive from his first episode and just took the anti-viral so does not need another round of the anti-viral

## 2021-03-27 NOTE — Telephone Encounter (Signed)
Patient recently treated for COVID  Patient has tested COVID+ again 09.23.22  Says meds provider prescribed last time really helped & was wondering if provider could send in rx again to help w/ this second round of [facility]: [place], [place]  Phone:  [phone] Fax:  [phone]

## 2021-03-30 NOTE — Telephone Encounter (Signed)
Called and left vm with Dr. Quay Burow recommendations.

## 2021-03-31 ENCOUNTER — Other Ambulatory Visit: Payer: Self-pay | Admitting: Internal Medicine

## 2021-05-17 ENCOUNTER — Other Ambulatory Visit: Payer: Self-pay | Admitting: Internal Medicine

## 2021-06-03 ENCOUNTER — Other Ambulatory Visit: Payer: Self-pay | Admitting: Internal Medicine

## 2021-06-17 ENCOUNTER — Encounter: Payer: Self-pay | Admitting: Internal Medicine

## 2021-06-17 ENCOUNTER — Other Ambulatory Visit (INDEPENDENT_AMBULATORY_CARE_PROVIDER_SITE_OTHER): Payer: Medicare HMO

## 2021-06-17 ENCOUNTER — Other Ambulatory Visit: Payer: Self-pay

## 2021-06-17 ENCOUNTER — Ambulatory Visit (INDEPENDENT_AMBULATORY_CARE_PROVIDER_SITE_OTHER): Payer: Medicare HMO | Admitting: Internal Medicine

## 2021-06-17 DIAGNOSIS — M1991 Primary osteoarthritis, unspecified site: Secondary | ICD-10-CM

## 2021-06-17 DIAGNOSIS — J449 Chronic obstructive pulmonary disease, unspecified: Secondary | ICD-10-CM | POA: Diagnosis not present

## 2021-06-17 DIAGNOSIS — I2583 Coronary atherosclerosis due to lipid rich plaque: Secondary | ICD-10-CM

## 2021-06-17 DIAGNOSIS — Z125 Encounter for screening for malignant neoplasm of prostate: Secondary | ICD-10-CM

## 2021-06-17 DIAGNOSIS — I2510 Atherosclerotic heart disease of native coronary artery without angina pectoris: Secondary | ICD-10-CM | POA: Diagnosis not present

## 2021-06-17 DIAGNOSIS — Z0000 Encounter for general adult medical examination without abnormal findings: Secondary | ICD-10-CM

## 2021-06-17 DIAGNOSIS — E039 Hypothyroidism, unspecified: Secondary | ICD-10-CM

## 2021-06-17 DIAGNOSIS — R69 Illness, unspecified: Secondary | ICD-10-CM | POA: Diagnosis not present

## 2021-06-17 DIAGNOSIS — F411 Generalized anxiety disorder: Secondary | ICD-10-CM

## 2021-06-17 LAB — COMPREHENSIVE METABOLIC PANEL
ALT: 59 U/L — ABNORMAL HIGH (ref 0–53)
AST: 52 U/L — ABNORMAL HIGH (ref 0–37)
Albumin: 4 g/dL (ref 3.5–5.2)
Alkaline Phosphatase: 47 U/L (ref 39–117)
BUN: 13 mg/dL (ref 6–23)
CO2: 28 mEq/L (ref 19–32)
Calcium: 9 mg/dL (ref 8.4–10.5)
Chloride: 101 mEq/L (ref 96–112)
Creatinine, Ser: 1.02 mg/dL (ref 0.40–1.50)
GFR: 74.56 mL/min (ref >60.00)
Glucose, Bld: 91 mg/dL (ref 70–99)
Potassium: 3.9 mEq/L (ref 3.5–5.1)
Sodium: 137 mEq/L (ref 135–145)
Total Bilirubin: 0.4 mg/dL (ref 0.2–1.2)
Total Protein: 7.9 g/dL (ref 6.0–8.3)

## 2021-06-17 LAB — CBC WITH DIFFERENTIAL/PLATELET
Basophils Absolute: 0 10*3/uL (ref 0.0–0.1)
Basophils Relative: 0.7 % (ref 0.0–3.0)
Eosinophils Absolute: 0.1 10*3/uL (ref 0.0–0.7)
Eosinophils Relative: 1.6 % (ref 0.0–5.0)
HCT: 37 % — ABNORMAL LOW (ref 39.0–52.0)
Hemoglobin: 12.4 g/dL — ABNORMAL LOW (ref 13.0–17.0)
Lymphocytes Relative: 29.6 % (ref 12.0–46.0)
Lymphs Abs: 1.5 10*3/uL (ref 0.7–4.0)
MCHC: 33.4 g/dL (ref 30.0–36.0)
MCV: 79.1 fl (ref 78.0–100.0)
Monocytes Absolute: 0.5 10*3/uL (ref 0.1–1.0)
Monocytes Relative: 9.6 % (ref 3.0–12.0)
Neutro Abs: 2.9 10*3/uL (ref 1.4–7.7)
Neutrophils Relative %: 58.5 % (ref 43.0–77.0)
Platelets: 169 10*3/uL (ref 150.0–400.0)
RBC: 4.68 Mil/uL (ref 4.22–5.81)
RDW: 17.9 % — ABNORMAL HIGH (ref 11.5–15.5)
WBC: 4.9 10*3/uL (ref 4.0–10.5)

## 2021-06-17 LAB — URINALYSIS
Bilirubin Urine: NEGATIVE
Hgb urine dipstick: NEGATIVE
Ketones, ur: NEGATIVE
Leukocytes,Ua: NEGATIVE
Nitrite: NEGATIVE
Specific Gravity, Urine: 1.015 (ref 1.000–1.030)
Total Protein, Urine: NEGATIVE
Urine Glucose: NEGATIVE
Urobilinogen, UA: 0.2 (ref 0.0–1.0)
pH: 6 (ref 5.0–8.0)

## 2021-06-17 LAB — LIPID PANEL
Cholesterol: 184 mg/dL (ref 0–200)
HDL: 37.5 mg/dL — ABNORMAL LOW (ref >39.00)
Total CHOL/HDL Ratio: 5
Triglycerides: 598 mg/dL — ABNORMAL HIGH (ref 0.0–149.0)

## 2021-06-17 LAB — PSA: PSA: 1.96 ng/mL (ref 0.10–4.00)

## 2021-06-17 LAB — LDL CHOLESTEROL, DIRECT: Direct LDL: 37 mg/dL

## 2021-06-17 LAB — TSH: TSH: 4.44 u[IU]/mL (ref 0.35–5.50)

## 2021-06-17 NOTE — Assessment & Plan Note (Signed)
B hands, LS spine

## 2021-06-17 NOTE — Assessment & Plan Note (Signed)
Cont on Levothroid 

## 2021-06-17 NOTE — Progress Notes (Signed)
Subjective:  Patient ID: [name], male    DOB: [birth year]  Age: 70 y.o. MRN: [id number]  CC: Follow-up (3 month f/u)   HPI [name] presents for anxiety, hypothyroidism, COPD  Outpatient Medications Prior to Visit  Medication Sig Dispense Refill   aspirin 81 MG tablet Take 81 mg by mouth daily.     cholecalciferol (VITAMIN D) 1000 UNITS tablet Take 2,000 Units by mouth daily.     diazepam (VALIUM) 10 MG tablet TAKE 1/2 TO 1 (ONE-HALF TO ONE) TABLET BY MOUTH TWICE DAILY AS NEEDED FOR ANXIETY 60 tablet 3   ezetimibe (ZETIA) 10 MG tablet Take 1 tablet by mouth once daily 90 tablet 3   levothyroxine (SYNTHROID) 88 MCG tablet TAKE 1 TABLET BY MOUTH ONCE DAILY BEFORE BREAKFAST 90 tablet 0   losartan (COZAAR) 100 MG tablet Take 1 tablet by mouth once daily 90 tablet 0   nitroGLYCERIN (NITROSTAT) 0.4 MG SL tablet DISSOLVE ONE TABLET UNDER THE TONGUE EVERY 5 MINUTES AS NEEDED FOR CHEST PAIN.  DO NOT EXCEED A TOTAL OF 3 DOSES IN 15 MINUTES 25 tablet 0   pantoprazole (PROTONIX) 40 MG tablet Take 1 tablet (40 mg total) by mouth daily. 90 tablet 3   pravastatin (PRAVACHOL) 20 MG tablet Take 1 tablet (20 mg total) by mouth daily. 90 tablet 3   predniSONE (DELTASONE) 20 MG tablet Take 1 tablet (20 mg total) by mouth daily with breakfast. 5 tablet 0   No facility-administered medications prior to visit.    ROS: Review of Systems  Constitutional:  Negative for appetite change, fatigue and unexpected weight change.  HENT:  Negative for congestion, nosebleeds, sneezing, sore throat and trouble swallowing.   Eyes:  Negative for itching and visual disturbance.  Respiratory:  Negative for cough.   Cardiovascular:  Negative for chest pain, palpitations and leg swelling.  Gastrointestinal:  Negative for abdominal distention, blood in stool, diarrhea and nausea.  Genitourinary:  Negative for frequency and hematuria.  Musculoskeletal:  Negative for back pain, gait problem, joint swelling  and neck pain.  Skin:  Negative for rash.  Neurological:  Negative for dizziness, tremors, speech difficulty and weakness.  Psychiatric/Behavioral:  Negative for agitation, dysphoric mood and sleep disturbance. The patient is nervous/anxious.    Objective:  BP 110/62 (BP Location: Left Arm)    Pulse 65    Temp 98.9 F (37.2 C) (Oral)    Ht 5' 8.25" (1.734 m)    Wt 189 lb 3.2 oz (85.8 kg)    SpO2 96%    BMI 28.56 kg/m   BP Readings from Last 3 Encounters:  06/17/21 110/62  03/18/21 138/72  12/15/20 (!) 110/58    Wt Readings from Last 3 Encounters:  06/17/21 189 lb 3.2 oz (85.8 kg)  03/18/21 191 lb 6.4 oz (86.8 kg)  12/15/20 193 lb 6.4 oz (87.7 kg)    Physical Exam Constitutional:      General: He is not in acute distress.    Appearance: He is well-developed.     Comments: NAD  Eyes:     Conjunctiva/sclera: Conjunctivae normal.     Pupils: Pupils are equal, round, and reactive to light.  Neck:     Thyroid: No thyromegaly.     Vascular: No JVD.  Cardiovascular:     Rate and Rhythm: Normal rate and regular rhythm.     Heart sounds: Normal heart sounds. No murmur heard.   No friction rub. No gallop.  Pulmonary:  Effort: Pulmonary effort is normal. No respiratory distress.     Breath sounds: Normal breath sounds. No wheezing or rales.  Chest:     Chest wall: No tenderness.  Abdominal:     General: Bowel sounds are normal. There is no distension.     Palpations: Abdomen is soft. There is no mass.     Tenderness: There is no abdominal tenderness. There is no guarding or rebound.  Musculoskeletal:        General: No tenderness. Normal range of motion.     Cervical back: Normal range of motion.  Lymphadenopathy:     Cervical: No cervical adenopathy.  Skin:    General: Skin is warm and dry.     Findings: No rash.  Neurological:     Mental Status: He is alert and oriented to person, place, and time.     Cranial Nerves: No cranial nerve deficit.     Motor: No abnormal  muscle tone.     Coordination: Coordination normal.     Gait: Gait normal.     Deep Tendon Reflexes: Reflexes are normal and symmetric.  Psychiatric:        Behavior: Behavior normal.        Thought Content: Thought content normal.        Judgment: Judgment normal.    Lab Results  Component Value Date   WBC 5.6 06/11/2020   HGB 13.2 06/11/2020   HCT 39.7 06/11/2020   PLT 195.0 06/11/2020   GLUCOSE 89 09/10/2020   CHOL 173 06/11/2020   TRIG 421.0 (H) 06/11/2020   HDL 44.50 06/11/2020   LDLDIRECT 45.0 06/11/2020   LDLCALC 59 03/01/2012   ALT 39 09/10/2020   AST 36 09/10/2020   NA 136 09/10/2020   K 4.4 09/10/2020   CL 102 09/10/2020   CREATININE 0.91 09/10/2020   BUN 10 09/10/2020   CO2 26 09/10/2020   TSH 2.23 09/10/2020   PSA 1.72 06/11/2020    CT Chest Wo Contrast  Result Date: 03/20/2020 CLINICAL DATA:  COPD, previous tobacco abuse, pulmonary nodule EXAM: CT CHEST WITHOUT CONTRAST TECHNIQUE: Multidetector CT imaging of the chest was performed following the standard protocol without IV contrast. COMPARISON:  11/30/2017 FINDINGS: Cardiovascular: Unenhanced imaging of the heart and great vessels demonstrates no pericardial effusion. 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 Mild atherosclerosis of the aortic arch. Normal caliber of the thoracic aorta. Mediastinum/Nodes: No enlarged mediastinal or axillary lymph nodes. Thyroid gland, trachea, and esophagus demonstrate no significant findings. Lungs/Pleura: Diffuse upper lobe predominant emphysema. No acute airspace disease, effusion, or pneumothorax. Multiple small noncalcified pulmonary nodules are again identified, unchanged since prior exam. Index nodules are as follows: Right upper lobe, image 31, 6 mm.  Stable. Left lower lobe, image 116, 4 mm.  Stable. A right middle lobe 3 mm nodule seen previously has calcified in the interim, consistent with granuloma. No new nodules or masses. Central airways are  patent. Upper Abdomen: No acute abnormality. Musculoskeletal: No acute or destructive bony lesions. Reconstructed images demonstrate no additional findings. IMPRESSION: 1. Stable subcentimeter pulmonary nodules as above, largest measuring 6 mm. Given the greater than 2 year stability, no further imaging workup is required. This recommendation follows the consensus statement: Guidelines for Management of Incidental Pulmonary Nodules Detected on CT Images: From the Fleischner Society 2017; Radiology 2017; 284:228-243. 2. No acute intrathoracic process. 3. Aortic Atherosclerosis (ICD10-I70.0) and Emphysema (ICD10-J43.9). Electronically Signed   By: Randa Ngo M.D.   On: 03/20/2020 23:42  Assessment & Plan:   Problem List Items Addressed This Visit     Anxiety disorder    Continue on Diazepam prn  Potential benefits of a long term benzodiazepines  use as well as potential risks  and complications were explained to the patient and were aknowledged. Not using Rx w/alcohol      CAD (coronary artery disease)    Cont on  Pravastatin      COPD (chronic obstructive pulmonary disease) (HCC)     Anoro  - using it inconsistently and seldom      Hypothyroidism    Cont on Levothroid      Osteoarthritis    B hands, LS spine         No orders of the defined types were placed in this encounter.     Follow-up: Return in about 3 months (around 09/15/2021) for a follow-up visit.  Walker Kehr, MD

## 2021-06-17 NOTE — Assessment & Plan Note (Signed)
Continue on Diazepam prn  Potential benefits of a long term benzodiazepines  use as well as potential risks  and complications were explained to the patient and were aknowledged. Not using Rx w/alcohol 

## 2021-06-17 NOTE — Assessment & Plan Note (Signed)
Cont on Pravastatin 

## 2021-06-17 NOTE — Assessment & Plan Note (Signed)
Anoro  - using it inconsistently and seldom

## 2021-06-21 ENCOUNTER — Other Ambulatory Visit: Payer: Self-pay | Admitting: Internal Medicine

## 2021-06-21 DIAGNOSIS — I2583 Coronary atherosclerosis due to lipid rich plaque: Secondary | ICD-10-CM

## 2021-06-21 MED ORDER — FISH OIL 1000 MG PO CAPS: 2.0000 | ORAL_CAPSULE | Freq: Every day | ORAL | 3 refills | Status: AC

## 2021-06-22 ENCOUNTER — Encounter: Payer: Self-pay | Admitting: Internal Medicine

## 2021-07-15 ENCOUNTER — Telehealth: Payer: Self-pay | Admitting: Internal Medicine

## 2021-07-15 NOTE — Telephone Encounter (Signed)
N/A unable to leave a message for patient to call me back at [phone] to schedule Medicare Annual Wellness Visit   Last AWV  03/03/20  Please schedule at anytime with [facility] if patient calls the office back.    40 Minutes appointment   Any questions, please call me at [phone]

## 2021-07-20 ENCOUNTER — Other Ambulatory Visit: Payer: Self-pay

## 2021-07-20 ENCOUNTER — Ambulatory Visit (INDEPENDENT_AMBULATORY_CARE_PROVIDER_SITE_OTHER): Payer: Medicare HMO

## 2021-07-20 DIAGNOSIS — Z0000 Encounter for general adult medical examination without abnormal findings: Secondary | ICD-10-CM

## 2021-07-20 NOTE — Progress Notes (Addendum)
I connected with [name] today by telephone and verified that I am speaking with the correct person using two identifiers. Location patient: home Location provider: work Persons participating in the virtual visit: patient, provider.   I discussed the limitations, risks, security and privacy concerns of performing an evaluation and management service by telephone and the availability of in person appointments. I also discussed with the patient that there may be a patient responsible charge related to this service. The patient expressed understanding and verbally consented to this telephonic visit.    Interactive audio and video telecommunications were attempted between this provider and patient, however failed, due to patient having technical difficulties OR patient did not have access to video capability.  We continued and completed visit with audio only.  Some vital signs may be absent or patient reported.   Time Spent with patient on telephone encounter: 40 minutes  Subjective:   [name] is a 71 y.o. male who presents for Medicare Annual/Subsequent preventive examination.  Review of Systems     Cardiac Risk Factors include: advanced age (>28men, >62 women);family history of premature cardiovascular disease;hypertension;dyslipidemia;male gender     Objective:    There were no vitals filed for this visit. There is no height or weight on file to calculate BMI.  Advanced Directives 07/20/2021 03/03/2020 11/30/2017 11/30/2017 11/08/2017  Does Patient Have a Medical Advance Directive? No No No No No  Would patient like information on creating a medical advance directive? No - Patient declined No - Patient declined No - Patient declined Yes (ED - Information included in AVS) -    Current Medications (verified) Outpatient Encounter Medications as of 07/20/2021  Medication Sig   aspirin 81 MG tablet Take 81 mg by mouth daily.   cholecalciferol (VITAMIN D) 1000 UNITS tablet Take  2,000 Units by mouth daily.   diazepam (VALIUM) 10 MG tablet TAKE 1/2 TO 1 (ONE-HALF TO ONE) TABLET BY MOUTH TWICE DAILY AS NEEDED FOR ANXIETY   ezetimibe (ZETIA) 10 MG tablet Take 1 tablet by mouth once daily   levothyroxine (SYNTHROID) 88 MCG tablet TAKE 1 TABLET BY MOUTH ONCE DAILY BEFORE BREAKFAST   losartan (COZAAR) 100 MG tablet Take 1 tablet by mouth once daily   nitroGLYCERIN (NITROSTAT) 0.4 MG SL tablet DISSOLVE ONE TABLET UNDER THE TONGUE EVERY 5 MINUTES AS NEEDED FOR CHEST PAIN.  DO NOT EXCEED A TOTAL OF 3 DOSES IN 15 MINUTES   Omega-3 Fatty Acids (FISH OIL) 1000 MG CAPS Take 2 capsules (2,000 mg total) by mouth daily.   pantoprazole (PROTONIX) 40 MG tablet Take 1 tablet (40 mg total) by mouth daily.   pravastatin (PRAVACHOL) 20 MG tablet Take 1 tablet (20 mg total) by mouth daily.   predniSONE (DELTASONE) 20 MG tablet Take 1 tablet (20 mg total) by mouth daily with breakfast.   No facility-administered encounter medications on file as of 07/20/2021.    Allergies (verified) Atorvastatin, Bee venom, Crestor [rosuvastatin calcium], and Statins   History: Past Medical History:  Diagnosis Date   Allergy    Allergy to bee sting    Anxiety    CAD (coronary artery disease)    statin intolerant   Cyst    Depression    Dermatitis due to sun    ED (erectile dysfunction)    Hyperlipidemia    Hypertension    MI (myocardial infarction) ([place]) 2002   OA (osteoarthritis)    Tobacco abuse    Vitamin D deficiency 2008  Past Surgical History:  Procedure Laterality Date   broken jaw     1970-80's    cardiac catherization  07-14-01   COLONOSCOPY  2013   ESOPHAGOGASTRODUODENOSCOPY  10-06-01   stress cardiolite  06-30-01   UPPER GASTROINTESTINAL ENDOSCOPY     Family History  Problem Relation Age of Onset   Cancer Brother        lymphoma   Heart disease Father    Colon cancer Neg Hx    Esophageal cancer Neg Hx    Rectal cancer Neg Hx    Stomach cancer Neg Hx    Colon polyps Neg  Hx    Social History   Socioeconomic History   Marital status: Divorced    Spouse name: Not on file   Number of children: Not on file   Years of education: 9 years   Highest education level: 9th grade  Occupational History   Occupation: Retired    Comment: 12/2012  Tobacco Use   Smoking status: Former    Packs/day: 2.00    Types: Cigarettes    Quit date: 11/02/2012    Years since quitting: 8.7   Smokeless tobacco: Never  Vaping Use   Vaping Use: Never used  Substance and Sexual Activity   Alcohol use: Not Currently    Alcohol/week: 12.0 standard drinks    Types: 12 Cans of beer per week    Comment: Quit 07/06/2021   Drug use: Yes    Types: Marijuana    Comment: marijuana   Sexual activity: Yes  Other Topics Concern   Not on file  Social History Narrative   Regular exercise-yes (5 days, 30 minutes)   Lives with girlfriend.   Quit drinking 07/06/2021   Social Determinants of Health   Financial Resource Strain: Low Risk    Difficulty of Paying Living Expenses: Not hard at all  Food Insecurity: No Food Insecurity   Worried About Charity fundraiser in the Last Year: Never true   Arboriculturist in the Last Year: Never true  Transportation Needs: No Transportation Needs   Lack of Transportation (Medical): No   Lack of Transportation (Non-Medical): No  Physical Activity: Sufficiently Active   Days of Exercise per Week: 5 days   Minutes of Exercise per Session: 30 min  Stress: No Stress Concern Present   Feeling of Stress : Not at all  Social Connections: Unknown   Frequency of Communication with Friends and Family: More than three times a week   Frequency of Social Gatherings with Friends and Family: Once a week   Attends Religious Services: Patient refused   Marine scientist or Organizations: Patient refused   Attends Music therapist: Patient refused   Marital Status: Living with partner    Tobacco Counseling Counseling given: Not  Answered   Clinical Intake:  Pre-visit preparation completed: Yes  Pain : No/denies pain     Nutritional Risks: None Diabetes: No  How often do you need to have someone help you when you read instructions, pamphlets, or other written materials from your doctor or pharmacy?: 1 - Never What is the last grade level you completed in school?: 9th grade  Diabetic? no  Interpreter Needed?: No  Information entered by :: Lisette Abu, LPN   Activities of Daily Living In your present state of health, do you have any difficulty performing the following activities: 07/20/2021  Hearing? Y  Comment wears hearing aids  Vision? N  Difficulty concentrating or  making decisions? N  Walking or climbing stairs? N  Dressing or bathing? N  Doing errands, shopping? N  Preparing Food and eating ? N  Using the Toilet? N  In the past six months, have you accidently leaked urine? N  Do you have problems with loss of bowel control? N  Managing your Medications? N  Managing your Finances? N  Housekeeping or managing your Housekeeping? N  Some recent data might be hidden    Patient Care Team: Plotnikov, Evie Lacks, MD as PCP - General  Indicate any recent Medical Services you may have received from other than [facility] providers in the past year (date may be approximate).     Assessment:   This is a routine wellness examination for [name].  Hearing/Vision screen Hearing Screening - Comments:: Patient wears hearing aids. Vision Screening - Comments:: Patient wears eyeglasses.  Dietary issues and exercise activities discussed: Current Exercise Habits: Home exercise routine, Type of exercise: walking, Time (Minutes): 30, Frequency (Times/Week): 5, Weekly Exercise (Minutes/Week): 150, Intensity: Moderate, Exercise limited by: orthopedic condition(s);respiratory conditions(s)   Goals Addressed               This Visit's Progress     Patient Stated (pt-stated)        Well one of my goals  is that I quit drinking 07/06/2021 and I plan to increase my physical activity and watch my carbohydrate intake.      Depression Screen PHQ 2/9 Scores 07/20/2021 06/17/2021 06/11/2020 03/03/2020 03/03/2020 09/04/2019 08/24/2018  PHQ - 2 Score 0 0 0 0 0 0 0  PHQ- 9 Score 0 0 - - - - -    Fall Risk Fall Risk  07/20/2021 06/17/2021 06/11/2020 03/03/2020 09/04/2019  Falls in the past year? 0 0 0 0 0  Number falls in past yr: 0 0 0 0 -  Injury with Fall? 0 0 0 0 -  Risk for fall due to : No Fall Risks No Fall Risks - No Fall Risks -  Follow up - - - Falls evaluation completed;Education provided Falls evaluation completed    FALL RISK PREVENTION PERTAINING TO THE HOME:  Any stairs in or around the home? No  If so, are there any without handrails? No  Home free of loose throw rugs in walkways, pet beds, electrical cords, etc? Yes  Adequate lighting in your home to reduce risk of falls? Yes   ASSISTIVE DEVICES UTILIZED TO PREVENT FALLS:  Life alert? No  Use of a cane, walker or w/c? No  Grab bars in the bathroom? No  Shower chair or bench in shower? No  Elevated toilet seat or a handicapped toilet? No   TIMED UP AND GO:  Was the test performed? No .  Length of time to ambulate 10 feet: n/a sec.   Gait steady and fast without use of assistive device  Cognitive Function: Normal cognitive status assessed by direct observation by this Nurse Health Advisor. No abnormalities found.          Immunizations Immunization History  Administered Date(s) Administered   Fluad Quad(high Dose 65+) 02/22/2019, 03/18/2021   Influenza Whole 07/07/2009, 03/03/2010   Influenza, Quadrivalent, Recombinant, Inj, Pf 02/21/2018   Influenza,inj,Quad PF,6+ Mos 03/18/2015   Influenza-Unspecified 03/05/2016, 04/22/2017, 03/28/2020   PFIZER(Purple Top)SARS-COV-2 Vaccination 08/11/2019, 09/01/2019, 04/17/2020, 10/20/2020   Pneumococcal Conjugate-13 08/08/2013, 09/05/2016   Pneumococcal Polysaccharide-23 03/08/2012,  08/10/2017   Td 06/01/2010   Zoster Recombinat (Shingrix) 08/10/2017, 10/21/2017    TDAP status: Due, Education  has been provided regarding the importance of this vaccine. Advised may receive this vaccine at local pharmacy or Health Dept. Aware to provide a copy of the vaccination record if obtained from local pharmacy or Health Dept. Verbalized acceptance and understanding.  Flu Vaccine status: Up to date  Pneumococcal vaccine status: Up to date  Covid-19 vaccine status: Completed vaccines  Qualifies for Shingles Vaccine? Yes   Zostavax completed No   Shingrix Completed?: Yes  Screening Tests Health Maintenance  Topic Date Due   TETANUS/TDAP  06/01/2020   COVID-19 Vaccine (5 - Booster for Pfizer series) 12/15/2020   COLONOSCOPY (Pts 45-74yrs Insurance coverage will need to be confirmed)  04/05/2023   Pneumonia Vaccine 89+ Years old  Completed   INFLUENZA VACCINE  Completed   Hepatitis C Screening  Completed   Zoster Vaccines- Shingrix  Completed   HPV VACCINES  Aged Out    Health Maintenance  Health Maintenance Due  Topic Date Due   TETANUS/TDAP  06/01/2020   COVID-19 Vaccine (5 - Booster for Pfizer series) 12/15/2020    Colorectal cancer screening: Type of screening: Colonoscopy. Completed 04/04/2018. Repeat every 5 years  Lung Cancer Screening: (Low Dose CT Chest recommended if Age 50-80 years, 30 pack-year currently smoking OR have quit w/in 15years.) does qualify.   Lung Cancer Screening Referral: no  Additional Screening:  Hepatitis C Screening: does qualify; Completed yes  Vision Screening: Recommended annual ophthalmology exams for early detection of glaucoma and other disorders of the eye. Is the patient up to date with their annual eye exam?  No  Who is the provider or what is the name of the office in which the patient attends annual eye exams? Patient stated his vision was fine. If pt is not established with a provider, would they like to be referred to a  provider to establish care? No .   Dental Screening: Recommended annual dental exams for proper oral hygiene  Community Resource Referral / Chronic Care Management: CRR required this visit?  No   CCM required this visit?  No      Plan:     I have personally reviewed and noted the following in the patients chart:   Medical and social history Use of alcohol, tobacco or illicit drugs  Current medications and supplements including opioid prescriptions. Patient is not currently taking opioid prescriptions. Functional ability and status Nutritional status Physical activity Advanced directives List of other physicians Hospitalizations, surgeries, and ER visits in previous 12 months Vitals Screenings to include cognitive, depression, and falls Referrals and appointments  In addition, I have reviewed and discussed with patient certain preventive protocols, quality metrics, and best practice recommendations. A written personalized care plan for preventive services as well as general preventive health recommendations were provided to patient.     Sheral Flow, LPN   09/17/1759   Nurse Notes:  Patient is cogitatively intact. There were no vitals filed for this visit. There is no height or weight on file to calculate BMI.  Medical screening examination/treatment/procedure(s) were performed by non-physician practitioner and as supervising physician I was immediately available for consultation/collaboration.  I agree with above. Lew Dawes, MD

## 2021-08-11 ENCOUNTER — Other Ambulatory Visit: Payer: Self-pay | Admitting: Internal Medicine

## 2021-08-18 ENCOUNTER — Telehealth: Payer: Self-pay | Admitting: Internal Medicine

## 2021-08-18 ENCOUNTER — Other Ambulatory Visit: Payer: Self-pay

## 2021-08-18 ENCOUNTER — Encounter: Payer: Self-pay | Admitting: Internal Medicine

## 2021-08-18 ENCOUNTER — Ambulatory Visit (INDEPENDENT_AMBULATORY_CARE_PROVIDER_SITE_OTHER): Payer: Medicare HMO | Admitting: Internal Medicine

## 2021-08-18 DIAGNOSIS — K219 Gastro-esophageal reflux disease without esophagitis: Secondary | ICD-10-CM | POA: Diagnosis not present

## 2021-08-18 DIAGNOSIS — H60312 Diffuse otitis externa, left ear: Secondary | ICD-10-CM | POA: Diagnosis not present

## 2021-08-18 DIAGNOSIS — F411 Generalized anxiety disorder: Secondary | ICD-10-CM

## 2021-08-18 DIAGNOSIS — R69 Illness, unspecified: Secondary | ICD-10-CM | POA: Diagnosis not present

## 2021-08-18 DIAGNOSIS — H6090 Unspecified otitis externa, unspecified ear: Secondary | ICD-10-CM | POA: Insufficient documentation

## 2021-08-18 MED ORDER — PRAVASTATIN SODIUM 20 MG PO TABS: 20.0000 mg | ORAL_TABLET | Freq: Every day | ORAL | 3 refills | Status: DC

## 2021-08-18 MED ORDER — LEVOTHYROXINE SODIUM 88 MCG PO TABS: 88.0000 ug | ORAL_TABLET | Freq: Every day | ORAL | 3 refills | Status: DC

## 2021-08-18 MED ORDER — HYDROCODONE-ACETAMINOPHEN 5-325 MG PO TABS: 1.0000 | ORAL_TABLET | Freq: Four times a day (QID) | ORAL | 0 refills | Status: DC | PRN

## 2021-08-18 MED ORDER — DIAZEPAM 10 MG PO TABS: ORAL_TABLET | ORAL | 3 refills | Status: DC

## 2021-08-18 MED ORDER — PANTOPRAZOLE SODIUM 40 MG PO TBEC: 40.0000 mg | DELAYED_RELEASE_TABLET | Freq: Every day | ORAL | 3 refills | Status: DC | PRN

## 2021-08-18 MED ORDER — LOSARTAN POTASSIUM 100 MG PO TABS
100.0000 mg | ORAL_TABLET | Freq: Every day | ORAL | 3 refills | Status: DC
Start: 2021-08-18 — End: 2021-12-17

## 2021-08-18 MED ORDER — CIPROFLOXACIN HCL 500 MG PO TABS: 500.0000 mg | ORAL_TABLET | Freq: Two times a day (BID) | ORAL | 0 refills | Status: AC

## 2021-08-18 MED ORDER — CIPROFLOXACIN HCL 500 MG PO TABS: 500.0000 mg | ORAL_TABLET | Freq: Two times a day (BID) | ORAL | 0 refills | Status: DC

## 2021-08-18 MED ORDER — NEOMYCIN-POLYMYXIN-HC 3.5-10000-1 OT SOLN: 3.0000 [drp] | Freq: Three times a day (TID) | OTIC | 3 refills | Status: AC

## 2021-08-18 NOTE — Assessment & Plan Note (Signed)
Continue on Diazepam prn  Potential benefits of a long term benzodiazepines  use as well as potential risks  and complications were explained to the patient and were aknowledged. Not using Rx w/alcohol 

## 2021-08-18 NOTE — Addendum Note (Signed)
Addended by: Cassandria Anger on: 08/18/2021 09:28 AM   Modules accepted: Orders

## 2021-08-18 NOTE — Assessment & Plan Note (Signed)
2/23 L ear Cipro po x 5 d Cortisporin otic Norco prn

## 2021-08-18 NOTE — Assessment & Plan Note (Signed)
Using Protonix prn 

## 2021-08-18 NOTE — Progress Notes (Addendum)
Subjective:  Patient ID: [name], male    DOB: 1951/04/15  Age: 71 y.o. MRN: [id number]  CC: No chief complaint on file.   HPI [name] presents for L ear pain x 1 week - worse; severe F/u on anxiety No ETOH  Outpatient Medications Prior to Visit  Medication Sig Dispense Refill   aspirin 81 MG tablet Take 81 mg by mouth daily.     cholecalciferol (VITAMIN D) 1000 UNITS tablet Take 2,000 Units by mouth daily.     ezetimibe (ZETIA) 10 MG tablet Take 1 tablet by mouth once daily 49 tablet 0   nitroGLYCERIN (NITROSTAT) 0.4 MG SL tablet DISSOLVE ONE TABLET UNDER THE TONGUE EVERY 5 MINUTES AS NEEDED FOR CHEST PAIN.  DO NOT EXCEED A TOTAL OF 3 DOSES IN 15 MINUTES 25 tablet 0   Omega-3 Fatty Acids (FISH OIL) 1000 MG CAPS Take 2 capsules (2,000 mg total) by mouth daily. 100 capsule 3   predniSONE (DELTASONE) 20 MG tablet Take 1 tablet (20 mg total) by mouth daily with breakfast. 5 tablet 0   diazepam (VALIUM) 10 MG tablet TAKE 1/2 TO 1 (ONE-HALF TO ONE) TABLET BY MOUTH TWICE DAILY AS NEEDED FOR ANXIETY 60 tablet 3   levothyroxine (SYNTHROID) 88 MCG tablet TAKE 1 TABLET BY MOUTH ONCE DAILY BEFORE BREAKFAST 90 tablet 0   losartan (COZAAR) 100 MG tablet Take 1 tablet by mouth once daily 90 tablet 0   pantoprazole (PROTONIX) 40 MG tablet Take 1 tablet (40 mg total) by mouth daily. 90 tablet 3   pravastatin (PRAVACHOL) 20 MG tablet Take 1 tablet by mouth once daily 90 tablet 0   No facility-administered medications prior to visit.    ROS: Review of Systems  Constitutional:  Negative for appetite change, fatigue, fever and unexpected weight change.  HENT:  Positive for ear pain. Negative for congestion, ear discharge, facial swelling, nosebleeds, sneezing, sore throat, tinnitus and trouble swallowing.   Eyes:  Negative for itching and visual disturbance.  Respiratory:  Negative for cough.   Cardiovascular:  Negative for chest pain, palpitations and leg swelling.   Gastrointestinal:  Negative for abdominal distention, blood in stool, diarrhea and nausea.  Genitourinary:  Negative for frequency and hematuria.  Musculoskeletal:  Negative for back pain, gait problem, joint swelling and neck pain.  Skin:  Negative for rash.  Neurological:  Negative for dizziness, tremors, speech difficulty and weakness.  Psychiatric/Behavioral:  Negative for agitation, dysphoric mood and sleep disturbance. The patient is not nervous/anxious.    Objective:  BP (!) 102/58 (BP Location: Left Arm, Patient Position: Sitting, Cuff Size: Large)    Pulse 67    Temp 98.5 F (36.9 C) (Oral)    Ht 5\' 8"  (1.727 m)    Wt 188 lb (85.3 kg)    SpO2 96%    BMI 28.59 kg/m   BP Readings from Last 3 Encounters:  08/18/21 (!) 102/58  06/17/21 110/62  03/18/21 138/72    Wt Readings from Last 3 Encounters:  08/18/21 188 lb (85.3 kg)  06/17/21 189 lb 3.2 oz (85.8 kg)  03/18/21 191 lb 6.4 oz (86.8 kg)    Physical Exam Constitutional:      Appearance: Normal appearance. He is not ill-appearing.  HENT:     Right Ear: Ear canal and external ear normal. There is no impacted cerumen.     Nose: No congestion.  L ear - pinfull Ear canal is swollen shut  Lab Results  Component Value Date   WBC 4.9 06/17/2021   HGB 12.4 (L) 06/17/2021   HCT 37.0 (L) 06/17/2021   PLT 169.0 06/17/2021   GLUCOSE 91 06/17/2021   CHOL 184 06/17/2021   TRIG (H) 06/17/2021    598.0 Triglyceride is over 400; calculations on Lipids are invalid.   HDL 37.50 (L) 06/17/2021   LDLDIRECT 37.0 06/17/2021   LDLCALC 59 03/01/2012   ALT 59 (H) 06/17/2021   AST 52 (H) 06/17/2021   NA 137 06/17/2021   K 3.9 06/17/2021   CL 101 06/17/2021   CREATININE 1.02 06/17/2021   BUN 13 06/17/2021   CO2 28 06/17/2021   TSH 4.44 06/17/2021   PSA 1.96 06/17/2021    CT Chest Wo Contrast  Result Date: 03/20/2020 CLINICAL DATA:  COPD, previous tobacco abuse, pulmonary nodule EXAM: CT CHEST WITHOUT CONTRAST TECHNIQUE:  Multidetector CT imaging of the chest was performed following the standard protocol without IV contrast. COMPARISON:  11/30/2017 FINDINGS: Cardiovascular: Unenhanced imaging of the heart and great vessels demonstrates no pericardial effusion. 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 Mild atherosclerosis of the aortic arch. Normal caliber of the thoracic aorta. Mediastinum/Nodes: No enlarged mediastinal or axillary lymph nodes. Thyroid gland, trachea, and esophagus demonstrate no significant findings. Lungs/Pleura: Diffuse upper lobe predominant emphysema. No acute airspace disease, effusion, or pneumothorax. Multiple small noncalcified pulmonary nodules are again identified, unchanged since prior exam. Index nodules are as follows: Right upper lobe, image 31, 6 mm.  Stable. Left lower lobe, image 116, 4 mm.  Stable. A right middle lobe 3 mm nodule seen previously has calcified in the interim, consistent with granuloma. No new nodules or masses. Central airways are patent. Upper Abdomen: No acute abnormality. Musculoskeletal: No acute or destructive bony lesions. Reconstructed images demonstrate no additional findings. IMPRESSION: 1. Stable subcentimeter pulmonary nodules as above, largest measuring 6 mm. Given the greater than 2 year stability, no further imaging workup is required. This recommendation follows the consensus statement: Guidelines for Management of Incidental Pulmonary Nodules Detected on CT Images: From the Fleischner Society 2017; Radiology 2017; 284:228-243. 2. No acute intrathoracic process. 3. Aortic Atherosclerosis (ICD10-I70.0) and Emphysema (ICD10-J43.9). Electronically Signed   By: Randa Ngo M.D.   On: 03/20/2020 23:42    Assessment & Plan:   Problem List Items Addressed This Visit     Anxiety disorder    Continue on Diazepam prn  Potential benefits of a long term benzodiazepines  use as well as potential risks  and complications were explained to the  patient and were aknowledged. Not using Rx w/alcohol      Relevant Medications   diazepam (VALIUM) 10 MG tablet   GERD (gastroesophageal reflux disease)    Using Protonix prn      Relevant Medications   pantoprazole (PROTONIX) 40 MG tablet   Otitis externa    2/23 L ear Cipro po x 5 d Cortisporin otic Norco prn         Meds ordered this encounter  Medications   levothyroxine (SYNTHROID) 88 MCG tablet    Sig: Take 1 tablet (88 mcg total) by mouth daily before breakfast.    Dispense:  90 tablet    Refill:  3   diazepam (VALIUM) 10 MG tablet    Sig: 1 po bid prn    Dispense:  60 tablet    Refill:  3   losartan (COZAAR) 100 MG tablet    Sig: Take 1 tablet (100 mg total) by mouth daily.  Dispense:  90 tablet    Refill:  3   pravastatin (PRAVACHOL) 20 MG tablet    Sig: Take 1 tablet (20 mg total) by mouth daily.    Dispense:  90 tablet    Refill:  3   ciprofloxacin (CIPRO) 500 MG tablet    Sig: Take 1 tablet (500 mg total) by mouth 2 (two) times daily for 10 days.    Dispense:  20 tablet    Refill:  0    Hold lipitor x 5 days   HYDROcodone-acetaminophen (NORCO) 5-325 MG tablet    Sig: Take 1 tablet by mouth every 6 (six) hours as needed for moderate pain.    Dispense:  30 tablet    Refill:  0   neomycin-polymyxin-hydrocortisone (CORTISPORIN) OTIC solution    Sig: Place 3 drops into both ears 3 (three) times daily.    Dispense:  10 mL    Refill:  3   pantoprazole (PROTONIX) 40 MG tablet    Sig: Take 1 tablet (40 mg total) by mouth daily as needed.    Dispense:  90 tablet    Refill:  3      Follow-up: No follow-ups on file.  Walker Kehr, MD

## 2021-08-18 NOTE — Telephone Encounter (Signed)
Note not needed 

## 2021-09-01 ENCOUNTER — Other Ambulatory Visit: Payer: Self-pay | Admitting: Internal Medicine

## 2021-09-08 DIAGNOSIS — H524 Presbyopia: Secondary | ICD-10-CM | POA: Diagnosis not present

## 2021-09-12 ENCOUNTER — Other Ambulatory Visit: Payer: Self-pay | Admitting: Internal Medicine

## 2021-09-16 ENCOUNTER — Encounter: Payer: Self-pay | Admitting: Internal Medicine

## 2021-09-16 ENCOUNTER — Ambulatory Visit (INDEPENDENT_AMBULATORY_CARE_PROVIDER_SITE_OTHER): Payer: Medicare HMO | Admitting: Internal Medicine

## 2021-09-16 DIAGNOSIS — J449 Chronic obstructive pulmonary disease, unspecified: Secondary | ICD-10-CM

## 2021-09-16 DIAGNOSIS — F411 Generalized anxiety disorder: Secondary | ICD-10-CM | POA: Diagnosis not present

## 2021-09-16 DIAGNOSIS — H60312 Diffuse otitis externa, left ear: Secondary | ICD-10-CM

## 2021-09-16 DIAGNOSIS — R69 Illness, unspecified: Secondary | ICD-10-CM | POA: Diagnosis not present

## 2021-09-16 MED ORDER — CIPROFLOXACIN HCL 500 MG PO TABS: 500.0000 mg | ORAL_TABLET | Freq: Two times a day (BID) | ORAL | 0 refills | Status: DC

## 2021-09-16 NOTE — Assessment & Plan Note (Signed)
R ear ?Using hearing aid alters the ear canal flora; increases risk of both fungal and bacterial otitis externa, as well as encourage wax debris formation, with resultant ear irritations. To ensure compliance their ears should periodically be attended to, by de-waxing or given topical antimicrobial agents where indicated. ?Cipro po ?Cortisporin otic ?

## 2021-09-16 NOTE — Assessment & Plan Note (Signed)
Use Anoro ?

## 2021-09-16 NOTE — Progress Notes (Signed)
? ?Subjective:  ?Patient ID: [name], male    DOB: [birth year]  Age: 70 y.o. MRN: [id number] ? ?CC: No chief complaint on file. ? ? ?HPI ?[name] presents for anxiety, COPD ?C/o R earache x 2 days ? ?Outpatient Medications Prior to Visit  ?Medication Sig Dispense Refill  ? aspirin 81 MG tablet Take 81 mg by mouth daily.    ? cholecalciferol (VITAMIN D) 1000 UNITS tablet Take 2,000 Units by mouth daily.    ? diazepam (VALIUM) 10 MG tablet TAKE 1/2 TO 1 (ONE-HALF TO ONE) TABLET BY MOUTH TWICE DAILY AS NEEDED FOR ANXIETY 60 tablet 3  ? ezetimibe (ZETIA) 10 MG tablet Take 1 tablet by mouth once daily 90 tablet 0  ? HYDROcodone-acetaminophen (NORCO) 5-325 MG tablet Take 1 tablet by mouth every 6 (six) hours as needed for moderate pain. 30 tablet 0  ? levothyroxine (SYNTHROID) 88 MCG tablet Take 1 tablet (88 mcg total) by mouth daily before breakfast. 90 tablet 3  ? losartan (COZAAR) 100 MG tablet Take 1 tablet (100 mg total) by mouth daily. 90 tablet 3  ? neomycin-polymyxin-hydrocortisone (CORTISPORIN) OTIC solution Place 3 drops into both ears 3 (three) times daily. 10 mL 3  ? nitroGLYCERIN (NITROSTAT) 0.4 MG SL tablet DISSOLVE ONE TABLET UNDER THE TONGUE EVERY 5 MINUTES AS NEEDED FOR CHEST PAIN.  DO NOT EXCEED A TOTAL OF 3 DOSES IN 15 MINUTES 25 tablet 0  ? Omega-3 Fatty Acids (FISH OIL) 1000 MG CAPS Take 2 capsules (2,000 mg total) by mouth daily. 100 capsule 3  ? pantoprazole (PROTONIX) 40 MG tablet Take 1 tablet (40 mg total) by mouth daily as needed. 90 tablet 3  ? pravastatin (PRAVACHOL) 20 MG tablet Take 1 tablet (20 mg total) by mouth daily. 90 tablet 3  ? predniSONE (DELTASONE) 20 MG tablet Take 1 tablet (20 mg total) by mouth daily with breakfast. 5 tablet 0  ? ?No facility-administered medications prior to visit.  ? ? ?ROS: ?Review of Systems  ?Constitutional:  Negative for appetite change, fatigue and unexpected weight change.  ?HENT:  Positive for ear discharge and ear pain. Negative for  congestion, nosebleeds, sneezing, sore throat and trouble swallowing.   ?Eyes:  Negative for itching and visual disturbance.  ?Respiratory:  Negative for cough.   ?Cardiovascular:  Negative for chest pain, palpitations and leg swelling.  ?Gastrointestinal:  Negative for abdominal distention, blood in stool, diarrhea and nausea.  ?Genitourinary:  Negative for frequency and hematuria.  ?Musculoskeletal:  Negative for back pain, gait problem, joint swelling and neck pain.  ?Skin:  Negative for rash.  ?Neurological:  Negative for dizziness, tremors, speech difficulty and weakness.  ?Psychiatric/Behavioral:  Negative for agitation, dysphoric mood and sleep disturbance. The patient is not nervous/anxious.   ? ?Objective:  ?BP (!) 100/58 (BP Location: Left Arm, Patient Position: Sitting, Cuff Size: Large)   Pulse 71   Temp 98.7 ?F (37.1 ?C) (Oral)   Ht '5\' 8"'$  (1.727 m)   Wt 187 lb (84.8 kg)   SpO2 97%   BMI 28.43 kg/m?  ? ?BP Readings from Last 3 Encounters:  ?09/16/21 (!) 100/58  ?08/18/21 (!) 102/58  ?06/17/21 110/62  ? ? ?Wt Readings from Last 3 Encounters:  ?09/16/21 187 lb (84.8 kg)  ?08/18/21 188 lb (85.3 kg)  ?06/17/21 189 lb 3.2 oz (85.8 kg)  ? ? ?Physical Exam ?Constitutional:   ?   General: He is not in acute distress. ?   Appearance: He  is well-developed.  ?   Comments: NAD  ?HENT:  ?   Right Ear: There is no impacted cerumen.  ?   Left Ear: Tympanic membrane, ear canal and external ear normal. There is no impacted cerumen.  ?Eyes:  ?   Conjunctiva/sclera: Conjunctivae normal.  ?   Pupils: Pupils are equal, round, and reactive to light.  ?Neck:  ?   Thyroid: No thyromegaly.  ?   Vascular: No JVD.  ?Cardiovascular:  ?   Rate and Rhythm: Normal rate and regular rhythm.  ?   Heart sounds: Normal heart sounds. No murmur heard. ?  No friction rub. No gallop.  ?Pulmonary:  ?   Effort: Pulmonary effort is normal. No respiratory distress.  ?   Breath sounds: Normal breath sounds. No wheezing or rales.  ?Chest:  ?    Chest wall: No tenderness.  ?Abdominal:  ?   General: Bowel sounds are normal. There is no distension.  ?   Palpations: Abdomen is soft. There is no mass.  ?   Tenderness: There is no abdominal tenderness. There is no guarding or rebound.  ?Musculoskeletal:     ?   General: No tenderness. Normal range of motion.  ?   Cervical back: Normal range of motion.  ?Lymphadenopathy:  ?   Cervical: No cervical adenopathy.  ?Skin: ?   General: Skin is warm and dry.  ?   Findings: No rash.  ?Neurological:  ?   Mental Status: He is alert and oriented to person, place, and time.  ?   Cranial Nerves: No cranial nerve deficit.  ?   Motor: No abnormal muscle tone.  ?   Coordination: Coordination normal.  ?   Gait: Gait normal.  ?   Deep Tendon Reflexes: Reflexes are normal and symmetric.  ?Psychiatric:     ?   Behavior: Behavior normal.     ?   Thought Content: Thought content normal.     ?   Judgment: Judgment normal.  ? ?L OM - painfull, closed ?Lab Results  ?Component Value Date  ? WBC 4.9 06/17/2021  ? HGB 12.4 (L) 06/17/2021  ? HCT 37.0 (L) 06/17/2021  ? PLT 169.0 06/17/2021  ? GLUCOSE 91 06/17/2021  ? CHOL 184 06/17/2021  ? TRIG (H) 06/17/2021  ?  598.0 Triglyceride is over 400; calculations on Lipids are invalid.  ? HDL 37.50 (L) 06/17/2021  ? LDLDIRECT 37.0 06/17/2021  ? [place] 59 03/01/2012  ? ALT 59 (H) 06/17/2021  ? AST 52 (H) 06/17/2021  ? NA 137 06/17/2021  ? K 3.9 06/17/2021  ? CL 101 06/17/2021  ? CREATININE 1.02 06/17/2021  ? BUN 13 06/17/2021  ? CO2 28 06/17/2021  ? TSH 4.44 06/17/2021  ? PSA 1.96 06/17/2021  ? ? ?CT Chest Wo Contrast ? ?Result Date: 03/20/2020 ?CLINICAL DATA:  COPD, previous tobacco abuse, pulmonary nodule EXAM: CT CHEST WITHOUT CONTRAST TECHNIQUE: Multidetector CT imaging of the chest was performed following the standard protocol without IV contrast. COMPARISON:  11/30/2017 FINDINGS: Cardiovascular: Unenhanced imaging of the heart and great vessels demonstrates no pericardial effusion. Extensive  atherosclerosis of the coronary vasculature unchanged. Mild atherosclerosis of the aortic arch. Normal caliber of the thoracic aorta. Mediastinum/Nodes: No enlarged mediastinal or axillary lymph nodes. Thyroid gland, trachea, and esophagus demonstrate no significant findings. Lungs/Pleura: Diffuse upper lobe predominant emphysema. No acute airspace disease, effusion, or pneumothorax. Multiple small noncalcified pulmonary nodules are again identified, unchanged since prior exam. Index nodules are as follows: Right  upper lobe, image 31, 6 mm.  Stable. Left lower lobe, image 116, 4 mm.  Stable. A right middle lobe 3 mm nodule seen previously has calcified in the interim, consistent with granuloma. No new nodules or masses. Central airways are patent. Upper Abdomen: No acute abnormality. Musculoskeletal: No acute or destructive bony lesions. Reconstructed images demonstrate no additional findings. IMPRESSION: 1. Stable subcentimeter pulmonary nodules as above, largest measuring 6 mm. Given the greater than 2 year stability, no further imaging workup is required. This recommendation follows the consensus statement: Guidelines for Management of Incidental Pulmonary Nodules Detected on CT Images: From the Fleischner Society 2017; Radiology 2017; 284:228-243. 2. No acute intrathoracic process. 3. Aortic Atherosclerosis (ICD10-I70.0) and Emphysema (ICD10-J43.9). Electronically Signed   By: Randa Ngo M.D.   On: 03/20/2020 23:42  ? ? ?Assessment & Plan:  ? ?Problem List Items Addressed This Visit   ? ? Anxiety disorder  ?  Continue on Diazepam prn ? Potential benefits of a long term benzodiazepines  use as well as potential risks  and complications were explained to the patient and were aknowledged. Not using Rx w/alcohol ?  ?  ? COPD (chronic obstructive pulmonary disease) ([place])  ?   Use Anoro ?  ?  ? Otitis externa  ?  R ear ?Using hearing aid alters the ear canal flora; increases risk of both fungal and bacterial  otitis externa, as well as encourage wax debris formation, with resultant ear irritations. To ensure compliance their ears should periodically be attended to, by de-waxing or given topical antimicrobial agents wher

## 2021-09-16 NOTE — Assessment & Plan Note (Signed)
Continue on Diazepam prn  Potential benefits of a long term benzodiazepines  use as well as potential risks  and complications were explained to the patient and were aknowledged. Not using Rx w/alcohol 

## 2021-09-16 NOTE — Patient Instructions (Addendum)
What should I clean my hearing aids with? ?If you have behind-the-ear hearing aids, clean the hearing aid surface with wet wipes that do not contain alcohol. For in-the-ear hearing aids, all kinds of wet wipes can be used to clean the hearing aids' surface. ? ?Using hearing aid alters the ear canal flora; increases risk of both fungal and bacterial otitis externa, as well as encourage wax debris formation, with resultant ear irritations. To ensure compliance their ears should periodically be attended to, by de-waxing or given topical antimicrobial agents where indicated. ? ? ?Try putting a few drops of baby oil, hydrogen peroxide, mineral oil, or glycerin in your ear to soften the wax. Or you can use an over-the-counter wax removal kit. Besides cotton swabs or any other small or pointy objects, don't use ear candles to clean your ears.Apr 23, 2019 ?

## 2021-12-17 ENCOUNTER — Ambulatory Visit (INDEPENDENT_AMBULATORY_CARE_PROVIDER_SITE_OTHER): Payer: Medicare HMO | Admitting: Internal Medicine

## 2021-12-17 ENCOUNTER — Encounter: Payer: Self-pay | Admitting: Internal Medicine

## 2021-12-17 DIAGNOSIS — I119 Hypertensive heart disease without heart failure: Secondary | ICD-10-CM

## 2021-12-17 DIAGNOSIS — E559 Vitamin D deficiency, unspecified: Secondary | ICD-10-CM | POA: Diagnosis not present

## 2021-12-17 DIAGNOSIS — F411 Generalized anxiety disorder: Secondary | ICD-10-CM | POA: Diagnosis not present

## 2021-12-17 DIAGNOSIS — E039 Hypothyroidism, unspecified: Secondary | ICD-10-CM | POA: Diagnosis not present

## 2021-12-17 DIAGNOSIS — R69 Illness, unspecified: Secondary | ICD-10-CM | POA: Diagnosis not present

## 2021-12-17 MED ORDER — PANTOPRAZOLE SODIUM 40 MG PO TBEC: 40.0000 mg | DELAYED_RELEASE_TABLET | Freq: Every day | ORAL | 3 refills | Status: DC | PRN

## 2021-12-17 MED ORDER — NITROGLYCERIN 0.4 MG SL SUBL: SUBLINGUAL_TABLET | SUBLINGUAL | 2 refills | Status: DC

## 2021-12-17 MED ORDER — DIAZEPAM 10 MG PO TABS: ORAL_TABLET | ORAL | 3 refills | Status: DC

## 2021-12-17 MED ORDER — PRAVASTATIN SODIUM 20 MG PO TABS: 20.0000 mg | ORAL_TABLET | Freq: Every day | ORAL | 3 refills | Status: DC

## 2021-12-17 MED ORDER — EZETIMIBE 10 MG PO TABS
10.0000 mg | ORAL_TABLET | Freq: Every day | ORAL | 3 refills | Status: DC
Start: 2021-12-17 — End: 2022-06-14

## 2021-12-17 MED ORDER — LEVOTHYROXINE SODIUM 88 MCG PO TABS: 88.0000 ug | ORAL_TABLET | Freq: Every day | ORAL | 3 refills | Status: DC

## 2021-12-17 MED ORDER — LOSARTAN POTASSIUM 100 MG PO TABS: 100.0000 mg | ORAL_TABLET | Freq: Every day | ORAL | 3 refills | Status: DC

## 2021-12-17 NOTE — Assessment & Plan Note (Signed)
Cont on Levothroid 

## 2021-12-17 NOTE — Assessment & Plan Note (Signed)
Cont w/Vit D 

## 2021-12-17 NOTE — Progress Notes (Signed)
Subjective:  Patient ID: [name], male    DOB: Apr 21, 1951  Age: 71 y.o. MRN: [id number]  CC: No chief complaint on file.   HPI [name] presents for CAD, dyslipidemia, anxiety, HTN  Outpatient Medications Prior to Visit  Medication Sig Dispense Refill   aspirin 81 MG tablet Take 81 mg by mouth daily.     BOOSTRIX 5-2.5-18.5 LF-MCG/0.5 injection Inject 0.5 mLs into the muscle once.     cholecalciferol (VITAMIN D) 1000 UNITS tablet Take 2,000 Units by mouth daily.     Omega-3 Fatty Acids (FISH OIL) 1000 MG CAPS Take 2 capsules (2,000 mg total) by mouth daily. 100 capsule 3   PFIZER COVID-19 VAC BIVALENT injection Inject 0.3 mLs into the muscle once.     ciprofloxacin (CIPRO) 500 MG tablet Take 1 tablet (500 mg total) by mouth 2 (two) times daily. 14 tablet 0   diazepam (VALIUM) 10 MG tablet TAKE 1/2 TO 1 (ONE-HALF TO ONE) TABLET BY MOUTH TWICE DAILY AS NEEDED FOR ANXIETY 60 tablet 3   ezetimibe (ZETIA) 10 MG tablet Take 1 tablet by mouth once daily 90 tablet 0   levothyroxine (SYNTHROID) 88 MCG tablet Take 1 tablet (88 mcg total) by mouth daily before breakfast. 90 tablet 3   losartan (COZAAR) 100 MG tablet Take 1 tablet (100 mg total) by mouth daily. 90 tablet 3   nitroGLYCERIN (NITROSTAT) 0.4 MG SL tablet DISSOLVE ONE TABLET UNDER THE TONGUE EVERY 5 MINUTES AS NEEDED FOR CHEST PAIN.  DO NOT EXCEED A TOTAL OF 3 DOSES IN 15 MINUTES 25 tablet 0   pantoprazole (PROTONIX) 40 MG tablet Take 1 tablet (40 mg total) by mouth daily as needed. 90 tablet 3   pravastatin (PRAVACHOL) 20 MG tablet Take 1 tablet (20 mg total) by mouth daily. 90 tablet 3   HYDROcodone-acetaminophen (NORCO) 5-325 MG tablet Take 1 tablet by mouth every 6 (six) hours as needed for moderate pain. 30 tablet 0   predniSONE (DELTASONE) 20 MG tablet Take 1 tablet (20 mg total) by mouth daily with breakfast. 5 tablet 0   No facility-administered medications prior to visit.    ROS: Review of Systems   Constitutional:  Negative for appetite change, fatigue and unexpected weight change.  HENT:  Negative for congestion, nosebleeds, sneezing, sore throat and trouble swallowing.   Eyes:  Negative for itching and visual disturbance.  Respiratory:  Negative for cough.   Cardiovascular:  Negative for chest pain, palpitations and leg swelling.  Gastrointestinal:  Negative for abdominal distention, blood in stool, diarrhea and nausea.  Genitourinary:  Negative for frequency and hematuria.  Musculoskeletal:  Negative for back pain, gait problem, joint swelling and neck pain.  Skin:  Negative for rash.  Neurological:  Negative for dizziness, tremors, speech difficulty and weakness.  Psychiatric/Behavioral:  Negative for agitation, dysphoric mood, sleep disturbance and suicidal ideas. The patient is nervous/anxious.     Objective:  BP 98/60 (BP Location: Right Arm, Patient Position: Sitting, Cuff Size: Normal)   Pulse (!) 58   Temp 97.7 F (36.5 C) (Oral)   Ht '5\' 8"'$  (1.727 m)   Wt 187 lb (84.8 kg)   SpO2 94%   BMI 28.43 kg/m   BP Readings from Last 3 Encounters:  12/17/21 98/60  09/16/21 (!) 100/58  08/18/21 (!) 102/58    Wt Readings from Last 3 Encounters:  12/17/21 187 lb (84.8 kg)  09/16/21 187 lb (84.8 kg)  08/18/21 188 lb (85.3 kg)  Physical Exam Constitutional:      General: He is not in acute distress.    Appearance: Normal appearance. He is well-developed.     Comments: NAD  Eyes:     Conjunctiva/sclera: Conjunctivae normal.     Pupils: Pupils are equal, round, and reactive to light.  Neck:     Thyroid: No thyromegaly.     Vascular: No JVD.  Cardiovascular:     Rate and Rhythm: Normal rate and regular rhythm.     Heart sounds: Normal heart sounds. No murmur heard.    No friction rub. No gallop.  Pulmonary:     Effort: Pulmonary effort is normal. No respiratory distress.     Breath sounds: Normal breath sounds. No wheezing or rales.  Chest:     Chest wall: No  tenderness.  Abdominal:     General: Bowel sounds are normal. There is no distension.     Palpations: Abdomen is soft. There is no mass.     Tenderness: There is no abdominal tenderness. There is no guarding or rebound.  Musculoskeletal:        General: No tenderness. Normal range of motion.     Cervical back: Normal range of motion.  Lymphadenopathy:     Cervical: No cervical adenopathy.  Skin:    General: Skin is warm and dry.     Findings: No rash.  Neurological:     Mental Status: He is alert and oriented to person, place, and time.     Cranial Nerves: No cranial nerve deficit.     Motor: No abnormal muscle tone.     Coordination: Coordination normal.     Gait: Gait normal.     Deep Tendon Reflexes: Reflexes are normal and symmetric.  Psychiatric:        Behavior: Behavior normal.        Thought Content: Thought content normal.        Judgment: Judgment normal.     Lab Results  Component Value Date   WBC 4.9 06/17/2021   HGB 12.4 (L) 06/17/2021   HCT 37.0 (L) 06/17/2021   PLT 169.0 06/17/2021   GLUCOSE 91 06/17/2021   CHOL 184 06/17/2021   TRIG (H) 06/17/2021    598.0 Triglyceride is over 400; calculations on Lipids are invalid.   HDL 37.50 (L) 06/17/2021   LDLDIRECT 37.0 06/17/2021   LDLCALC 59 03/01/2012   ALT 59 (H) 06/17/2021   AST 52 (H) 06/17/2021   NA 137 06/17/2021   K 3.9 06/17/2021   CL 101 06/17/2021   CREATININE 1.02 06/17/2021   BUN 13 06/17/2021   CO2 28 06/17/2021   TSH 4.44 06/17/2021   PSA 1.96 06/17/2021    CT Chest Wo Contrast  Result Date: 03/20/2020 CLINICAL DATA:  COPD, previous tobacco abuse, pulmonary nodule EXAM: CT CHEST WITHOUT CONTRAST TECHNIQUE: Multidetector CT imaging of the chest was performed following the standard protocol without IV contrast. COMPARISON:  11/30/2017 FINDINGS: Cardiovascular: Unenhanced imaging of the heart and great vessels demonstrates no pericardial effusion. Extensive atherosclerosis of the coronary  vasculature unchanged. Mild atherosclerosis of the aortic arch. Normal caliber of the thoracic aorta. Mediastinum/Nodes: No enlarged mediastinal or axillary lymph nodes. Thyroid gland, trachea, and esophagus demonstrate no significant findings. Lungs/Pleura: Diffuse upper lobe predominant emphysema. No acute airspace disease, effusion, or pneumothorax. Multiple small noncalcified pulmonary nodules are again identified, unchanged since prior exam. Index nodules are as follows: Right upper lobe, image 31, 6 mm.  Stable. Left lower lobe, image  116, 4 mm.  Stable. A right middle lobe 3 mm nodule seen previously has calcified in the interim, consistent with granuloma. No new nodules or masses. Central airways are patent. Upper Abdomen: No acute abnormality. Musculoskeletal: No acute or destructive bony lesions. Reconstructed images demonstrate no additional findings. IMPRESSION: 1. Stable subcentimeter pulmonary nodules as above, largest measuring 6 mm. Given the greater than 2 year stability, no further imaging workup is required. This recommendation follows the consensus statement: Guidelines for Management of Incidental Pulmonary Nodules Detected on CT Images: From the Fleischner Society 2017; Radiology 2017; 284:228-243. 2. No acute intrathoracic process. 3. Aortic Atherosclerosis (ICD10-I70.0) and Emphysema (ICD10-J43.9). Electronically Signed   By: Randa Ngo M.D.   On: 03/20/2020 23:42    Assessment & Plan:   Problem List Items Addressed This Visit     Anxiety disorder    Continue on Diazepam prn  Potential benefits of a long term benzodiazepines  use as well as potential risks  and complications were explained to the patient and were aknowledged. Not using Rx w/alcohol      Relevant Medications   diazepam (VALIUM) 10 MG tablet   Hypertensive heart disease    Cont on Losartan      Relevant Medications   ezetimibe (ZETIA) 10 MG tablet   losartan (COZAAR) 100 MG tablet   nitroGLYCERIN  (NITROSTAT) 0.4 MG SL tablet   pravastatin (PRAVACHOL) 20 MG tablet   Hypothyroidism    Cont on Levothroid      Relevant Medications   levothyroxine (SYNTHROID) 88 MCG tablet   Vitamin D deficiency    Cont w/Vit D         Meds ordered this encounter  Medications   diazepam (VALIUM) 10 MG tablet    Sig: TAKE 1/2 TO 1 (ONE-HALF TO ONE) TABLET BY MOUTH TWICE DAILY AS NEEDED FOR ANXIETY    Dispense:  60 tablet    Refill:  3   ezetimibe (ZETIA) 10 MG tablet    Sig: Take 1 tablet (10 mg total) by mouth daily.    Dispense:  90 tablet    Refill:  3   levothyroxine (SYNTHROID) 88 MCG tablet    Sig: Take 1 tablet (88 mcg total) by mouth daily before breakfast.    Dispense:  90 tablet    Refill:  3   losartan (COZAAR) 100 MG tablet    Sig: Take 1 tablet (100 mg total) by mouth daily.    Dispense:  90 tablet    Refill:  3   nitroGLYCERIN (NITROSTAT) 0.4 MG SL tablet    Sig: DISSOLVE ONE TABLET UNDER THE TONGUE EVERY 5 MINUTES AS NEEDED FOR CHEST PAIN.  DO NOT EXCEED A TOTAL OF 3 DOSES IN 15 MINUTES    Dispense:  20 tablet    Refill:  2   pantoprazole (PROTONIX) 40 MG tablet    Sig: Take 1 tablet (40 mg total) by mouth daily as needed.    Dispense:  90 tablet    Refill:  3   pravastatin (PRAVACHOL) 20 MG tablet    Sig: Take 1 tablet (20 mg total) by mouth daily.    Dispense:  90 tablet    Refill:  3      Follow-up: Return in about 3 months (around 03/19/2022) for a follow-up visit.  Walker Kehr, MD

## 2021-12-17 NOTE — Assessment & Plan Note (Signed)
Cont on Losartan °

## 2021-12-17 NOTE — Assessment & Plan Note (Signed)
Continue on Diazepam prn  Potential benefits of a long term benzodiazepines  use as well as potential risks  and complications were explained to the patient and were aknowledged. Not using Rx w/alcohol 

## 2022-01-25 DIAGNOSIS — H269 Unspecified cataract: Secondary | ICD-10-CM | POA: Diagnosis not present

## 2022-01-25 DIAGNOSIS — K219 Gastro-esophageal reflux disease without esophagitis: Secondary | ICD-10-CM | POA: Diagnosis not present

## 2022-01-25 DIAGNOSIS — I10 Essential (primary) hypertension: Secondary | ICD-10-CM | POA: Diagnosis not present

## 2022-01-25 DIAGNOSIS — I252 Old myocardial infarction: Secondary | ICD-10-CM | POA: Diagnosis not present

## 2022-01-25 DIAGNOSIS — N529 Male erectile dysfunction, unspecified: Secondary | ICD-10-CM | POA: Diagnosis not present

## 2022-01-25 DIAGNOSIS — Z008 Encounter for other general examination: Secondary | ICD-10-CM | POA: Diagnosis not present

## 2022-01-25 DIAGNOSIS — R69 Illness, unspecified: Secondary | ICD-10-CM | POA: Diagnosis not present

## 2022-01-25 DIAGNOSIS — M1990 Unspecified osteoarthritis, unspecified site: Secondary | ICD-10-CM | POA: Diagnosis not present

## 2022-01-25 DIAGNOSIS — E039 Hypothyroidism, unspecified: Secondary | ICD-10-CM | POA: Diagnosis not present

## 2022-01-25 DIAGNOSIS — I700 Atherosclerosis of aorta: Secondary | ICD-10-CM | POA: Diagnosis not present

## 2022-01-25 DIAGNOSIS — E785 Hyperlipidemia, unspecified: Secondary | ICD-10-CM | POA: Diagnosis not present

## 2022-01-25 DIAGNOSIS — I25119 Atherosclerotic heart disease of native coronary artery with unspecified angina pectoris: Secondary | ICD-10-CM | POA: Diagnosis not present

## 2022-03-22 ENCOUNTER — Encounter: Payer: Self-pay | Admitting: Internal Medicine

## 2022-03-22 ENCOUNTER — Ambulatory Visit (INDEPENDENT_AMBULATORY_CARE_PROVIDER_SITE_OTHER): Payer: Medicare HMO | Admitting: Internal Medicine

## 2022-03-22 DIAGNOSIS — E039 Hypothyroidism, unspecified: Secondary | ICD-10-CM

## 2022-03-22 DIAGNOSIS — K219 Gastro-esophageal reflux disease without esophagitis: Secondary | ICD-10-CM | POA: Diagnosis not present

## 2022-03-22 DIAGNOSIS — J449 Chronic obstructive pulmonary disease, unspecified: Secondary | ICD-10-CM | POA: Diagnosis not present

## 2022-03-22 DIAGNOSIS — F411 Generalized anxiety disorder: Secondary | ICD-10-CM

## 2022-03-22 DIAGNOSIS — I2583 Coronary atherosclerosis due to lipid rich plaque: Secondary | ICD-10-CM | POA: Diagnosis not present

## 2022-03-22 DIAGNOSIS — I2510 Atherosclerotic heart disease of native coronary artery without angina pectoris: Secondary | ICD-10-CM

## 2022-03-22 DIAGNOSIS — R69 Illness, unspecified: Secondary | ICD-10-CM | POA: Diagnosis not present

## 2022-03-22 MED ORDER — DIAZEPAM 10 MG PO TABS: ORAL_TABLET | ORAL | 3 refills | Status: DC

## 2022-03-22 NOTE — Assessment & Plan Note (Signed)
Using MDI seldom

## 2022-03-22 NOTE — Assessment & Plan Note (Signed)
Cont on  Pravastatin, Zetia, Fish oil

## 2022-03-22 NOTE — Assessment & Plan Note (Signed)
Using Protonix prn

## 2022-03-22 NOTE — Assessment & Plan Note (Signed)
Cont on Levothroid 

## 2022-03-22 NOTE — Progress Notes (Signed)
Subjective:  Patient ID: [name], male    DOB: [birth year]  Age: 71 y.o. MRN: [id number]  CC: Follow-up (3 month f/u)   HPI [name] presents for anxiety, COPD, CAD  Outpatient Medications Prior to Visit  Medication Sig Dispense Refill   aspirin 81 MG tablet Take 81 mg by mouth daily.     cholecalciferol (VITAMIN D) 1000 UNITS tablet Take 2,000 Units by mouth daily.     ezetimibe (ZETIA) 10 MG tablet Take 1 tablet (10 mg total) by mouth daily. 90 tablet 3   levothyroxine (SYNTHROID) 88 MCG tablet Take 1 tablet (88 mcg total) by mouth daily before breakfast. 90 tablet 3   losartan (COZAAR) 100 MG tablet Take 1 tablet (100 mg total) by mouth daily. 90 tablet 3   nitroGLYCERIN (NITROSTAT) 0.4 MG SL tablet DISSOLVE ONE TABLET UNDER THE TONGUE EVERY 5 MINUTES AS NEEDED FOR CHEST PAIN.  DO NOT EXCEED A TOTAL OF 3 DOSES IN 15 MINUTES 20 tablet 2   Omega-3 Fatty Acids (FISH OIL) 1000 MG CAPS Take 2 capsules (2,000 mg total) by mouth daily. 100 capsule 3   pantoprazole (PROTONIX) 40 MG tablet Take 1 tablet (40 mg total) by mouth daily as needed. 90 tablet 3   pravastatin (PRAVACHOL) 20 MG tablet Take 1 tablet (20 mg total) by mouth daily. 90 tablet 3   diazepam (VALIUM) 10 MG tablet TAKE 1/2 TO 1 (ONE-HALF TO ONE) TABLET BY MOUTH TWICE DAILY AS NEEDED FOR ANXIETY 60 tablet 3   BOOSTRIX 5-2.5-18.5 LF-MCG/0.5 injection Inject 0.5 mLs into the muscle once.     PFIZER COVID-19 VAC BIVALENT injection Inject 0.3 mLs into the muscle once.     No facility-administered medications prior to visit.    ROS: Review of Systems  Constitutional:  Positive for fatigue. Negative for appetite change and unexpected weight change.  HENT:  Negative for congestion, nosebleeds, sneezing, sore throat and trouble swallowing.   Eyes:  Negative for itching and visual disturbance.  Respiratory:  Negative for cough.   Cardiovascular:  Negative for chest pain, palpitations and leg swelling.   Gastrointestinal:  Negative for abdominal distention, blood in stool, diarrhea and nausea.  Genitourinary:  Negative for frequency and hematuria.  Musculoskeletal:  Positive for back pain. Negative for gait problem, joint swelling and neck pain.  Skin:  Negative for rash.  Neurological:  Negative for dizziness, tremors, speech difficulty and weakness.  Psychiatric/Behavioral:  Negative for agitation, dysphoric mood, sleep disturbance and suicidal ideas. The patient is nervous/anxious.     Objective:  BP 120/68 (BP Location: Left Arm)   Pulse 60   Temp 97.6 F (36.4 C) (Oral)   Ht '5\' 8"'$  (1.727 m)   Wt 186 lb 6.4 oz (84.6 kg)   SpO2 94%   BMI 28.34 kg/m   BP Readings from Last 3 Encounters:  03/22/22 120/68  12/17/21 98/60  09/16/21 (!) 100/58    Wt Readings from Last 3 Encounters:  03/22/22 186 lb 6.4 oz (84.6 kg)  12/17/21 187 lb (84.8 kg)  09/16/21 187 lb (84.8 kg)    Physical Exam Constitutional:      General: He is not in acute distress.    Appearance: He is well-developed.     Comments: NAD  Eyes:     Conjunctiva/sclera: Conjunctivae normal.     Pupils: Pupils are equal, round, and reactive to light.  Neck:     Thyroid: No thyromegaly.     Vascular:  No JVD.  Cardiovascular:     Rate and Rhythm: Normal rate and regular rhythm.     Heart sounds: Normal heart sounds. No murmur heard.    No friction rub. No gallop.  Pulmonary:     Effort: Pulmonary effort is normal. No respiratory distress.     Breath sounds: Normal breath sounds. No wheezing or rales.  Chest:     Chest wall: No tenderness.  Abdominal:     General: Bowel sounds are normal. There is no distension.     Palpations: Abdomen is soft. There is no mass.     Tenderness: There is no abdominal tenderness. There is no guarding or rebound.  Musculoskeletal:        General: No tenderness. Normal range of motion.     Cervical back: Normal range of motion.  Lymphadenopathy:     Cervical: No cervical  adenopathy.  Skin:    General: Skin is warm and dry.     Findings: No rash.  Neurological:     Mental Status: He is alert and oriented to person, place, and time.     Cranial Nerves: No cranial nerve deficit.     Motor: No abnormal muscle tone.     Coordination: Coordination normal.     Gait: Gait normal.     Deep Tendon Reflexes: Reflexes are normal and symmetric.  Psychiatric:        Behavior: Behavior normal.        Thought Content: Thought content normal.        Judgment: Judgment normal.     Lab Results  Component Value Date   WBC 4.9 06/17/2021   HGB 12.4 (L) 06/17/2021   HCT 37.0 (L) 06/17/2021   PLT 169.0 06/17/2021   GLUCOSE 91 06/17/2021   CHOL 184 06/17/2021   TRIG (H) 06/17/2021    598.0 Triglyceride is over 400; calculations on Lipids are invalid.   HDL 37.50 (L) 06/17/2021   LDLDIRECT 37.0 06/17/2021   LDLCALC 59 03/01/2012   ALT 59 (H) 06/17/2021   AST 52 (H) 06/17/2021   NA 137 06/17/2021   K 3.9 06/17/2021   CL 101 06/17/2021   CREATININE 1.02 06/17/2021   BUN 13 06/17/2021   CO2 28 06/17/2021   TSH 4.44 06/17/2021   PSA 1.96 06/17/2021    CT Chest Wo Contrast  Result Date: 03/20/2020 CLINICAL DATA:  COPD, previous tobacco abuse, pulmonary nodule EXAM: CT CHEST WITHOUT CONTRAST TECHNIQUE: Multidetector CT imaging of the chest was performed following the standard protocol without IV contrast. COMPARISON:  11/30/2017 FINDINGS: Cardiovascular: Unenhanced imaging of the heart and great vessels demonstrates no pericardial effusion. 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 Mild atherosclerosis of the aortic arch. Normal caliber of the thoracic aorta. Mediastinum/Nodes: No enlarged mediastinal or axillary lymph nodes. Thyroid gland, trachea, and esophagus demonstrate no significant findings. Lungs/Pleura: Diffuse upper lobe predominant emphysema. No acute airspace disease, effusion, or pneumothorax. Multiple small noncalcified pulmonary  nodules are again identified, unchanged since prior exam. Index nodules are as follows: Right upper lobe, image 31, 6 mm.  Stable. Left lower lobe, image 116, 4 mm.  Stable. A right middle lobe 3 mm nodule seen previously has calcified in the interim, consistent with granuloma. No new nodules or masses. Central airways are patent. Upper Abdomen: No acute abnormality. Musculoskeletal: No acute or destructive bony lesions. Reconstructed images demonstrate no additional findings. IMPRESSION: 1. Stable subcentimeter pulmonary nodules as above, largest measuring 6 mm. Given the greater than 2  year stability, no further imaging workup is required. This recommendation follows the consensus statement: Guidelines for Management of Incidental Pulmonary Nodules Detected on CT Images: From the Fleischner Society 2017; Radiology 2017; 284:228-243. 2. No acute intrathoracic process. 3. Aortic Atherosclerosis (ICD10-I70.0) and Emphysema (ICD10-J43.9). Electronically Signed   By: Randa Ngo M.D.   On: 03/20/2020 23:42    Assessment & Plan:   Problem List Items Addressed This Visit     Anxiety disorder    Continue on Diazepam prn  Potential benefits of a long term benzodiazepines  use as well as potential risks  and complications were explained to the patient and were aknowledged. Not using Rx w/alcohol      Relevant Medications   diazepam (VALIUM) 10 MG tablet   CAD (coronary artery disease)    Cont on  Pravastatin, Zetia, Fish oil      COPD (chronic obstructive pulmonary disease) (HCC)    Using MDI seldom      GERD (gastroesophageal reflux disease)    Using Protonix prn      Hypothyroidism    Cont on Levothroid         Meds ordered this encounter  Medications   diazepam (VALIUM) 10 MG tablet    Sig: TAKE 1/2 TO 1 (ONE-HALF TO ONE) TABLET BY MOUTH TWICE DAILY AS NEEDED FOR ANXIETY    Dispense:  60 tablet    Refill:  3      Follow-up: Return in about 3 months (around 06/21/2022) for a  follow-up visit.  Walker Kehr, MD

## 2022-03-22 NOTE — Assessment & Plan Note (Signed)
Continue on Diazepam prn  Potential benefits of a long term benzodiazepines  use as well as potential risks  and complications were explained to the patient and were aknowledged. Not using Rx w/alcohol

## 2022-05-31 ENCOUNTER — Encounter: Payer: Self-pay | Admitting: Internal Medicine

## 2022-06-01 ENCOUNTER — Other Ambulatory Visit: Payer: Self-pay | Admitting: Internal Medicine

## 2022-06-01 MED ORDER — CIPROFLOXACIN HCL 500 MG PO TABS: 500.0000 mg | ORAL_TABLET | Freq: Two times a day (BID) | ORAL | 0 refills | Status: AC

## 2022-06-01 NOTE — Telephone Encounter (Signed)
Pt has [employer] for insurance no orders are in epic.Marland KitchenJohny Chess

## 2022-06-07 ENCOUNTER — Other Ambulatory Visit: Payer: Self-pay | Admitting: Internal Medicine

## 2022-06-07 DIAGNOSIS — Z0000 Encounter for general adult medical examination without abnormal findings: Secondary | ICD-10-CM

## 2022-06-07 NOTE — Progress Notes (Signed)
Pt stated wellness labs are covered completely.

## 2022-06-11 ENCOUNTER — Encounter: Payer: Self-pay | Admitting: Pharmacist

## 2022-06-11 NOTE — Progress Notes (Signed)
[facility])                                            [place] Team                                        Statin Quality Measure Assessment    06/11/2022  [name] 1951/04/21 [id number]  Mr. Wyss is currently in the [facility] (Statin Use In Patients with Cardiovascular Disease) measure for CMS. In order for him to pass, he would need to be on a moderate to high intensity statin.      Please consider ONE of the following recommendations:  Initiate high intensity statin Atorvastatin '40mg'$  once daily, #90, 3 refills   Rosuvastatin '20mg'$  once daily, #90, 3 refills    Initiate moderate intensity  statin with reduced frequency if prior  statin intolerance 1x weekly, #13, 3 refills   2x weekly, #26, 3 refills   3x weekly, #39, 3 refills    Code for past statin intolerance  (required annually)  Provider Requirements: Must asociate code during an office visit or telehealth encounter   Drug Induced Myopathy G72.0   Myalgia M79.1   Myositis, unspecified M60.9   Myopathy, unspecified G72.9   Rhabdomyolysis M62.82   Thank you for your time and consideration, Curlene Labrum, PharmD [facility] Pharmacist Office: [phone]

## 2022-06-14 ENCOUNTER — Encounter: Payer: Self-pay | Admitting: Internal Medicine

## 2022-06-14 ENCOUNTER — Ambulatory Visit (INDEPENDENT_AMBULATORY_CARE_PROVIDER_SITE_OTHER): Payer: Medicare HMO | Admitting: Internal Medicine

## 2022-06-14 ENCOUNTER — Other Ambulatory Visit (INDEPENDENT_AMBULATORY_CARE_PROVIDER_SITE_OTHER): Payer: Medicare HMO

## 2022-06-14 VITALS — BP 120/68 | HR 51 | Temp 97.5°F | Ht 68.0 in | Wt 188.2 lb

## 2022-06-14 DIAGNOSIS — D485 Neoplasm of uncertain behavior of skin: Secondary | ICD-10-CM | POA: Diagnosis not present

## 2022-06-14 DIAGNOSIS — H60312 Diffuse otitis externa, left ear: Secondary | ICD-10-CM | POA: Diagnosis not present

## 2022-06-14 DIAGNOSIS — Z0000 Encounter for general adult medical examination without abnormal findings: Secondary | ICD-10-CM

## 2022-06-14 LAB — URINALYSIS
Bilirubin Urine: NEGATIVE
Hgb urine dipstick: NEGATIVE
Ketones, ur: NEGATIVE
Leukocytes,Ua: NEGATIVE
Nitrite: NEGATIVE
Specific Gravity, Urine: 1.015 (ref 1.000–1.030)
Total Protein, Urine: NEGATIVE
Urine Glucose: NEGATIVE
Urobilinogen, UA: 0.2 (ref 0.0–1.0)
pH: 6 (ref 5.0–8.0)

## 2022-06-14 LAB — COMPREHENSIVE METABOLIC PANEL
ALT: 45 U/L (ref 0–53)
AST: 44 U/L — ABNORMAL HIGH (ref 0–37)
Albumin: 4 g/dL (ref 3.5–5.2)
Alkaline Phosphatase: 45 U/L (ref 39–117)
BUN: 10 mg/dL (ref 6–23)
CO2: 26 mEq/L (ref 19–32)
Calcium: 8.7 mg/dL (ref 8.4–10.5)
Chloride: 103 mEq/L (ref 96–112)
Creatinine, Ser: 0.85 mg/dL (ref 0.40–1.50)
GFR: 87.54 mL/min (ref >60.00)
Glucose, Bld: 99 mg/dL (ref 70–99)
Potassium: 4 mEq/L (ref 3.5–5.1)
Sodium: 136 mEq/L (ref 135–145)
Total Bilirubin: 0.3 mg/dL (ref 0.2–1.2)
Total Protein: 7.7 g/dL (ref 6.0–8.3)

## 2022-06-14 LAB — LDL CHOLESTEROL, DIRECT: Direct LDL: 23 mg/dL

## 2022-06-14 LAB — CBC WITH DIFFERENTIAL/PLATELET
Basophils Absolute: 0.1 10*3/uL (ref 0.0–0.1)
Basophils Relative: 0.9 % (ref 0.0–3.0)
Eosinophils Absolute: 0.1 10*3/uL (ref 0.0–0.7)
Eosinophils Relative: 1.4 % (ref 0.0–5.0)
HCT: 37.9 % — ABNORMAL LOW (ref 39.0–52.0)
Hemoglobin: 12.8 g/dL — ABNORMAL LOW (ref 13.0–17.0)
Lymphocytes Relative: 32.5 % (ref 12.0–46.0)
Lymphs Abs: 1.8 10*3/uL (ref 0.7–4.0)
MCHC: 33.7 g/dL (ref 30.0–36.0)
MCV: 79.1 fl (ref 78.0–100.0)
Monocytes Absolute: 0.5 10*3/uL (ref 0.1–1.0)
Monocytes Relative: 10 % (ref 3.0–12.0)
Neutro Abs: 3 10*3/uL (ref 1.4–7.7)
Neutrophils Relative %: 55.2 % (ref 43.0–77.0)
Platelets: 172 10*3/uL (ref 150.0–400.0)
RBC: 4.79 Mil/uL (ref 4.22–5.81)
RDW: 17.5 % — ABNORMAL HIGH (ref 11.5–15.5)
WBC: 5.5 10*3/uL (ref 4.0–10.5)

## 2022-06-14 LAB — TSH: TSH: 4.44 u[IU]/mL (ref 0.35–5.50)

## 2022-06-14 LAB — LIPID PANEL
Cholesterol: 146 mg/dL (ref 0–200)
HDL: 32.9 mg/dL — ABNORMAL LOW (ref >39.00)
Total CHOL/HDL Ratio: 4
Triglycerides: 477 mg/dL — ABNORMAL HIGH (ref 0.0–149.0)

## 2022-06-14 LAB — PSA: PSA: 1.49 ng/mL (ref 0.10–4.00)

## 2022-06-14 MED ORDER — EZETIMIBE 10 MG PO TABS: 10.0000 mg | ORAL_TABLET | Freq: Every day | ORAL | 3 refills | Status: DC

## 2022-06-14 MED ORDER — PANTOPRAZOLE SODIUM 40 MG PO TBEC: 40.0000 mg | DELAYED_RELEASE_TABLET | Freq: Every day | ORAL | 3 refills | Status: DC | PRN

## 2022-06-14 MED ORDER — LEVOTHYROXINE SODIUM 88 MCG PO TABS: 88.0000 ug | ORAL_TABLET | Freq: Every day | ORAL | 3 refills | Status: DC

## 2022-06-14 MED ORDER — LOSARTAN POTASSIUM 100 MG PO TABS: 100.0000 mg | ORAL_TABLET | Freq: Every day | ORAL | 3 refills | Status: DC

## 2022-06-14 MED ORDER — DIAZEPAM 10 MG PO TABS: ORAL_TABLET | ORAL | 3 refills | Status: DC

## 2022-06-14 MED ORDER — PRAVASTATIN SODIUM 20 MG PO TABS: 20.0000 mg | ORAL_TABLET | Freq: Every day | ORAL | 3 refills | Status: DC

## 2022-06-14 NOTE — Assessment & Plan Note (Signed)
  We discussed age appropriate health related issues, including available/recomended screening tests and vaccinations. Labs were ordered to be later reviewed . All questions were answered. We discussed one or more of the following - seat belt use, use of sunscreen/sun exposure exercise, safe sex, fall risk reduction, second hand smoke exposure, firearm use and storage, seat belt use, a need for adhering to healthy diet and exercise. Labs were ordered.  All questions were answered. Colon done in 2013, 2019 Dr Regis Bill  - pt deferred

## 2022-06-14 NOTE — Assessment & Plan Note (Signed)
Mole on forehead Skin bx was suggested

## 2022-06-14 NOTE — Assessment & Plan Note (Signed)
L 06/2022 recurrent Do not use Q tips Remove hearing aid Cortisporin prn ENT ref

## 2022-06-14 NOTE — Progress Notes (Signed)
Subjective:  Patient ID: [name], male    DOB: 12-30-1950  Age: 71 y.o. MRN: [id number]  CC: Annual Exam   HPI [name] presents for a well exam Pt states all labs are covered for well exam C/o L ear pain  Outpatient Medications Prior to Visit  Medication Sig Dispense Refill   aspirin 81 MG tablet Take 81 mg by mouth daily.     cholecalciferol (VITAMIN D) 1000 UNITS tablet Take 2,000 Units by mouth daily.     nitroGLYCERIN (NITROSTAT) 0.4 MG SL tablet DISSOLVE ONE TABLET UNDER THE TONGUE EVERY 5 MINUTES AS NEEDED FOR CHEST PAIN.  DO NOT EXCEED A TOTAL OF 3 DOSES IN 15 MINUTES 20 tablet 2   Omega-3 Fatty Acids (FISH OIL) 1000 MG CAPS Take 2 capsules (2,000 mg total) by mouth daily. 100 capsule 3   diazepam (VALIUM) 10 MG tablet TAKE 1/2 TO 1 (ONE-HALF TO ONE) TABLET BY MOUTH TWICE DAILY AS NEEDED FOR ANXIETY 60 tablet 3   ezetimibe (ZETIA) 10 MG tablet Take 1 tablet (10 mg total) by mouth daily. 90 tablet 3   levothyroxine (SYNTHROID) 88 MCG tablet Take 1 tablet (88 mcg total) by mouth daily before breakfast. 90 tablet 3   losartan (COZAAR) 100 MG tablet Take 1 tablet (100 mg total) by mouth daily. 90 tablet 3   pantoprazole (PROTONIX) 40 MG tablet Take 1 tablet (40 mg total) by mouth daily as needed. 90 tablet 3   pravastatin (PRAVACHOL) 20 MG tablet Take 1 tablet (20 mg total) by mouth daily. 90 tablet 3   No facility-administered medications prior to visit.    ROS: Review of Systems  Constitutional:  Negative for appetite change, fatigue and unexpected weight change.  HENT:  Negative for congestion, nosebleeds, sneezing, sore throat and trouble swallowing.   Eyes:  Negative for itching and visual disturbance.  Respiratory:  Negative for cough.   Cardiovascular:  Negative for chest pain, palpitations and leg swelling.  Gastrointestinal:  Negative for abdominal distention, blood in stool, diarrhea and nausea.  Genitourinary:  Negative for frequency and hematuria.   Musculoskeletal:  Positive for arthralgias and back pain. Negative for gait problem, joint swelling and neck pain.  Skin:  Negative for rash.  Neurological:  Negative for dizziness, tremors, speech difficulty and weakness.  Psychiatric/Behavioral:  Negative for agitation, dysphoric mood, sleep disturbance and suicidal ideas. The patient is nervous/anxious.     Objective:  BP 120/68 (BP Location: Left Arm)   Pulse (!) 51   Temp (!) 97.5 F (36.4 C) (Oral)   Ht '5\' 8"'$  (1.727 m)   Wt 188 lb 3.2 oz (85.4 kg)   SpO2 97%   BMI 28.62 kg/m   BP Readings from Last 3 Encounters:  06/14/22 120/68  03/22/22 120/68  12/17/21 98/60    Wt Readings from Last 3 Encounters:  06/14/22 188 lb 3.2 oz (85.4 kg)  03/22/22 186 lb 6.4 oz (84.6 kg)  12/17/21 187 lb (84.8 kg)    Physical Exam Constitutional:      General: He is not in acute distress.    Appearance: Normal appearance. He is well-developed.     Comments: NAD  Eyes:     Conjunctiva/sclera: Conjunctivae normal.     Pupils: Pupils are equal, round, and reactive to light.  Neck:     Thyroid: No thyromegaly.     Vascular: No JVD.  Cardiovascular:     Rate and Rhythm: Normal rate and regular  rhythm.     Heart sounds: Normal heart sounds. No murmur heard.    No friction rub. No gallop.  Pulmonary:     Effort: Pulmonary effort is normal. No respiratory distress.     Breath sounds: Normal breath sounds. No wheezing or rales.  Chest:     Chest wall: No tenderness.  Abdominal:     General: Bowel sounds are normal. There is no distension.     Palpations: Abdomen is soft. There is no mass.     Tenderness: There is no abdominal tenderness. There is no guarding or rebound.  Musculoskeletal:        General: No tenderness. Normal range of motion.     Cervical back: Normal range of motion.  Lymphadenopathy:     Cervical: No cervical adenopathy.  Skin:    General: Skin is warm and dry.     Findings: No rash.  Neurological:     Mental  Status: He is alert and oriented to person, place, and time.     Cranial Nerves: No cranial nerve deficit.     Motor: No abnormal muscle tone.     Coordination: Coordination normal.     Gait: Gait normal.     Deep Tendon Reflexes: Reflexes are normal and symmetric.  Psychiatric:        Behavior: Behavior normal.        Thought Content: Thought content normal.        Judgment: Judgment normal.   L ear w/OE signs, exudate  Mole on forehead  Lab Results  Component Value Date   WBC 4.9 06/17/2021   HGB 12.4 (L) 06/17/2021   HCT 37.0 (L) 06/17/2021   PLT 169.0 06/17/2021   GLUCOSE 91 06/17/2021   CHOL 184 06/17/2021   TRIG (H) 06/17/2021    598.0 Triglyceride is over 400; calculations on Lipids are invalid.   HDL 37.50 (L) 06/17/2021   LDLDIRECT 37.0 06/17/2021   LDLCALC 59 03/01/2012   ALT 59 (H) 06/17/2021   AST 52 (H) 06/17/2021   NA 137 06/17/2021   K 3.9 06/17/2021   CL 101 06/17/2021   CREATININE 1.02 06/17/2021   BUN 13 06/17/2021   CO2 28 06/17/2021   TSH 4.44 06/17/2021   PSA 1.96 06/17/2021    CT Chest Wo Contrast  Result Date: 03/20/2020 CLINICAL DATA:  COPD, previous tobacco abuse, pulmonary nodule EXAM: CT CHEST WITHOUT CONTRAST TECHNIQUE: Multidetector CT imaging of the chest was performed following the standard protocol without IV contrast. COMPARISON:  11/30/2017 FINDINGS: Cardiovascular: Unenhanced imaging of the heart and great vessels demonstrates no pericardial effusion. 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 Mild atherosclerosis of the aortic arch. Normal caliber of the thoracic aorta. Mediastinum/Nodes: No enlarged mediastinal or axillary lymph nodes. Thyroid gland, trachea, and esophagus demonstrate no significant findings. Lungs/Pleura: Diffuse upper lobe predominant emphysema. No acute airspace disease, effusion, or pneumothorax. Multiple small noncalcified pulmonary nodules are again identified, unchanged since prior exam. Index  nodules are as follows: Right upper lobe, image 31, 6 mm.  Stable. Left lower lobe, image 116, 4 mm.  Stable. A right middle lobe 3 mm nodule seen previously has calcified in the interim, consistent with granuloma. No new nodules or masses. Central airways are patent. Upper Abdomen: No acute abnormality. Musculoskeletal: No acute or destructive bony lesions. Reconstructed images demonstrate no additional findings. IMPRESSION: 1. Stable subcentimeter pulmonary nodules as above, largest measuring 6 mm. Given the greater than 2 year stability, no further imaging workup is  required. This recommendation follows the consensus statement: Guidelines for Management of Incidental Pulmonary Nodules Detected on CT Images: From the Fleischner Society 2017; Radiology 2017; 284:228-243. 2. No acute intrathoracic process. 3. Aortic Atherosclerosis (ICD10-I70.0) and Emphysema (ICD10-J43.9). Electronically Signed   By: Randa Ngo M.D.   On: 03/20/2020 23:42    Assessment & Plan:   Problem List Items Addressed This Visit     Neoplasm of uncertain behavior of skin    Mole on forehead Skin bx was suggested      Otitis externa - Primary     L 06/2022 recurrent Do not use Q tips Remove hearing aid Cortisporin prn ENT ref      Relevant Orders   Ambulatory referral to ENT   Well adult exam     We discussed age appropriate health related issues, including available/recomended screening tests and vaccinations. Labs were ordered to be later reviewed . All questions were answered. We discussed one or more of the following - seat belt use, use of sunscreen/sun exposure exercise, safe sex, fall risk reduction, second hand smoke exposure, firearm use and storage, seat belt use, a need for adhering to healthy diet and exercise. Labs were ordered.  All questions were answered. Colon done in 2013, 2019 Dr Regis Bill  - pt deferred         Meds ordered this encounter  Medications   diazepam (VALIUM) 10 MG  tablet    Sig: TAKE 1/2 TO 1 (ONE-HALF TO ONE) TABLET BY MOUTH TWICE DAILY AS NEEDED FOR ANXIETY    Dispense:  60 tablet    Refill:  3   ezetimibe (ZETIA) 10 MG tablet    Sig: Take 1 tablet (10 mg total) by mouth daily.    Dispense:  90 tablet    Refill:  3   levothyroxine (SYNTHROID) 88 MCG tablet    Sig: Take 1 tablet (88 mcg total) by mouth daily before breakfast.    Dispense:  90 tablet    Refill:  3   losartan (COZAAR) 100 MG tablet    Sig: Take 1 tablet (100 mg total) by mouth daily.    Dispense:  90 tablet    Refill:  3   pantoprazole (PROTONIX) 40 MG tablet    Sig: Take 1 tablet (40 mg total) by mouth daily as needed.    Dispense:  90 tablet    Refill:  3   pravastatin (PRAVACHOL) 20 MG tablet    Sig: Take 1 tablet (20 mg total) by mouth daily.    Dispense:  90 tablet    Refill:  3      Follow-up: Return in about 3 months (around 09/13/2022) for a follow-up visit.  Walker Kehr, MD

## 2022-06-15 ENCOUNTER — Other Ambulatory Visit: Payer: Self-pay | Admitting: Internal Medicine

## 2022-06-15 DIAGNOSIS — E785 Hyperlipidemia, unspecified: Secondary | ICD-10-CM

## 2022-06-15 MED ORDER — FENOFIBRATE 48 MG PO TABS: 48.0000 mg | ORAL_TABLET | Freq: Every day | ORAL | 11 refills | Status: DC

## 2022-06-16 ENCOUNTER — Encounter: Payer: Medicare HMO | Admitting: Internal Medicine

## 2022-06-17 DIAGNOSIS — H7292 Unspecified perforation of tympanic membrane, left ear: Secondary | ICD-10-CM | POA: Insufficient documentation

## 2022-06-17 DIAGNOSIS — H60392 Other infective otitis externa, left ear: Secondary | ICD-10-CM | POA: Diagnosis not present

## 2022-06-17 DIAGNOSIS — J342 Deviated nasal septum: Secondary | ICD-10-CM | POA: Insufficient documentation

## 2022-06-17 DIAGNOSIS — B369 Superficial mycosis, unspecified: Secondary | ICD-10-CM | POA: Insufficient documentation

## 2022-06-29 DIAGNOSIS — H7292 Unspecified perforation of tympanic membrane, left ear: Secondary | ICD-10-CM | POA: Diagnosis not present

## 2022-06-29 DIAGNOSIS — H60392 Other infective otitis externa, left ear: Secondary | ICD-10-CM | POA: Diagnosis not present

## 2022-07-06 ENCOUNTER — Encounter: Payer: Self-pay | Admitting: Internal Medicine

## 2022-07-07 ENCOUNTER — Other Ambulatory Visit: Payer: Self-pay | Admitting: Internal Medicine

## 2022-07-07 DIAGNOSIS — D485 Neoplasm of uncertain behavior of skin: Secondary | ICD-10-CM

## 2022-07-08 DIAGNOSIS — H7292 Unspecified perforation of tympanic membrane, left ear: Secondary | ICD-10-CM | POA: Diagnosis not present

## 2022-07-08 DIAGNOSIS — H6092 Unspecified otitis externa, left ear: Secondary | ICD-10-CM | POA: Diagnosis not present

## 2022-07-15 DIAGNOSIS — H7292 Unspecified perforation of tympanic membrane, left ear: Secondary | ICD-10-CM | POA: Diagnosis not present

## 2022-07-15 DIAGNOSIS — H60392 Other infective otitis externa, left ear: Secondary | ICD-10-CM | POA: Diagnosis not present

## 2022-07-29 ENCOUNTER — Ambulatory Visit (INDEPENDENT_AMBULATORY_CARE_PROVIDER_SITE_OTHER): Payer: Medicare HMO

## 2022-07-29 VITALS — Ht 68.0 in | Wt 184.0 lb

## 2022-07-29 DIAGNOSIS — Z0000 Encounter for general adult medical examination without abnormal findings: Secondary | ICD-10-CM | POA: Diagnosis not present

## 2022-07-29 DIAGNOSIS — Z1211 Encounter for screening for malignant neoplasm of colon: Secondary | ICD-10-CM | POA: Diagnosis not present

## 2022-07-29 NOTE — Patient Instructions (Addendum)
[name] , Thank you for taking time to come for your Medicare Wellness Visit. I appreciate your ongoing commitment to your health goals. Please review the following plan we discussed and let me know if I can assist you in the future.   These are the goals we discussed:  Goals      My goal for 2024 is to stay healthy by maintaining my health, staying independent and physically active.     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        This is a list of the screening recommended for you and due dates:  Health Maintenance  Topic Date Due   COVID-19 Vaccine (7 - 2023-24 season) 06/14/2022   Colon Cancer Screening  04/05/2023   Medicare Annual Wellness Visit  07/30/2023   DTaP/Tdap/Td vaccine (3 - Td or Tdap) 09/17/2031   Pneumonia Vaccine  Completed   Flu Shot  Completed   Hepatitis C Screening: USPSTF Recommendation to screen - Ages 18-79 yo.  Completed   Zoster (Shingles) Vaccine  Completed   HPV Vaccine  Aged Out   Immunization History  Administered Date(s) Administered   COVID-19, mRNA, vaccine(Comirnaty)12 years and older 04/19/2022   Fluad Quad(high Dose 65+) 02/22/2019, 03/18/2021, 03/16/2022   Influenza Whole 07/07/2009, 03/03/2010   Influenza, Quadrivalent, Recombinant, Inj, Pf 02/21/2018   Influenza,inj,Quad PF,6+ Mos 03/18/2015   Influenza-Unspecified 03/05/2016, 04/22/2017, 03/28/2020   PFIZER(Purple Top)SARS-COV-2 Vaccination 08/11/2019, 09/01/2019, 04/17/2020, 10/20/2020, 04/13/2021   Pneumococcal Conjugate-13 08/08/2013, 09/05/2016   Pneumococcal Polysaccharide-23 03/08/2012, 08/10/2017   Respiratory Syncytial Virus Vaccine,Recomb Aduvanted(Arexvy) 03/16/2022   Td 06/01/2010   Tdap 09/16/2021   Zoster Recombinat (Shingrix) 08/10/2017, 10/21/2017   Advanced directives: No; Patient has paperwork.  Conditions/risks identified: Yes  Next appointment: Follow up in one year for your annual wellness visit.   Preventive Care 49 Years and Older,  Male  Preventive care refers to lifestyle choices and visits with your health care provider that can promote health and wellness. What does preventive care include? A yearly physical exam. This is also called an annual well check. Dental exams once or twice a year. Routine eye exams. Ask your health care provider how often you should have your eyes checked. Personal lifestyle choices, including: Daily care of your teeth and gums. Regular physical activity. Eating a healthy diet. Avoiding tobacco and drug use. Limiting alcohol use. Practicing safe sex. Taking low doses of aspirin every day. Taking vitamin and mineral supplements as recommended by your health care provider. What happens during an annual well check? The services and screenings done by your health care provider during your annual well check will depend on your age, overall health, lifestyle risk factors, and family history of disease. Counseling  Your health care provider may ask you questions about your: Alcohol use. Tobacco use. Drug use. Emotional well-being. Home and relationship well-being. Sexual activity. Eating habits. History of falls. Memory and ability to understand (cognition). Work and work Statistician. Screening  You may have the following tests or measurements: Height, weight, and BMI. Blood pressure. Lipid and cholesterol levels. These may be checked every 5 years, or more frequently if you are over 15 years old. Skin check. Lung cancer screening. You may have this screening every year starting at age 50 if you have a 30-pack-year history of smoking and currently smoke or have quit within the past 15 years. Fecal occult blood test (FOBT) of the stool. You may have this test every year starting at age 4. Flexible  sigmoidoscopy or colonoscopy. You may have a sigmoidoscopy every 5 years or a colonoscopy every 10 years starting at age 58. Prostate cancer screening. Recommendations will vary depending  on your family history and other risks. Hepatitis C blood test. Hepatitis B blood test. Sexually transmitted disease (STD) testing. Diabetes screening. This is done by checking your blood sugar (glucose) after you have not eaten for a while (fasting). You may have this done every 1-3 years. Abdominal aortic aneurysm (AAA) screening. You may need this if you are a current or former smoker. Osteoporosis. You may be screened starting at age 40 if you are at high risk. Talk with your health care provider about your test results, treatment options, and if necessary, the need for more tests. Vaccines  Your health care provider may recommend certain vaccines, such as: Influenza vaccine. This is recommended every year. Tetanus, diphtheria, and acellular pertussis (Tdap, Td) vaccine. You may need a Td booster every 10 years. Zoster vaccine. You may need this after age 29. Pneumococcal 13-valent conjugate (PCV13) vaccine. One dose is recommended after age 60. Pneumococcal polysaccharide (PPSV23) vaccine. One dose is recommended after age 58. Talk to your health care provider about which screenings and vaccines you need and how often you need them. This information is not intended to replace advice given to you by your health care provider. Make sure you discuss any questions you have with your health care provider. Document Released: 07/18/2015 Document Revised: 03/10/2016 Document Reviewed: 04/22/2015 Elsevier Interactive Patient Education  2017 [employer] Prevention in the Home Falls can cause injuries. They can happen to people of all ages. There are many things you can do to make your home safe and to help prevent falls. What can I do on the outside of my home? Regularly fix the edges of walkways and driveways and fix any cracks. Remove anything that might make you trip as you walk through a door, such as a raised step or threshold. Trim any bushes or trees on the path to your home. Use  bright outdoor lighting. Clear any walking paths of anything that might make someone trip, such as rocks or tools. Regularly check to see if handrails are loose or broken. Make sure that both sides of any steps have handrails. Any raised decks and porches should have guardrails on the edges. Have any leaves, snow, or ice cleared regularly. Use sand or salt on walking paths during winter. Clean up any spills in your garage right away. This includes oil or grease spills. What can I do in the bathroom? Use night lights. Install grab bars by the toilet and in the tub and shower. Do not use towel bars as grab bars. Use non-skid mats or decals in the tub or shower. If you need to sit down in the shower, use a plastic, non-slip stool. Keep the floor dry. Clean up any water that spills on the floor as soon as it happens. Remove soap buildup in the tub or shower regularly. Attach bath mats securely with double-sided non-slip rug tape. Do not have throw rugs and other things on the floor that can make you trip. What can I do in the bedroom? Use night lights. Make sure that you have a light by your bed that is easy to reach. Do not use any sheets or blankets that are too big for your bed. They should not hang down onto the floor. Have a firm chair that has side arms. You can use this for  support while you get dressed. Do not have throw rugs and other things on the floor that can make you trip. What can I do in the kitchen? Clean up any spills right away. Avoid walking on wet floors. Keep items that you use a lot in easy-to-reach places. If you need to reach something above you, use a strong step stool that has a grab bar. Keep electrical cords out of the way. Do not use floor polish or wax that makes floors slippery. If you must use wax, use non-skid floor wax. Do not have throw rugs and other things on the floor that can make you trip. What can I do with my stairs? Do not leave any items on the  stairs. Make sure that there are handrails on both sides of the stairs and use them. Fix handrails that are broken or loose. Make sure that handrails are as long as the stairways. Check any carpeting to make sure that it is firmly attached to the stairs. Fix any carpet that is loose or worn. Avoid having throw rugs at the top or bottom of the stairs. If you do have throw rugs, attach them to the floor with carpet tape. Make sure that you have a light switch at the top of the stairs and the bottom of the stairs. If you do not have them, ask someone to add them for you. What else can I do to help prevent falls? Wear shoes that: Do not have high heels. Have rubber bottoms. Are comfortable and fit you well. Are closed at the toe. Do not wear sandals. If you use a stepladder: Make sure that it is fully opened. Do not climb a closed stepladder. Make sure that both sides of the stepladder are locked into place. Ask someone to hold it for you, if possible. Clearly mark and make sure that you can see: Any grab bars or handrails. First and last steps. Where the edge of each step is. Use tools that help you move around (mobility aids) if they are needed. These include: Canes. Walkers. Scooters. Crutches. Turn on the lights when you go into a dark area. Replace any light bulbs as soon as they burn out. Set up your furniture so you have a clear path. Avoid moving your furniture around. If any of your floors are uneven, fix them. If there are any pets around you, be aware of where they are. Review your medicines with your doctor. Some medicines can make you feel dizzy. This can increase your chance of falling. Ask your doctor what other things that you can do to help prevent falls. This information is not intended to replace advice given to you by your health care provider. Make sure you discuss any questions you have with your health care provider. Document Released: 04/17/2009 Document Revised:  11/27/2015 Document Reviewed: 07/26/2014 Elsevier Interactive Patient Education  2017 [employer].

## 2022-07-29 NOTE — Progress Notes (Cosign Needed Addendum)
Virtual Visit via Telephone Note  I connected with  [name] on 07/29/22 at  4:00 PM EST by telephone and verified that I am speaking with the correct person using two identifiers.  Location: Patient: Home Provider: [facility] Persons participating in the virtual visit: [place]   I discussed the limitations, risks, security and privacy concerns of performing an evaluation and management service by telephone and the availability of in person appointments. The patient expressed understanding and agreed to proceed.  Interactive audio and video telecommunications were attempted between this nurse and patient, however failed, due to patient having technical difficulties OR patient did not have access to video capability.  We continued and completed visit with audio only.  Some vital signs may be absent or patient reported.   Sheral Flow, LPN  Subjective:   [name] is a 72 y.o. male who presents for Medicare Annual/Subsequent preventive examination.  Review of Systems     Cardiac Risk Factors include: advanced age (>48mn, >>33women);family history of premature cardiovascular disease;dyslipidemia;hypertension;male gender     Objective:    Today's Vitals   07/29/22 1604 07/29/22 1605  Weight: 184 lb (83.5 kg)   Height: '5\' 8"'$  (1.727 m)   PainSc: 0-No pain 0-No pain   Body mass index is 27.98 kg/m.     07/29/2022    4:06 PM 07/20/2021    1:06 PM 03/03/2020    1:19 PM 11/30/2017    4:07 PM 11/30/2017   10:23 AM 11/08/2017    9:41 AM  Advanced Directives  Does Patient Have a Medical Advance Directive? No No No No No No  Would patient like information on creating a medical advance directive? No - Patient declined No - Patient declined No - Patient declined No - Patient declined Yes (ED - Information included in AVS)     Current Medications (verified) Outpatient Encounter Medications as of 07/29/2022  Medication Sig   aspirin 81 MG  tablet Take 81 mg by mouth daily.   cholecalciferol (VITAMIN D) 1000 UNITS tablet Take 2,000 Units by mouth daily.   diazepam (VALIUM) 10 MG tablet TAKE 1/2 TO 1 (ONE-HALF TO ONE) TABLET BY MOUTH TWICE DAILY AS NEEDED FOR ANXIETY   ezetimibe (ZETIA) 10 MG tablet Take 1 tablet (10 mg total) by mouth daily.   fenofibrate (TRICOR) 48 MG tablet Take 1 tablet (48 mg total) by mouth daily.   levothyroxine (SYNTHROID) 88 MCG tablet Take 1 tablet (88 mcg total) by mouth daily before breakfast.   losartan (COZAAR) 100 MG tablet Take 1 tablet (100 mg total) by mouth daily.   nitroGLYCERIN (NITROSTAT) 0.4 MG SL tablet DISSOLVE ONE TABLET UNDER THE TONGUE EVERY 5 MINUTES AS NEEDED FOR CHEST PAIN.  DO NOT EXCEED A TOTAL OF 3 DOSES IN 15 MINUTES   Omega-3 Fatty Acids (FISH OIL) 1000 MG CAPS Take 2 capsules (2,000 mg total) by mouth daily.   pantoprazole (PROTONIX) 40 MG tablet Take 1 tablet (40 mg total) by mouth daily as needed.   pravastatin (PRAVACHOL) 20 MG tablet Take 1 tablet (20 mg total) by mouth daily.   No facility-administered encounter medications on file as of 07/29/2022.    Allergies (verified) Atorvastatin, Bee venom, Crestor [rosuvastatin calcium], and Statins   History: Past Medical History:  Diagnosis Date   Allergy    Allergy to bee sting    Anxiety    CAD (coronary artery disease)    statin intolerant   Cyst  Depression    Dermatitis due to sun    ED (erectile dysfunction)    Hyperlipidemia    Hypertension    MI (myocardial infarction) ([place]) 2002   OA (osteoarthritis)    Tobacco abuse    Past Surgical History:  Procedure Laterality Date   broken jaw     1970-80's    cardiac catherization  07-14-01   COLONOSCOPY  2013   ESOPHAGOGASTRODUODENOSCOPY  10-06-01   stress cardiolite  06-30-01   UPPER GASTROINTESTINAL ENDOSCOPY     Family History  Problem Relation Age of Onset   Cancer Brother        lymphoma   Heart disease Father    Colon cancer Neg Hx    Esophageal  cancer Neg Hx    Rectal cancer Neg Hx    Stomach cancer Neg Hx    Colon polyps Neg Hx    Social History   Socioeconomic History   Marital status: Single    Spouse name: Not on file   Number of children: Not on file   Years of education: 9 years   Highest education level: 9th grade  Occupational History   Occupation: Retired    Comment: 12/2012  Tobacco Use   Smoking status: Former    Packs/day: 2.00    Types: Cigarettes    Quit date: 11/02/2012    Years since quitting: 9.7   Smokeless tobacco: Never  Vaping Use   Vaping Use: Never used  Substance and Sexual Activity   Alcohol use: Not Currently    Alcohol/week: 12.0 standard drinks of alcohol    Types: 12 Cans of beer per week    Comment: Quit 07/06/2021   Drug use: Yes    Types: Marijuana    Comment: marijuana   Sexual activity: Yes  Other Topics Concern   Not on file  Social History Narrative   Regular exercise-yes (5 days, 30 minutes)   Lives with girlfriend.   Quit drinking 07/06/2021   Social Determinants of Health   Financial Resource Strain: Low Risk  (07/29/2022)   Overall Financial Resource Strain (CARDIA)    Difficulty of Paying Living Expenses: Not hard at all  Food Insecurity: No Food Insecurity (07/29/2022)   Hunger Vital Sign    Worried About Running Out of Food in the Last Year: Never true    Ran Out of Food in the Last Year: Never true  Transportation Needs: No Transportation Needs (07/29/2022)   PRAPARE - Hydrologist (Medical): No    Lack of Transportation (Non-Medical): No  Physical Activity: Sufficiently Active (07/29/2022)   Exercise Vital Sign    Days of Exercise per Week: 4 days    Minutes of Exercise per Session: 60 min  Recent Concern: Physical Activity - Insufficiently Active (07/25/2022)   Exercise Vital Sign    Days of Exercise per Week: 4 days    Minutes of Exercise per Session: 30 min  Stress: No Stress Concern Present (07/29/2022)   [place]    Feeling of Stress : Only a little  Social Connections: Unknown (07/29/2022)   Social Connection and Isolation Panel [NHANES]    Frequency of Communication with Friends and Family: More than three times a week    Frequency of Social Gatherings with Friends and Family: Twice a week    Attends Religious Services: Patient refused    Active Member of Clubs or Organizations: No    Attends  Club or Organization Meetings: 1 to 4 times per year    Marital Status: Divorced    Tobacco Counseling Counseling given: Not Answered   Clinical Intake:  Pre-visit preparation completed: Yes  Pain : No/denies pain Pain Score: 0-No pain     BMI - recorded: 27.98 Nutritional Risks: None Diabetes: No  How often do you need to have someone help you when you read instructions, pamphlets, or other written materials from your doctor or pharmacy?: 1 - Never What is the last grade level you completed in school?: 10th grade  Diabetic? No  Interpreter Needed?: No  Information entered by :: Lisette Abu, LPN.   Activities of Daily Living    07/29/2022    4:10 PM 07/25/2022   10:58 AM  In your present state of health, do you have any difficulty performing the following activities:  Hearing? 1 1  Vision? 0 0  Difficulty concentrating or making decisions? 0 0  Walking or climbing stairs? 0 0  Dressing or bathing? 0 0  Doing errands, shopping? 0 0  Preparing Food and eating ? N N  Using the Toilet? N N  In the past six months, have you accidently leaked urine? N N  Do you have problems with loss of bowel control? N N  Managing your Medications? N N  Managing your Finances? N N  Housekeeping or managing your Housekeeping? N N    Patient Care Team: Plotnikov, Evie Lacks, MD as PCP - General  Indicate any recent Medical Services you may have received from other than [facility] providers in the past year (date may be approximate).      Assessment:   This is a routine wellness examination for [name].  Hearing/Vision screen Hearing Screening - Comments:: Patient wears hearing aids. Vision Screening - Comments:: Wears rx glasses - up to date with routine eye exams with [facility]   Dietary issues and exercise activities discussed: Current Exercise Habits: Home exercise routine, Time (Minutes): 60, Frequency (Times/Week): 4, Weekly Exercise (Minutes/Week): 240, Intensity: Moderate   Goals Addressed             This Visit's Progress    My goal for 2024 is to stay healthy by maintaining my health, staying independent and physically active.       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      Depression Screen    07/29/2022    4:09 PM 06/14/2022    9:15 AM 07/20/2021    1:10 PM 06/17/2021    9:08 AM 06/11/2020    8:26 AM 03/03/2020    1:27 PM 03/03/2020    1:21 PM  PHQ 2/9 Scores  PHQ - 2 Score 0 0 0 0 0 0 0  PHQ- 9 Score 0 0 0 0       Fall Risk    07/29/2022    4:07 PM 07/25/2022   10:58 AM 06/14/2022    9:15 AM 07/20/2021    1:07 PM 06/17/2021    9:08 AM  Fall Risk   Falls in the past year? 0 0 0 0 0  Number falls in past yr: 0  0 0 0  Injury with Fall? 0  0 0 0  Risk for fall due to : No Fall Risks  No Fall Risks No Fall Risks No Fall Risks  Follow up Falls prevention discussed        FALL RISK PREVENTION PERTAINING TO THE HOME:  Any stairs in or around  the home? No  If so, are there any without handrails? No  Home free of loose throw rugs in walkways, pet beds, electrical cords, etc? Yes  Adequate lighting in your home to reduce risk of falls? Yes   ASSISTIVE DEVICES UTILIZED TO PREVENT FALLS:  Life alert? No  Use of a cane, walker or w/c? No  Grab bars in the bathroom? Yes  Shower chair or bench in shower? No  Elevated toilet seat or a handicapped toilet? No   TIMED UP AND GO: Phone Visit  Was the test performed? No .   Cognitive Function:         07/29/2022    4:15 PM  6CIT Screen  What Year? 0 points  What month? 0 points  What time? 0 points  Count back from 20 0 points  Months in reverse 0 points  Repeat phrase 0 points  Total Score 0 points    Immunizations Immunization History  Administered Date(s) Administered   COVID-19, mRNA, vaccine(Comirnaty)12 years and older 04/19/2022   Fluad Quad(high Dose 65+) 02/22/2019, 03/18/2021, 03/16/2022   Influenza Whole 07/07/2009, 03/03/2010   Influenza, Quadrivalent, Recombinant, Inj, Pf 02/21/2018   Influenza,inj,Quad PF,6+ Mos 03/18/2015   Influenza-Unspecified 03/05/2016, 04/22/2017, 03/28/2020   PFIZER(Purple Top)SARS-COV-2 Vaccination 08/11/2019, 09/01/2019, 04/17/2020, 10/20/2020, 04/13/2021   Pneumococcal Conjugate-13 08/08/2013, 09/05/2016   Pneumococcal Polysaccharide-23 03/08/2012, 08/10/2017   Respiratory Syncytial Virus Vaccine,Recomb Aduvanted(Arexvy) 03/16/2022   Td 06/01/2010   Tdap 09/16/2021   Zoster Recombinat (Shingrix) 08/10/2017, 10/21/2017    TDAP status: Up to date  Flu Vaccine status: Up to date  Pneumococcal vaccine status: Up to date  Covid-19 vaccine status: Completed vaccines  Qualifies for Shingles Vaccine? Yes   Zostavax completed No   Shingrix Completed?: Yes  Screening Tests Health Maintenance  Topic Date Due   COVID-19 Vaccine (7 - 2023-24 season) 06/14/2022   COLONOSCOPY (Pts 45-28yr Insurance coverage will need to be confirmed)  04/05/2023   Medicare Annual Wellness (AWV)  07/30/2023   DTaP/Tdap/Td (3 - Td or Tdap) 09/17/2031   Pneumonia Vaccine 72 Years old  Completed   INFLUENZA VACCINE  Completed   Hepatitis C Screening  Completed   Zoster Vaccines- Shingrix  Completed   HPV VACCINES  Aged Out    Health Maintenance  Health Maintenance Due  Topic Date Due   COVID-19 Vaccine (7 - 2023-24 season) 06/14/2022    Colorectal cancer screening: Type of screening: Colonoscopy. Completed 04/04/2018. Repeat every 5 years  Lung  Cancer Screening: (Low Dose CT Chest recommended if Age 72-80years, 30 pack-year currently smoking OR have quit w/in 15years.) does not qualify.   Lung Cancer Screening Referral: no  Additional Screening:  Hepatitis C Screening: does qualify; Completed: 08/08/2013  Vision Screening: Recommended annual ophthalmology exams for early detection of glaucoma and other disorders of the eye. Is the patient up to date with their annual eye exam?  Yes  Who is the provider or what is the name of the office in which the patient attends annual eye exams? [facility] pt is not established with a provider, would they like to be referred to a provider to establish care? No .   Dental Screening: Recommended annual dental exams for proper oral hygiene  Community Resource Referral / Chronic Care Management: CRR required this visit?  No   CCM required this visit?  No      Plan:     I have personally reviewed and noted the following in the  patient's chart:   Medical and social history Use of alcohol, tobacco or illicit drugs  Current medications and supplements including opioid prescriptions. Patient is not currently taking opioid prescriptions. Functional ability and status Nutritional status Physical activity Advanced directives List of other physicians Hospitalizations, surgeries, and ER visits in previous 12 months Vitals Screenings to include cognitive, depression, and falls Referrals and appointments  In addition, I have reviewed and discussed with patient certain preventive protocols, quality metrics, and best practice recommendations. A written personalized care plan for preventive services as well as general preventive health recommendations were provided to patient.     Sheral Flow, LPN   12/19/735   Nurse Notes: n/a   Medical screening examination/treatment/procedure(s) were performed by non-physician practitioner and as supervising physician I was  immediately available for consultation/collaboration.  I agree with above. Lew Dawes, MD

## 2022-08-05 DIAGNOSIS — H6122 Impacted cerumen, left ear: Secondary | ICD-10-CM | POA: Diagnosis not present

## 2022-08-05 DIAGNOSIS — B368 Other specified superficial mycoses: Secondary | ICD-10-CM | POA: Diagnosis not present

## 2022-08-05 DIAGNOSIS — H6242 Otitis externa in other diseases classified elsewhere, left ear: Secondary | ICD-10-CM | POA: Diagnosis not present

## 2022-08-09 ENCOUNTER — Encounter: Payer: Self-pay | Admitting: Internal Medicine

## 2022-08-17 LAB — COLOGUARD

## 2022-09-14 ENCOUNTER — Encounter: Payer: Self-pay | Admitting: Internal Medicine

## 2022-09-14 ENCOUNTER — Ambulatory Visit (INDEPENDENT_AMBULATORY_CARE_PROVIDER_SITE_OTHER): Payer: Medicare HMO | Admitting: Internal Medicine

## 2022-09-14 ENCOUNTER — Other Ambulatory Visit (INDEPENDENT_AMBULATORY_CARE_PROVIDER_SITE_OTHER): Payer: Medicare HMO

## 2022-09-14 VITALS — BP 120/60 | HR 60 | Temp 98.3°F | Ht 68.0 in | Wt 189.0 lb

## 2022-09-14 DIAGNOSIS — E785 Hyperlipidemia, unspecified: Secondary | ICD-10-CM | POA: Diagnosis not present

## 2022-09-14 DIAGNOSIS — R911 Solitary pulmonary nodule: Secondary | ICD-10-CM | POA: Diagnosis not present

## 2022-09-14 DIAGNOSIS — H60312 Diffuse otitis externa, left ear: Secondary | ICD-10-CM | POA: Diagnosis not present

## 2022-09-14 DIAGNOSIS — R69 Illness, unspecified: Secondary | ICD-10-CM | POA: Diagnosis not present

## 2022-09-14 DIAGNOSIS — I2510 Atherosclerotic heart disease of native coronary artery without angina pectoris: Secondary | ICD-10-CM

## 2022-09-14 DIAGNOSIS — E559 Vitamin D deficiency, unspecified: Secondary | ICD-10-CM | POA: Diagnosis not present

## 2022-09-14 DIAGNOSIS — E039 Hypothyroidism, unspecified: Secondary | ICD-10-CM

## 2022-09-14 DIAGNOSIS — J449 Chronic obstructive pulmonary disease, unspecified: Secondary | ICD-10-CM | POA: Diagnosis not present

## 2022-09-14 DIAGNOSIS — F411 Generalized anxiety disorder: Secondary | ICD-10-CM

## 2022-09-14 DIAGNOSIS — I2583 Coronary atherosclerosis due to lipid rich plaque: Secondary | ICD-10-CM | POA: Diagnosis not present

## 2022-09-14 LAB — LDL CHOLESTEROL, DIRECT: Direct LDL: 36 mg/dL

## 2022-09-14 LAB — COMPREHENSIVE METABOLIC PANEL
ALT: 30 U/L (ref 0–53)
AST: 39 U/L — ABNORMAL HIGH (ref 0–37)
Albumin: 4 g/dL (ref 3.5–5.2)
Alkaline Phosphatase: 39 U/L (ref 39–117)
BUN: 12 mg/dL (ref 6–23)
CO2: 26 mEq/L (ref 19–32)
Calcium: 9.4 mg/dL (ref 8.4–10.5)
Chloride: 102 mEq/L (ref 96–112)
Creatinine, Ser: 0.91 mg/dL (ref 0.40–1.50)
GFR: 84.76 mL/min (ref >60.00)
Glucose, Bld: 94 mg/dL (ref 70–99)
Potassium: 4.1 mEq/L (ref 3.5–5.1)
Sodium: 135 mEq/L (ref 135–145)
Total Bilirubin: 0.5 mg/dL (ref 0.2–1.2)
Total Protein: 7.9 g/dL (ref 6.0–8.3)

## 2022-09-14 LAB — LIPID PANEL
Cholesterol: 128 mg/dL (ref 0–200)
HDL: 44.9 mg/dL (ref >39.00)
NonHDL: 82.87
Total CHOL/HDL Ratio: 3
Triglycerides: 224 mg/dL — ABNORMAL HIGH (ref 0.0–149.0)
VLDL: 44.8 mg/dL — ABNORMAL HIGH (ref 0.0–40.0)

## 2022-09-14 NOTE — Assessment & Plan Note (Signed)
On Vit D 

## 2022-09-14 NOTE — Assessment & Plan Note (Signed)
Cont on  Pravastatin, Zetia, Fish oil No CP

## 2022-09-14 NOTE — Progress Notes (Signed)
Subjective:  Patient ID: [name], male    DOB: [birth year]  Age: 72 y.o. MRN: [id number]  CC: Follow-up (51mth f/u)   HPI [name] presents for OE - on CASH powder Rx F/u CAD, COPD  GF is ill w/COPD stage 4; on O2 4 l/min   Outpatient Medications Prior to Visit  Medication Sig Dispense Refill   aspirin 81 MG tablet Take 81 mg by mouth daily.     cholecalciferol (VITAMIN D) 1000 UNITS tablet Take 2,000 Units by mouth daily.     diazepam (VALIUM) 10 MG tablet TAKE 1/2 TO 1 (ONE-HALF TO ONE) TABLET BY MOUTH TWICE DAILY AS NEEDED FOR ANXIETY 60 tablet 3   ezetimibe (ZETIA) 10 MG tablet Take 1 tablet (10 mg total) by mouth daily. 90 tablet 3   fenofibrate (TRICOR) 48 MG tablet Take 1 tablet (48 mg total) by mouth daily. 30 tablet 11   levothyroxine (SYNTHROID) 88 MCG tablet Take 1 tablet (88 mcg total) by mouth daily before breakfast. 90 tablet 3   losartan (COZAAR) 100 MG tablet Take 1 tablet (100 mg total) by mouth daily. 90 tablet 3   nitroGLYCERIN (NITROSTAT) 0.4 MG SL tablet DISSOLVE ONE TABLET UNDER THE TONGUE EVERY 5 MINUTES AS NEEDED FOR CHEST PAIN.  DO NOT EXCEED A TOTAL OF 3 DOSES IN 15 MINUTES 20 tablet 2   Omega-3 Fatty Acids (FISH OIL) 1000 MG CAPS Take 2 capsules (2,000 mg total) by mouth daily. 100 capsule 3   pantoprazole (PROTONIX) 40 MG tablet Take 1 tablet (40 mg total) by mouth daily as needed. 90 tablet 3   pravastatin (PRAVACHOL) 20 MG tablet Take 1 tablet (20 mg total) by mouth daily. 90 tablet 3   No facility-administered medications prior to visit.    ROS: Review of Systems  Constitutional:  Negative for appetite change, fatigue and unexpected weight change.  HENT:  Negative for congestion, nosebleeds, sneezing, sore throat and trouble swallowing.   Eyes:  Negative for itching and visual disturbance.  Respiratory:  Negative for cough.   Cardiovascular:  Negative for chest pain, palpitations and leg swelling.  Gastrointestinal:  Negative for  abdominal distention, blood in stool, diarrhea and nausea.  Genitourinary:  Negative for frequency and hematuria.  Musculoskeletal:  Negative for back pain, gait problem, joint swelling and neck pain.  Skin:  Negative for rash.  Neurological:  Negative for dizziness, tremors, speech difficulty and weakness.  Psychiatric/Behavioral:  Negative for agitation, dysphoric mood and sleep disturbance. The patient is not nervous/anxious.     Objective:  BP 120/60 (BP Location: Left Arm, Patient Position: Sitting, Cuff Size: Normal)   Pulse 60   Temp 98.3 F (36.8 C) (Oral)   Ht '5\' 8"'$  (1.727 m)   Wt 189 lb (85.7 kg)   SpO2 98%   BMI 28.74 kg/m   BP Readings from Last 3 Encounters:  09/14/22 120/60  06/14/22 120/68  03/22/22 120/68    Wt Readings from Last 3 Encounters:  09/14/22 189 lb (85.7 kg)  07/29/22 184 lb (83.5 kg)  06/14/22 188 lb 3.2 oz (85.4 kg)    Physical Exam Constitutional:      General: He is not in acute distress.    Appearance: Normal appearance. He is well-developed.     Comments: NAD  Eyes:     Conjunctiva/sclera: Conjunctivae normal.     Pupils: Pupils are equal, round, and reactive to light.  Neck:     Thyroid: No thyromegaly.  Vascular: No JVD.  Cardiovascular:     Rate and Rhythm: Normal rate and regular rhythm.     Heart sounds: Normal heart sounds. No murmur heard.    No friction rub. No gallop.  Pulmonary:     Effort: Pulmonary effort is normal. No respiratory distress.     Breath sounds: Normal breath sounds. No wheezing or rales.  Chest:     Chest wall: No tenderness.  Abdominal:     General: Bowel sounds are normal. There is no distension.     Palpations: Abdomen is soft. There is no mass.     Tenderness: There is no abdominal tenderness. There is no guarding or rebound.  Musculoskeletal:        General: No tenderness. Normal range of motion.     Cervical back: Normal range of motion.  Lymphadenopathy:     Cervical: No cervical  adenopathy.  Skin:    General: Skin is warm and dry.     Findings: No rash.  Neurological:     Mental Status: He is alert and oriented to person, place, and time.     Cranial Nerves: No cranial nerve deficit.     Motor: No abnormal muscle tone.     Coordination: Coordination normal.     Gait: Gait normal.     Deep Tendon Reflexes: Reflexes are normal and symmetric.  Psychiatric:        Behavior: Behavior normal.        Thought Content: Thought content normal.        Judgment: Judgment normal.    L ear is better   Lab Results  Component Value Date   WBC 5.5 06/14/2022   HGB 12.8 (L) 06/14/2022   HCT 37.9 (L) 06/14/2022   PLT 172.0 06/14/2022   GLUCOSE 99 06/14/2022   CHOL 146 06/14/2022   TRIG (H) 06/14/2022    477.0 Triglyceride is over 400; calculations on Lipids are invalid.   HDL 32.90 (L) 06/14/2022   LDLDIRECT 23.0 06/14/2022   LDLCALC 59 03/01/2012   ALT 45 06/14/2022   AST 44 (H) 06/14/2022   NA 136 06/14/2022   K 4.0 06/14/2022   CL 103 06/14/2022   CREATININE 0.85 06/14/2022   BUN 10 06/14/2022   CO2 26 06/14/2022   TSH 4.44 06/14/2022   PSA 1.49 06/14/2022    CT Chest Wo Contrast  Result Date: 03/20/2020 CLINICAL DATA:  COPD, previous tobacco abuse, pulmonary nodule EXAM: CT CHEST WITHOUT CONTRAST TECHNIQUE: Multidetector CT imaging of the chest was performed following the standard protocol without IV contrast. COMPARISON:  11/30/2017 FINDINGS: Cardiovascular: Unenhanced imaging of the heart and great vessels demonstrates no pericardial effusion. 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 Mild atherosclerosis of the aortic arch. Normal caliber of the thoracic aorta. Mediastinum/Nodes: No enlarged mediastinal or axillary lymph nodes. Thyroid gland, trachea, and esophagus demonstrate no significant findings. Lungs/Pleura: Diffuse upper lobe predominant emphysema. No acute airspace disease, effusion, or pneumothorax. Multiple small  noncalcified pulmonary nodules are again identified, unchanged since prior exam. Index nodules are as follows: Right upper lobe, image 31, 6 mm.  Stable. Left lower lobe, image 116, 4 mm.  Stable. A right middle lobe 3 mm nodule seen previously has calcified in the interim, consistent with granuloma. No new nodules or masses. Central airways are patent. Upper Abdomen: No acute abnormality. Musculoskeletal: No acute or destructive bony lesions. Reconstructed images demonstrate no additional findings. IMPRESSION: 1. Stable subcentimeter pulmonary nodules as above, largest measuring 6 mm.  Given the greater than 2 year stability, no further imaging workup is required. This recommendation follows the consensus statement: Guidelines for Management of Incidental Pulmonary Nodules Detected on CT Images: From the Fleischner Society 2017; Radiology 2017; 284:228-243. 2. No acute intrathoracic process. 3. Aortic Atherosclerosis (ICD10-I70.0) and Emphysema (ICD10-J43.9). Electronically Signed   By: Randa Ngo M.D.   On: 03/20/2020 23:42    Assessment & Plan:   Problem List Items Addressed This Visit       Cardiovascular and Mediastinum   CAD (coronary artery disease)    Cont on  Pravastatin, Zetia, Fish oil No CP        Respiratory   Pulmonary nodule    Stable CT in 2021      COPD (chronic obstructive pulmonary disease) (HCC)    Doing well        Endocrine   Hypothyroidism    Cont on Levothroid        Nervous and Auditory   Otitis externa - Primary    Dr Redmond Baseman: pt is using CASH powder for the fungus        Other   Vitamin D deficiency    On Vit D      Anxiety disorder    GF is ill w/COPD stage 4; on O2 4 l/min         No orders of the defined types were placed in this encounter.     Follow-up: Return in about 3 months (around 12/15/2022) for a follow-up visit.  Walker Kehr, MD

## 2022-09-14 NOTE — Assessment & Plan Note (Signed)
GF is ill w/COPD stage 4; on O2 4 l/min

## 2022-09-14 NOTE — Assessment & Plan Note (Signed)
Stable CT in 2021

## 2022-09-14 NOTE — Assessment & Plan Note (Signed)
Doing well 

## 2022-09-14 NOTE — Assessment & Plan Note (Signed)
Cont on Levothroid 

## 2022-09-14 NOTE — Assessment & Plan Note (Signed)
Dr Redmond Baseman: pt is using CASH powder for the fungus

## 2022-09-16 DIAGNOSIS — H6240 Otitis externa in other diseases classified elsewhere, unspecified ear: Secondary | ICD-10-CM | POA: Diagnosis not present

## 2022-09-16 DIAGNOSIS — B369 Superficial mycosis, unspecified: Secondary | ICD-10-CM | POA: Diagnosis not present

## 2022-09-16 DIAGNOSIS — H7292 Unspecified perforation of tympanic membrane, left ear: Secondary | ICD-10-CM | POA: Diagnosis not present

## 2022-09-28 DIAGNOSIS — Z1211 Encounter for screening for malignant neoplasm of colon: Secondary | ICD-10-CM | POA: Diagnosis not present

## 2022-10-11 LAB — COLOGUARD: COLOGUARD: NEGATIVE

## 2022-10-14 DIAGNOSIS — H7292 Unspecified perforation of tympanic membrane, left ear: Secondary | ICD-10-CM | POA: Diagnosis not present

## 2022-10-14 DIAGNOSIS — B368 Other specified superficial mycoses: Secondary | ICD-10-CM | POA: Diagnosis not present

## 2022-10-14 DIAGNOSIS — H6122 Impacted cerumen, left ear: Secondary | ICD-10-CM | POA: Diagnosis not present

## 2022-10-14 DIAGNOSIS — H6242 Otitis externa in other diseases classified elsewhere, left ear: Secondary | ICD-10-CM | POA: Diagnosis not present

## 2022-12-20 DIAGNOSIS — H6122 Impacted cerumen, left ear: Secondary | ICD-10-CM | POA: Diagnosis not present

## 2022-12-20 DIAGNOSIS — H7292 Unspecified perforation of tympanic membrane, left ear: Secondary | ICD-10-CM | POA: Diagnosis not present

## 2022-12-20 DIAGNOSIS — B369 Superficial mycosis, unspecified: Secondary | ICD-10-CM | POA: Diagnosis not present

## 2022-12-20 DIAGNOSIS — H6242 Otitis externa in other diseases classified elsewhere, left ear: Secondary | ICD-10-CM | POA: Diagnosis not present

## 2022-12-30 ENCOUNTER — Ambulatory Visit (INDEPENDENT_AMBULATORY_CARE_PROVIDER_SITE_OTHER): Payer: Medicare HMO | Admitting: Internal Medicine

## 2022-12-30 ENCOUNTER — Encounter: Payer: Self-pay | Admitting: Internal Medicine

## 2022-12-30 VITALS — BP 110/60 | HR 60 | Temp 98.3°F | Ht 68.0 in | Wt 184.0 lb

## 2022-12-30 DIAGNOSIS — R3912 Poor urinary stream: Secondary | ICD-10-CM | POA: Diagnosis not present

## 2022-12-30 DIAGNOSIS — I2510 Atherosclerotic heart disease of native coronary artery without angina pectoris: Secondary | ICD-10-CM | POA: Diagnosis not present

## 2022-12-30 DIAGNOSIS — E782 Mixed hyperlipidemia: Secondary | ICD-10-CM

## 2022-12-30 DIAGNOSIS — I2583 Coronary atherosclerosis due to lipid rich plaque: Secondary | ICD-10-CM | POA: Diagnosis not present

## 2022-12-30 DIAGNOSIS — N401 Enlarged prostate with lower urinary tract symptoms: Secondary | ICD-10-CM | POA: Diagnosis not present

## 2022-12-30 DIAGNOSIS — F411 Generalized anxiety disorder: Secondary | ICD-10-CM | POA: Diagnosis not present

## 2022-12-30 DIAGNOSIS — N400 Enlarged prostate without lower urinary tract symptoms: Secondary | ICD-10-CM | POA: Insufficient documentation

## 2022-12-30 MED ORDER — DIAZEPAM 10 MG PO TABS: ORAL_TABLET | ORAL | 3 refills | Status: DC

## 2022-12-30 MED ORDER — FINASTERIDE 5 MG PO TABS: 5.0000 mg | ORAL_TABLET | Freq: Every day | ORAL | 3 refills | Status: DC

## 2022-12-30 NOTE — Assessment & Plan Note (Signed)
Continue on Diazepam prn  Potential benefits of a long term benzodiazepines  use as well as potential risks  and complications were explained to the patient and were aknowledged. Not using Rx w/alcohol 

## 2022-12-30 NOTE — Assessment & Plan Note (Addendum)
Cont on  Pravastatin, Zetia, Fish oil 

## 2022-12-30 NOTE — Progress Notes (Signed)
Subjective:  Patient ID: [name], male    DOB: [birth year]  Age: 72 y.o. MRN: [id number]  CC: Follow-up (3 mnth f/u)   HPI [name] presents for anxiety, BPH, HTN  Outpatient Medications Prior to Visit  Medication Sig Dispense Refill   aspirin 81 MG tablet Take 81 mg by mouth daily.     cholecalciferol (VITAMIN D) 1000 UNITS tablet Take 2,000 Units by mouth daily.     ezetimibe (ZETIA) 10 MG tablet Take 1 tablet (10 mg total) by mouth daily. 90 tablet 3   fenofibrate (TRICOR) 48 MG tablet Take 1 tablet (48 mg total) by mouth daily. 30 tablet 11   levothyroxine (SYNTHROID) 88 MCG tablet Take 1 tablet (88 mcg total) by mouth daily before breakfast. 90 tablet 3   losartan (COZAAR) 100 MG tablet Take 1 tablet (100 mg total) by mouth daily. 90 tablet 3   nitroGLYCERIN (NITROSTAT) 0.4 MG SL tablet DISSOLVE ONE TABLET UNDER THE TONGUE EVERY 5 MINUTES AS NEEDED FOR CHEST PAIN.  DO NOT EXCEED A TOTAL OF 3 DOSES IN 15 MINUTES 20 tablet 2   Omega-3 Fatty Acids (FISH OIL) 1000 MG CAPS Take 2 capsules (2,000 mg total) by mouth daily. 100 capsule 3   pantoprazole (PROTONIX) 40 MG tablet Take 1 tablet (40 mg total) by mouth daily as needed. 90 tablet 3   pravastatin (PRAVACHOL) 20 MG tablet Take 1 tablet (20 mg total) by mouth daily. 90 tablet 3   diazepam (VALIUM) 10 MG tablet TAKE 1/2 TO 1 (ONE-HALF TO ONE) TABLET BY MOUTH TWICE DAILY AS NEEDED FOR ANXIETY 60 tablet 3   No facility-administered medications prior to visit.    ROS: Review of Systems  Constitutional:  Negative for appetite change, fatigue and unexpected weight change.  HENT:  Negative for congestion, nosebleeds, sneezing, sore throat and trouble swallowing.   Eyes:  Negative for itching and visual disturbance.  Respiratory:  Negative for cough.   Cardiovascular:  Negative for chest pain, palpitations and leg swelling.  Gastrointestinal:  Negative for abdominal distention, blood in stool, diarrhea and nausea.   Genitourinary:  Positive for decreased urine volume and frequency. Negative for hematuria.  Musculoskeletal:  Negative for back pain, gait problem, joint swelling and neck pain.  Skin:  Negative for rash.  Neurological:  Negative for dizziness, tremors, speech difficulty and weakness.  Psychiatric/Behavioral:  Negative for agitation, dysphoric mood, sleep disturbance and suicidal ideas. The patient is nervous/anxious.     Objective:  BP 110/60 (BP Location: Right Arm, Patient Position: Sitting, Cuff Size: Large)   Pulse 60   Temp 98.3 F (36.8 C) (Oral)   Ht 5\' 8"  (1.727 m)   Wt 184 lb (83.5 kg)   SpO2 94%   BMI 27.98 kg/m   BP Readings from Last 3 Encounters:  12/30/22 110/60  09/14/22 120/60  06/14/22 120/68    Wt Readings from Last 3 Encounters:  12/30/22 184 lb (83.5 kg)  09/14/22 189 lb (85.7 kg)  07/29/22 184 lb (83.5 kg)    Physical Exam Constitutional:      General: He is not in acute distress.    Appearance: Normal appearance. He is well-developed.     Comments: NAD  Eyes:     Conjunctiva/sclera: Conjunctivae normal.     Pupils: Pupils are equal, round, and reactive to light.  Neck:     Thyroid: No thyromegaly.     Vascular: No JVD.  Cardiovascular:  Rate and Rhythm: Normal rate and regular rhythm.     Heart sounds: Normal heart sounds. No murmur heard.    No friction rub. No gallop.  Pulmonary:     Effort: Pulmonary effort is normal. No respiratory distress.     Breath sounds: Normal breath sounds. No wheezing or rales.  Chest:     Chest wall: No tenderness.  Abdominal:     General: Bowel sounds are normal. There is no distension.     Palpations: Abdomen is soft. There is no mass.     Tenderness: There is no abdominal tenderness. There is no guarding or rebound.  Musculoskeletal:        General: No tenderness. Normal range of motion.     Cervical back: Normal range of motion.  Lymphadenopathy:     Cervical: No cervical adenopathy.  Skin:     General: Skin is warm and dry.     Findings: No rash.  Neurological:     Mental Status: He is alert and oriented to person, place, and time.     Cranial Nerves: No cranial nerve deficit.     Motor: No abnormal muscle tone.     Coordination: Coordination normal.     Gait: Gait normal.     Deep Tendon Reflexes: Reflexes are normal and symmetric.  Psychiatric:        Behavior: Behavior normal.        Thought Content: Thought content normal.        Judgment: Judgment normal.     Lab Results  Component Value Date   WBC 5.5 06/14/2022   HGB 12.8 (L) 06/14/2022   HCT 37.9 (L) 06/14/2022   PLT 172.0 06/14/2022   GLUCOSE 94 09/14/2022   CHOL 128 09/14/2022   TRIG 224.0 (H) 09/14/2022   HDL 44.90 09/14/2022   LDLDIRECT 36.0 09/14/2022   LDLCALC 59 03/01/2012   ALT 30 09/14/2022   AST 39 (H) 09/14/2022   NA 135 09/14/2022   K 4.1 09/14/2022   CL 102 09/14/2022   CREATININE 0.91 09/14/2022   BUN 12 09/14/2022   CO2 26 09/14/2022   TSH 4.44 06/14/2022   PSA 1.49 06/14/2022    CT Chest Wo Contrast  Result Date: 03/20/2020 CLINICAL DATA:  COPD, previous tobacco abuse, pulmonary nodule EXAM: CT CHEST WITHOUT CONTRAST TECHNIQUE: Multidetector CT imaging of the chest was performed following the standard protocol without IV contrast. COMPARISON:  11/30/2017 FINDINGS: Cardiovascular: Unenhanced imaging of the heart and great vessels demonstrates no pericardial effusion. 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 Mild atherosclerosis of the aortic arch. Normal caliber of the thoracic aorta. Mediastinum/Nodes: No enlarged mediastinal or axillary lymph nodes. Thyroid gland, trachea, and esophagus demonstrate no significant findings. Lungs/Pleura: Diffuse upper lobe predominant emphysema. No acute airspace disease, effusion, or pneumothorax. Multiple small noncalcified pulmonary nodules are again identified, unchanged since prior exam. Index nodules are as follows: Right upper  lobe, image 31, 6 mm.  Stable. Left lower lobe, image 116, 4 mm.  Stable. A right middle lobe 3 mm nodule seen previously has calcified in the interim, consistent with granuloma. No new nodules or masses. Central airways are patent. Upper Abdomen: No acute abnormality. Musculoskeletal: No acute or destructive bony lesions. Reconstructed images demonstrate no additional findings. IMPRESSION: 1. Stable subcentimeter pulmonary nodules as above, largest measuring 6 mm. Given the greater than 2 year stability, no further imaging workup is required. This recommendation follows the consensus statement: Guidelines for Management of Incidental Pulmonary Nodules Detected  on CT Images: From the Fleischner Society 2017; Radiology 2017; 284:228-243. 2. No acute intrathoracic process. 3. Aortic Atherosclerosis (ICD10-I70.0) and Emphysema (ICD10-J43.9). Electronically Signed   By: Sharlet Salina M.D.   On: 03/20/2020 23:42    Assessment & Plan:   Problem List Items Addressed This Visit     Mixed hyperlipidemia    Cont on  Pravastatin, Zetia, Fish oil      Anxiety disorder - Primary    Continue on Diazepam prn  Potential benefits of a long term benzodiazepines  use as well as potential risks  and complications were explained to the patient and were aknowledged. Not using Rx w/alcohol      Relevant Medications   diazepam (VALIUM) 10 MG tablet   CAD (coronary artery disease)    Cont on  Pravastatin, Zetia, Fish oil No CP      BPH (benign prostatic hyperplasia)    Worse Start Proscar PSA q 12 months       Relevant Medications   finasteride (PROSCAR) 5 MG tablet      Meds ordered this encounter  Medications   finasteride (PROSCAR) 5 MG tablet    Sig: Take 1 tablet (5 mg total) by mouth daily.    Dispense:  100 tablet    Refill:  3   diazepam (VALIUM) 10 MG tablet    Sig: TAKE 1/2 TO 1 (ONE-HALF TO ONE) TABLET BY MOUTH TWICE DAILY AS NEEDED FOR ANXIETY    Dispense:  60 tablet    Refill:  3       Follow-up: Return in about 3 months (around 04/01/2023) for a follow-up visit.  Sonda Primes, MD

## 2022-12-30 NOTE — Assessment & Plan Note (Signed)
Cont on  Pravastatin, Zetia, Fish oil No CP 

## 2022-12-30 NOTE — Assessment & Plan Note (Signed)
Worse Start Proscar PSA q 12 months

## 2023-01-14 DIAGNOSIS — I25119 Atherosclerotic heart disease of native coronary artery with unspecified angina pectoris: Secondary | ICD-10-CM | POA: Diagnosis not present

## 2023-01-14 DIAGNOSIS — N400 Enlarged prostate without lower urinary tract symptoms: Secondary | ICD-10-CM | POA: Diagnosis not present

## 2023-01-14 DIAGNOSIS — K219 Gastro-esophageal reflux disease without esophagitis: Secondary | ICD-10-CM | POA: Diagnosis not present

## 2023-01-14 DIAGNOSIS — F325 Major depressive disorder, single episode, in full remission: Secondary | ICD-10-CM | POA: Diagnosis not present

## 2023-01-14 DIAGNOSIS — J439 Emphysema, unspecified: Secondary | ICD-10-CM | POA: Diagnosis not present

## 2023-01-14 DIAGNOSIS — E785 Hyperlipidemia, unspecified: Secondary | ICD-10-CM | POA: Diagnosis not present

## 2023-01-14 DIAGNOSIS — M1990 Unspecified osteoarthritis, unspecified site: Secondary | ICD-10-CM | POA: Diagnosis not present

## 2023-01-14 DIAGNOSIS — I10 Essential (primary) hypertension: Secondary | ICD-10-CM | POA: Diagnosis not present

## 2023-01-14 DIAGNOSIS — F411 Generalized anxiety disorder: Secondary | ICD-10-CM | POA: Diagnosis not present

## 2023-01-14 DIAGNOSIS — E039 Hypothyroidism, unspecified: Secondary | ICD-10-CM | POA: Diagnosis not present

## 2023-02-01 NOTE — Progress Notes (Signed)
Completed.

## 2023-02-09 DIAGNOSIS — H2513 Age-related nuclear cataract, bilateral: Secondary | ICD-10-CM | POA: Diagnosis not present

## 2023-02-09 DIAGNOSIS — H524 Presbyopia: Secondary | ICD-10-CM | POA: Diagnosis not present

## 2023-02-10 DIAGNOSIS — H52222 Regular astigmatism, left eye: Secondary | ICD-10-CM | POA: Diagnosis not present

## 2023-02-10 DIAGNOSIS — H524 Presbyopia: Secondary | ICD-10-CM | POA: Diagnosis not present

## 2023-02-17 ENCOUNTER — Encounter (INDEPENDENT_AMBULATORY_CARE_PROVIDER_SITE_OTHER): Payer: Self-pay

## 2023-02-22 DIAGNOSIS — H7292 Unspecified perforation of tympanic membrane, left ear: Secondary | ICD-10-CM | POA: Diagnosis not present

## 2023-04-04 ENCOUNTER — Ambulatory Visit (INDEPENDENT_AMBULATORY_CARE_PROVIDER_SITE_OTHER): Payer: Medicare HMO | Admitting: Internal Medicine

## 2023-04-04 ENCOUNTER — Encounter: Payer: Self-pay | Admitting: Internal Medicine

## 2023-04-04 VITALS — BP 126/68 | HR 54 | Temp 97.7°F | Ht 68.0 in | Wt 188.6 lb

## 2023-04-04 DIAGNOSIS — D649 Anemia, unspecified: Secondary | ICD-10-CM | POA: Insufficient documentation

## 2023-04-04 DIAGNOSIS — I2583 Coronary atherosclerosis due to lipid rich plaque: Secondary | ICD-10-CM

## 2023-04-04 DIAGNOSIS — R3912 Poor urinary stream: Secondary | ICD-10-CM

## 2023-04-04 DIAGNOSIS — E782 Mixed hyperlipidemia: Secondary | ICD-10-CM

## 2023-04-04 DIAGNOSIS — D508 Other iron deficiency anemias: Secondary | ICD-10-CM | POA: Diagnosis not present

## 2023-04-04 DIAGNOSIS — N401 Enlarged prostate with lower urinary tract symptoms: Secondary | ICD-10-CM | POA: Diagnosis not present

## 2023-04-04 DIAGNOSIS — R972 Elevated prostate specific antigen [PSA]: Secondary | ICD-10-CM

## 2023-04-04 DIAGNOSIS — I2510 Atherosclerotic heart disease of native coronary artery without angina pectoris: Secondary | ICD-10-CM

## 2023-04-04 NOTE — Assessment & Plan Note (Signed)
Better On Proscar PSA q 12 months

## 2023-04-04 NOTE — Assessment & Plan Note (Signed)
Cont on  Pravastatin, Zetia, Fish oil No CP

## 2023-04-04 NOTE — Assessment & Plan Note (Signed)
Cont on  Pravastatin, Zetia, Fish oil 

## 2023-04-04 NOTE — Assessment & Plan Note (Signed)
Continue to monitor PSA

## 2023-04-04 NOTE — Progress Notes (Signed)
Subjective:  Patient ID: [name], male    DOB: 1951-06-23  Age: 72 y.o. MRN: [id number]  CC: Medical Management of Chronic Issues (3 month f/u. Patient would like for you to check a spot on the top of his head. )   HPI [name] presents for anxiety, dyslipidemia, atherosclerosis  Outpatient Medications Prior to Visit  Medication Sig Dispense Refill   aspirin 81 MG tablet Take 81 mg by mouth daily.     cholecalciferol (VITAMIN D) 1000 UNITS tablet Take 2,000 Units by mouth daily.     diazepam (VALIUM) 10 MG tablet TAKE 1/2 TO 1 (ONE-HALF TO ONE) TABLET BY MOUTH TWICE DAILY AS NEEDED FOR ANXIETY 60 tablet 3   ezetimibe (ZETIA) 10 MG tablet Take 1 tablet (10 mg total) by mouth daily. 90 tablet 3   fenofibrate (TRICOR) 48 MG tablet Take 1 tablet (48 mg total) by mouth daily. 30 tablet 11   finasteride (PROSCAR) 5 MG tablet Take 1 tablet (5 mg total) by mouth daily. 100 tablet 3   levothyroxine (SYNTHROID) 88 MCG tablet Take 1 tablet (88 mcg total) by mouth daily before breakfast. 90 tablet 3   losartan (COZAAR) 100 MG tablet Take 1 tablet (100 mg total) by mouth daily. 90 tablet 3   nitroGLYCERIN (NITROSTAT) 0.4 MG SL tablet DISSOLVE ONE TABLET UNDER THE TONGUE EVERY 5 MINUTES AS NEEDED FOR CHEST PAIN.  DO NOT EXCEED A TOTAL OF 3 DOSES IN 15 MINUTES 20 tablet 2   Omega-3 Fatty Acids (FISH OIL) 1000 MG CAPS Take 2 capsules (2,000 mg total) by mouth daily. 100 capsule 3   pantoprazole (PROTONIX) 40 MG tablet Take 1 tablet (40 mg total) by mouth daily as needed. 90 tablet 3   pravastatin (PRAVACHOL) 20 MG tablet Take 1 tablet (20 mg total) by mouth daily. 90 tablet 3   No facility-administered medications prior to visit.    ROS: Review of Systems  Constitutional:  Negative for appetite change, fatigue and unexpected weight change.  HENT:  Negative for congestion, nosebleeds, sneezing, sore throat and trouble swallowing.   Eyes:  Negative for itching and visual disturbance.   Respiratory:  Negative for cough.   Cardiovascular:  Negative for chest pain, palpitations and leg swelling.  Gastrointestinal:  Negative for abdominal distention, blood in stool, diarrhea and nausea.  Genitourinary:  Negative for frequency and hematuria.  Musculoskeletal:  Positive for arthralgias. Negative for back pain, gait problem, joint swelling and neck pain.  Skin:  Negative for rash.  Neurological:  Negative for dizziness, tremors, speech difficulty and weakness.  Psychiatric/Behavioral:  Negative for agitation, dysphoric mood and sleep disturbance. The patient is nervous/anxious.     Objective:  BP 126/68 (BP Location: Left Arm, Patient Position: Sitting, Cuff Size: Normal)   Pulse (!) 54   Temp 97.7 F (36.5 C) (Oral)   Ht 5\' 8"  (1.727 m)   Wt 188 lb 9.6 oz (85.5 kg)   SpO2 96%   BMI 28.68 kg/m   BP Readings from Last 3 Encounters:  04/04/23 126/68  12/30/22 110/60  09/14/22 120/60    Wt Readings from Last 3 Encounters:  04/04/23 188 lb 9.6 oz (85.5 kg)  12/30/22 184 lb (83.5 kg)  09/14/22 189 lb (85.7 kg)    Physical Exam Constitutional:      General: He is not in acute distress.    Appearance: He is well-developed.     Comments: NAD  Eyes:  Conjunctiva/sclera: Conjunctivae normal.     Pupils: Pupils are equal, round, and reactive to light.  Neck:     Thyroid: No thyromegaly.     Vascular: No JVD.  Cardiovascular:     Rate and Rhythm: Normal rate and regular rhythm.     Heart sounds: Normal heart sounds. No murmur heard.    No friction rub. No gallop.  Pulmonary:     Effort: Pulmonary effort is normal. No respiratory distress.     Breath sounds: Normal breath sounds. No wheezing or rales.  Chest:     Chest wall: No tenderness.  Abdominal:     General: Bowel sounds are normal. There is no distension.     Palpations: Abdomen is soft. There is no mass.     Tenderness: There is no abdominal tenderness. There is no guarding or rebound.   Musculoskeletal:        General: No tenderness. Normal range of motion.     Cervical back: Normal range of motion.  Lymphadenopathy:     Cervical: No cervical adenopathy.  Skin:    General: Skin is warm and dry.     Findings: No rash.  Neurological:     Mental Status: He is alert and oriented to person, place, and time.     Cranial Nerves: No cranial nerve deficit.     Motor: No abnormal muscle tone.     Coordination: Coordination normal.     Gait: Gait normal.     Deep Tendon Reflexes: Reflexes are normal and symmetric.  Psychiatric:        Behavior: Behavior normal.        Thought Content: Thought content normal.        Judgment: Judgment normal.     Lab Results  Component Value Date   WBC 5.5 06/14/2022   HGB 12.8 (L) 06/14/2022   HCT 37.9 (L) 06/14/2022   PLT 172.0 06/14/2022   GLUCOSE 94 09/14/2022   CHOL 128 09/14/2022   TRIG 224.0 (H) 09/14/2022   HDL 44.90 09/14/2022   LDLDIRECT 36.0 09/14/2022   LDLCALC 59 03/01/2012   ALT 30 09/14/2022   AST 39 (H) 09/14/2022   NA 135 09/14/2022   K 4.1 09/14/2022   CL 102 09/14/2022   CREATININE 0.91 09/14/2022   BUN 12 09/14/2022   CO2 26 09/14/2022   TSH 4.44 06/14/2022   PSA 1.49 06/14/2022    CT Chest Wo Contrast  Result Date: 03/20/2020 CLINICAL DATA:  COPD, previous tobacco abuse, pulmonary nodule EXAM: CT CHEST WITHOUT CONTRAST TECHNIQUE: Multidetector CT imaging of the chest was performed following the standard protocol without IV contrast. COMPARISON:  11/30/2017 FINDINGS: Cardiovascular: Unenhanced imaging of the heart and great vessels demonstrates no pericardial effusion. 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 Mild atherosclerosis of the aortic arch. Normal caliber of the thoracic aorta. Mediastinum/Nodes: No enlarged mediastinal or axillary lymph nodes. Thyroid gland, trachea, and esophagus demonstrate no significant findings. Lungs/Pleura: Diffuse upper lobe predominant emphysema. No  acute airspace disease, effusion, or pneumothorax. Multiple small noncalcified pulmonary nodules are again identified, unchanged since prior exam. Index nodules are as follows: Right upper lobe, image 31, 6 mm.  Stable. Left lower lobe, image 116, 4 mm.  Stable. A right middle lobe 3 mm nodule seen previously has calcified in the interim, consistent with granuloma. No new nodules or masses. Central airways are patent. Upper Abdomen: No acute abnormality. Musculoskeletal: No acute or destructive bony lesions. Reconstructed images demonstrate no additional findings. IMPRESSION:  1. Stable subcentimeter pulmonary nodules as above, largest measuring 6 mm. Given the greater than 2 year stability, no further imaging workup is required. This recommendation follows the consensus statement: Guidelines for Management of Incidental Pulmonary Nodules Detected on CT Images: From the Fleischner Society 2017; Radiology 2017; 284:228-243. 2. No acute intrathoracic process. 3. Aortic Atherosclerosis (ICD10-I70.0) and Emphysema (ICD10-J43.9). Electronically Signed   By: Sharlet Salina M.D.   On: 03/20/2020 23:42    Assessment & Plan:   Problem List Items Addressed This Visit     Mixed hyperlipidemia    Cont on  Pravastatin, Zetia, Fish oil      Relevant Orders   TSH   Lipid panel   Elevated PSA    Continue to monitor PSA      Relevant Orders   PSA   CAD (coronary artery disease)    Cont on  Pravastatin, Zetia, Fish oil No CP      Relevant Orders   TSH   Urinalysis   CBC with Differential/Platelet   Lipid panel   PSA   Comprehensive metabolic panel   Iron, TIBC and Ferritin Panel   BPH (benign prostatic hyperplasia)    Better On Proscar PSA q 12 months       Anemia - Primary    Blood donor Check iron, CBC      Relevant Orders   CBC with Differential/Platelet   Iron, TIBC and Ferritin Panel      No orders of the defined types were placed in this encounter.     Follow-up: Return in  about 2 months (around 06/04/2023) for Wellness Exam.  Sonda Primes, MD

## 2023-04-04 NOTE — Assessment & Plan Note (Signed)
Blood donor Check iron, CBC

## 2023-04-05 ENCOUNTER — Encounter: Payer: Self-pay | Admitting: Internal Medicine

## 2023-05-03 ENCOUNTER — Other Ambulatory Visit: Payer: Self-pay | Admitting: Internal Medicine

## 2023-05-12 ENCOUNTER — Encounter: Payer: Self-pay | Admitting: Internal Medicine

## 2023-05-19 ENCOUNTER — Other Ambulatory Visit: Payer: Self-pay | Admitting: Internal Medicine

## 2023-06-03 ENCOUNTER — Other Ambulatory Visit: Payer: Self-pay | Admitting: Internal Medicine

## 2023-06-06 ENCOUNTER — Other Ambulatory Visit (INDEPENDENT_AMBULATORY_CARE_PROVIDER_SITE_OTHER): Payer: Medicare HMO

## 2023-06-06 ENCOUNTER — Encounter: Payer: Self-pay | Admitting: Internal Medicine

## 2023-06-06 ENCOUNTER — Ambulatory Visit (INDEPENDENT_AMBULATORY_CARE_PROVIDER_SITE_OTHER): Payer: Medicare HMO | Admitting: Internal Medicine

## 2023-06-06 VITALS — BP 118/66 | HR 63 | Temp 98.6°F | Ht 68.0 in | Wt 183.0 lb

## 2023-06-06 DIAGNOSIS — I2510 Atherosclerotic heart disease of native coronary artery without angina pectoris: Secondary | ICD-10-CM | POA: Diagnosis not present

## 2023-06-06 DIAGNOSIS — I2583 Coronary atherosclerosis due to lipid rich plaque: Secondary | ICD-10-CM | POA: Diagnosis not present

## 2023-06-06 DIAGNOSIS — R972 Elevated prostate specific antigen [PSA]: Secondary | ICD-10-CM

## 2023-06-06 DIAGNOSIS — D508 Other iron deficiency anemias: Secondary | ICD-10-CM

## 2023-06-06 DIAGNOSIS — E782 Mixed hyperlipidemia: Secondary | ICD-10-CM

## 2023-06-06 DIAGNOSIS — R3912 Poor urinary stream: Secondary | ICD-10-CM | POA: Diagnosis not present

## 2023-06-06 DIAGNOSIS — N401 Enlarged prostate with lower urinary tract symptoms: Secondary | ICD-10-CM | POA: Diagnosis not present

## 2023-06-06 DIAGNOSIS — F411 Generalized anxiety disorder: Secondary | ICD-10-CM

## 2023-06-06 LAB — CBC WITH DIFFERENTIAL/PLATELET
Basophils Absolute: 0 10*3/uL (ref 0.0–0.1)
Basophils Relative: 1.1 % (ref 0.0–3.0)
Eosinophils Absolute: 0.1 10*3/uL (ref 0.0–0.7)
Eosinophils Relative: 1.1 % (ref 0.0–5.0)
HCT: 38.2 % — ABNORMAL LOW (ref 39.0–52.0)
Hemoglobin: 12.7 g/dL — ABNORMAL LOW (ref 13.0–17.0)
Lymphocytes Relative: 28.1 % (ref 12.0–46.0)
Lymphs Abs: 1.3 10*3/uL (ref 0.7–4.0)
MCHC: 33.3 g/dL (ref 30.0–36.0)
MCV: 80.9 fL (ref 78.0–100.0)
Monocytes Absolute: 0.4 10*3/uL (ref 0.1–1.0)
Monocytes Relative: 9.4 % (ref 3.0–12.0)
Neutro Abs: 2.8 10*3/uL (ref 1.4–7.7)
Neutrophils Relative %: 60.3 % (ref 43.0–77.0)
Platelets: 197 10*3/uL (ref 150.0–400.0)
RBC: 4.72 Mil/uL (ref 4.22–5.81)
RDW: 18.1 % — ABNORMAL HIGH (ref 11.5–15.5)
WBC: 4.6 10*3/uL (ref 4.0–10.5)

## 2023-06-06 LAB — PSA: PSA: 1.43 ng/mL (ref 0.10–4.00)

## 2023-06-06 LAB — COMPREHENSIVE METABOLIC PANEL
ALT: 38 U/L (ref 0–53)
AST: 37 U/L (ref 0–37)
Albumin: 4.1 g/dL (ref 3.5–5.2)
Alkaline Phosphatase: 44 U/L (ref 39–117)
BUN: 9 mg/dL (ref 6–23)
CO2: 25 meq/L (ref 19–32)
Calcium: 8.9 mg/dL (ref 8.4–10.5)
Chloride: 100 meq/L (ref 96–112)
Creatinine, Ser: 0.89 mg/dL (ref 0.40–1.50)
GFR: 85.74 mL/min (ref >60.00)
Glucose, Bld: 97 mg/dL (ref 70–99)
Potassium: 4.1 meq/L (ref 3.5–5.1)
Sodium: 133 meq/L — ABNORMAL LOW (ref 135–145)
Total Bilirubin: 0.5 mg/dL (ref 0.2–1.2)
Total Protein: 8.1 g/dL (ref 6.0–8.3)

## 2023-06-06 LAB — URINALYSIS
Bilirubin Urine: NEGATIVE
Hgb urine dipstick: NEGATIVE
Ketones, ur: NEGATIVE
Leukocytes,Ua: NEGATIVE
Nitrite: NEGATIVE
Specific Gravity, Urine: 1.01 (ref 1.000–1.030)
Total Protein, Urine: NEGATIVE
Urine Glucose: NEGATIVE
Urobilinogen, UA: 0.2 (ref 0.0–1.0)
pH: 7 (ref 5.0–8.0)

## 2023-06-06 LAB — TSH: TSH: 3.06 u[IU]/mL (ref 0.35–5.50)

## 2023-06-06 LAB — LIPID PANEL
Cholesterol: 145 mg/dL (ref 0–200)
HDL: 41.3 mg/dL (ref >39.00)
LDL Cholesterol: 43 mg/dL (ref 0–99)
NonHDL: 104.01
Total CHOL/HDL Ratio: 4
Triglycerides: 305 mg/dL — ABNORMAL HIGH (ref 0.0–149.0)
VLDL: 61 mg/dL — ABNORMAL HIGH (ref 0.0–40.0)

## 2023-06-06 MED ORDER — TADALAFIL 5 MG PO TABS: 5.0000 mg | ORAL_TABLET | Freq: Every day | ORAL | 11 refills | Status: DC

## 2023-06-06 NOTE — Progress Notes (Signed)
Subjective:  Patient ID: [name], male    DOB: 01-Dec-1950  Age: 72 y.o. MRN: [id number]  CC: Medical Management of Chronic Issues (2 mnth f/u)   HPI [name] presents for anxiety, hypothyroidism, CAD  C/o BPH sx's - not better  Outpatient Medications Prior to Visit  Medication Sig Dispense Refill   aspirin 81 MG tablet Take 81 mg by mouth daily.     cholecalciferol (VITAMIN D) 1000 UNITS tablet Take 2,000 Units by mouth daily.     diazepam (VALIUM) 10 MG tablet TAKE 1/2 TO 1 (ONE-HALF TO ONE) TABLET BY MOUTH TWICE DAILY AS NEEDED FOR ANXIETY 60 tablet 3   ezetimibe (ZETIA) 10 MG tablet Take 1 tablet by mouth once daily 90 tablet 3   fenofibrate (TRICOR) 48 MG tablet Take 1 tablet by mouth once daily 30 tablet 11   finasteride (PROSCAR) 5 MG tablet Take 1 tablet (5 mg total) by mouth daily. 100 tablet 3   levothyroxine (SYNTHROID) 88 MCG tablet Take 1 tablet (88 mcg total) by mouth daily before breakfast. 90 tablet 3   losartan (COZAAR) 100 MG tablet Take 1 tablet (100 mg total) by mouth daily. 90 tablet 3   Omega-3 Fatty Acids (FISH OIL) 1000 MG CAPS Take 2 capsules (2,000 mg total) by mouth daily. 100 capsule 3   pantoprazole (PROTONIX) 40 MG tablet TAKE 1 TABLET BY MOUTH ONCE DAILY AS NEEDED 90 tablet 3   pravastatin (PRAVACHOL) 20 MG tablet Take 1 tablet (20 mg total) by mouth daily. 90 tablet 3   nitroGLYCERIN (NITROSTAT) 0.4 MG SL tablet DISSOLVE ONE TABLET UNDER THE TONGUE EVERY 5 MINUTES AS NEEDED FOR CHEST PAIN.  DO NOT EXCEED A TOTAL OF 3 DOSES IN 15 MINUTES 20 tablet 2   No facility-administered medications prior to visit.    ROS: Review of Systems  Constitutional:  Negative for appetite change, fatigue and unexpected weight change.  HENT:  Negative for congestion, nosebleeds, sneezing, sore throat and trouble swallowing.   Eyes:  Negative for itching and visual disturbance.  Respiratory:  Negative for cough.   Cardiovascular:  Negative for chest pain,  palpitations and leg swelling.  Gastrointestinal:  Negative for abdominal distention, blood in stool, diarrhea and nausea.  Genitourinary:  Positive for decreased urine volume, difficulty urinating, frequency and urgency. Negative for hematuria.  Musculoskeletal:  Negative for back pain, gait problem, joint swelling and neck pain.  Skin:  Negative for rash.  Neurological:  Negative for dizziness, tremors, speech difficulty and weakness.  Psychiatric/Behavioral:  Negative for agitation, dysphoric mood and sleep disturbance. The patient is not nervous/anxious.     Objective:  BP 118/66 (BP Location: Right Arm, Patient Position: Sitting, Cuff Size: Normal)   Pulse 63   Temp 98.6 F (37 C) (Oral)   Ht 5\' 8"  (1.727 m)   Wt 183 lb (83 kg)   SpO2 98%   BMI 27.83 kg/m   BP Readings from Last 3 Encounters:  06/06/23 118/66  04/04/23 126/68  12/30/22 110/60    Wt Readings from Last 3 Encounters:  06/06/23 183 lb (83 kg)  04/04/23 188 lb 9.6 oz (85.5 kg)  12/30/22 184 lb (83.5 kg)    Physical Exam Constitutional:      General: He is not in acute distress.    Appearance: He is well-developed.     Comments: NAD  Eyes:     Conjunctiva/sclera: Conjunctivae normal.     Pupils: Pupils are equal, round,  and reactive to light.  Neck:     Thyroid: No thyromegaly.     Vascular: No JVD.  Cardiovascular:     Rate and Rhythm: Normal rate and regular rhythm.     Heart sounds: Normal heart sounds. No murmur heard.    No friction rub. No gallop.  Pulmonary:     Effort: Pulmonary effort is normal. No respiratory distress.     Breath sounds: Normal breath sounds. No wheezing or rales.  Chest:     Chest wall: No tenderness.  Abdominal:     General: Bowel sounds are normal. There is no distension.     Palpations: Abdomen is soft. There is no mass.     Tenderness: There is no abdominal tenderness. There is no guarding or rebound.  Musculoskeletal:        General: No tenderness. Normal range  of motion.     Cervical back: Normal range of motion.  Lymphadenopathy:     Cervical: No cervical adenopathy.  Skin:    General: Skin is warm and dry.     Findings: No rash.  Neurological:     Mental Status: He is alert and oriented to person, place, and time.     Cranial Nerves: No cranial nerve deficit.     Motor: No abnormal muscle tone.     Coordination: Coordination normal.     Gait: Gait normal.     Deep Tendon Reflexes: Reflexes are normal and symmetric.  Psychiatric:        Behavior: Behavior normal.        Thought Content: Thought content normal.        Judgment: Judgment normal.     Lab Results  Component Value Date   WBC 5.5 06/14/2022   HGB 12.8 (L) 06/14/2022   HCT 37.9 (L) 06/14/2022   PLT 172.0 06/14/2022   GLUCOSE 94 09/14/2022   CHOL 128 09/14/2022   TRIG 224.0 (H) 09/14/2022   HDL 44.90 09/14/2022   LDLDIRECT 36.0 09/14/2022   LDLCALC 59 03/01/2012   ALT 30 09/14/2022   AST 39 (H) 09/14/2022   NA 135 09/14/2022   K 4.1 09/14/2022   CL 102 09/14/2022   CREATININE 0.91 09/14/2022   BUN 12 09/14/2022   CO2 26 09/14/2022   TSH 4.44 06/14/2022   PSA 1.49 06/14/2022    CT Chest Wo Contrast  Result Date: 03/20/2020 CLINICAL DATA:  COPD, previous tobacco abuse, pulmonary nodule EXAM: CT CHEST WITHOUT CONTRAST TECHNIQUE: Multidetector CT imaging of the chest was performed following the standard protocol without IV contrast. COMPARISON:  11/30/2017 FINDINGS: Cardiovascular: Unenhanced imaging of the heart and great vessels demonstrates no pericardial effusion. 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 Mild atherosclerosis of the aortic arch. Normal caliber of the thoracic aorta. Mediastinum/Nodes: No enlarged mediastinal or axillary lymph nodes. Thyroid gland, trachea, and esophagus demonstrate no significant findings. Lungs/Pleura: Diffuse upper lobe predominant emphysema. No acute airspace disease, effusion, or pneumothorax. Multiple  small noncalcified pulmonary nodules are again identified, unchanged since prior exam. Index nodules are as follows: Right upper lobe, image 31, 6 mm.  Stable. Left lower lobe, image 116, 4 mm.  Stable. A right middle lobe 3 mm nodule seen previously has calcified in the interim, consistent with granuloma. No new nodules or masses. Central airways are patent. Upper Abdomen: No acute abnormality. Musculoskeletal: No acute or destructive bony lesions. Reconstructed images demonstrate no additional findings. IMPRESSION: 1. Stable subcentimeter pulmonary nodules as above, largest measuring 6 mm. Given  the greater than 2 year stability, no further imaging workup is required. This recommendation follows the consensus statement: Guidelines for Management of Incidental Pulmonary Nodules Detected on CT Images: From the Fleischner Society 2017; Radiology 2017; 284:228-243. 2. No acute intrathoracic process. 3. Aortic Atherosclerosis (ICD10-I70.0) and Emphysema (ICD10-J43.9). Electronically Signed   By: Sharlet Salina M.D.   On: 03/20/2020 23:42    Assessment & Plan:   Problem List Items Addressed This Visit     Anxiety disorder - Primary    Continue on Diazepam prn  Potential benefits of a long term benzodiazepines  use as well as potential risks  and complications were explained to the patient and were aknowledged. Not using Rx w/alcohol      Elevated PSA    Check PSA      CAD (coronary artery disease)    Cont on  Pravastatin, Zetia, Fish oil No CP Not using NTG      Relevant Medications   tadalafil (CIALIS) 5 MG tablet   BPH (benign prostatic hyperplasia)    Not better Added Cialis qd No CP Not using NTG         Meds ordered this encounter  Medications   tadalafil (CIALIS) 5 MG tablet    Sig: Take 1 tablet (5 mg total) by mouth daily.    Dispense:  30 tablet    Refill:  11      Follow-up: Return in about 3 months (around 09/04/2023) for a follow-up visit.  Sonda Primes, MD

## 2023-06-06 NOTE — Assessment & Plan Note (Signed)
Not better Added Cialis qd No CP Not using NTG

## 2023-06-06 NOTE — Assessment & Plan Note (Signed)
Cont on  Pravastatin, Zetia, Fish oil No CP Not using NTG

## 2023-06-06 NOTE — Assessment & Plan Note (Signed)
Check PSA. ?

## 2023-06-06 NOTE — Assessment & Plan Note (Signed)
Continue on Diazepam prn  Potential benefits of a long term benzodiazepines  use as well as potential risks  and complications were explained to the patient and were aknowledged. Not using Rx w/alcohol

## 2023-06-07 LAB — IRON,TIBC AND FERRITIN PANEL
%SAT: 40 % (ref 20–48)
Ferritin: 107 ng/mL (ref 24–380)
Iron: 160 ug/dL (ref 50–180)
TIBC: 398 ug/dL (ref 250–425)

## 2023-06-14 ENCOUNTER — Encounter: Payer: Self-pay | Admitting: Internal Medicine

## 2023-07-21 ENCOUNTER — Ambulatory Visit: Payer: Medicare HMO

## 2023-07-21 VITALS — Ht 68.5 in | Wt 180.0 lb

## 2023-07-21 DIAGNOSIS — Z0000 Encounter for general adult medical examination without abnormal findings: Secondary | ICD-10-CM

## 2023-07-21 NOTE — Patient Instructions (Signed)
Mr. Merker , Thank you for taking time to come for your Medicare Wellness Visit. I appreciate your ongoing commitment to your health goals. Please review the following plan we discussed and let me know if I can assist you in the future.   Referrals/Orders/Follow-Ups/Clinician Recommendations: It was nice to meet you today.  Keep up the good work.  This is a list of the screening recommended for you and due dates:  Health Maintenance  Topic Date Due   COVID-19 Vaccine (7 - 2024-25 season) 03/06/2023   Medicare Annual Wellness Visit  07/20/2024   Colon Cancer Screening  04/04/2025   DTaP/Tdap/Td vaccine (3 - Td or Tdap) 09/17/2031   Pneumonia Vaccine  Completed   Flu Shot  Completed   Hepatitis C Screening  Completed   Zoster (Shingles) Vaccine  Completed   HPV Vaccine  Aged Out    Advanced directives: (Declined) Advance directive discussed with you today. Even though you declined this today, please call our office should you change your mind, and we can give you the proper paperwork for you to fill out.  Next Medicare Annual Wellness Visit scheduled for next year: Yes

## 2023-07-21 NOTE — Progress Notes (Addendum)
Subjective:   [name] is a 73 y.o. male who presents for Medicare Annual/Subsequent preventive examination.  Visit Complete: Virtual I connected with  [name] on 07/21/23 by a video and audio enabled telemedicine application and verified that I am speaking with the correct person using two identifiers.  Patient Location: Home  Provider Location: Office/Clinic  I discussed the limitations of evaluation and management by telemedicine. The patient expressed understanding and agreed to proceed.  Vital Signs: Because this visit was a virtual/telehealth visit, some criteria may be missing or patient reported. Any vitals not documented were not able to be obtained and vitals that have been documented are patient reported.  Patient Medicare AWV questionnaire was completed by the patient on 07/17/23; I have confirmed that all information answered by patient is correct and no changes since this date.  Cardiac Risk Factors include: male gender;advanced age (>28men, >41 women);Other (see comment), Risk factor comments: MI, CAD, COPD, Atherosclerosis of aorta, Hypertensive heart disease     Objective:    Today's Vitals   07/21/23 1013  Weight: 180 lb (81.6 kg)  Height: 5' 8.5" (1.74 m)   Body mass index is 26.97 kg/m.     07/21/2023   10:24 AM 07/29/2022    4:06 PM 07/20/2021    1:06 PM 03/03/2020    1:19 PM 11/30/2017    4:07 PM 11/30/2017   10:23 AM 11/08/2017    9:41 AM  Advanced Directives  Does Patient Have a Medical Advance Directive? No No No No No No No  Would patient like information on creating a medical advance directive?  No - Patient declined No - Patient declined No - Patient declined No - Patient declined Yes (ED - Information included in AVS)     Current Medications (verified) Outpatient Encounter Medications as of 07/21/2023  Medication Sig   aspirin 81 MG tablet Take 81 mg by mouth daily.   cholecalciferol (VITAMIN D) 1000 UNITS tablet Take 2,000 Units  by mouth daily.   diazepam (VALIUM) 10 MG tablet TAKE 1/2 TO 1 (ONE-HALF TO ONE) TABLET BY MOUTH TWICE DAILY AS NEEDED FOR ANXIETY   ezetimibe (ZETIA) 10 MG tablet Take 1 tablet by mouth once daily   fenofibrate (TRICOR) 48 MG tablet Take 1 tablet by mouth once daily   finasteride (PROSCAR) 5 MG tablet Take 1 tablet (5 mg total) by mouth daily.   levothyroxine (SYNTHROID) 88 MCG tablet Take 1 tablet (88 mcg total) by mouth daily before breakfast.   losartan (COZAAR) 100 MG tablet Take 1 tablet (100 mg total) by mouth daily.   Omega-3 Fatty Acids (FISH OIL) 1000 MG CAPS Take 2 capsules (2,000 mg total) by mouth daily.   pantoprazole (PROTONIX) 40 MG tablet TAKE 1 TABLET BY MOUTH ONCE DAILY AS NEEDED   pravastatin (PRAVACHOL) 20 MG tablet Take 1 tablet (20 mg total) by mouth daily.   tadalafil (CIALIS) 5 MG tablet Take 1 tablet (5 mg total) by mouth daily.   No facility-administered encounter medications on file as of 07/21/2023.    Allergies (verified) Atorvastatin, Bee venom, Crestor [rosuvastatin calcium], and Statins   History: Past Medical History:  Diagnosis Date   Allergy    Allergy to bee sting    Anxiety    CAD (coronary artery disease)    statin intolerant   Cyst    Depression    Dermatitis due to sun    ED (erectile dysfunction)    Hyperlipidemia  Hypertension    MI (myocardial infarction) (HCC) 2002   OA (osteoarthritis)    Tobacco abuse    Past Surgical History:  Procedure Laterality Date   broken jaw     1970-80's    cardiac catherization  07-14-01   COLONOSCOPY  2013   ESOPHAGOGASTRODUODENOSCOPY  10-06-01   stress cardiolite  06-30-01   UPPER GASTROINTESTINAL ENDOSCOPY     Family History  Problem Relation Age of Onset   Cancer Brother        lymphoma   Heart disease Father    Colon cancer Neg Hx    Esophageal cancer Neg Hx    Rectal cancer Neg Hx    Stomach cancer Neg Hx    Colon polyps Neg Hx    Social History   Socioeconomic History   Marital  status: Single    Spouse name: Cynitha   Number of children: Not on file   Years of education: 9 years   Highest education level: 10th grade  Occupational History   Occupation: Retired    Comment: 12/2012  Tobacco Use   Smoking status: Former    Current packs/day: 0.00    Types: Cigarettes    Quit date: 11/02/2012    Years since quitting: 10.7   Smokeless tobacco: Never  Vaping Use   Vaping status: Never Used  Substance and Sexual Activity   Alcohol use: Not Currently    Alcohol/week: 12.0 standard drinks of alcohol    Types: 12 Cans of beer per week    Comment: Quit 07/06/2021   Drug use: Yes    Types: Marijuana    Comment: marijuana   Sexual activity: Yes  Other Topics Concern   Not on file  Social History Narrative   Regular exercise-yes (5 days, 30 minutes)   Lives with girlfriend.   Quit drinking 07/06/2021      Social Drivers of Health   Financial Resource Strain: Low Risk  (07/21/2023)   Overall Financial Resource Strain (CARDIA)    Difficulty of Paying Living Expenses: Not hard at all  Food Insecurity: No Food Insecurity (07/21/2023)   Hunger Vital Sign    Worried About Running Out of Food in the Last Year: Never true    Ran Out of Food in the Last Year: Never true  Transportation Needs: No Transportation Needs (07/21/2023)   PRAPARE - Administrator, Civil Service (Medical): No    Lack of Transportation (Non-Medical): No  Physical Activity: Sufficiently Active (07/21/2023)   Exercise Vital Sign    Days of Exercise per Week: 5 days    Minutes of Exercise per Session: 30 min  Stress: No Stress Concern Present (07/21/2023)   [employer] of Occupational Health - Occupational Stress Questionnaire    Feeling of Stress : Not at all  Recent Concern: Stress - Stress Concern Present (06/03/2023)   [employer] of Occupational Health - Occupational Stress Questionnaire    Feeling of Stress : To some extent  Social Connections: Moderately Isolated  (07/21/2023)   Social Connection and Isolation Panel [NHANES]    Frequency of Communication with Friends and Family: Three times a week    Frequency of Social Gatherings with Friends and Family: Twice a week    Attends Religious Services: Never    Database administrator or Organizations: No    Attends Banker Meetings: Never    Marital Status: Living with partner    Tobacco Counseling Counseling given: Not Answered   Clinical  Intake:  Pre-visit preparation completed: Yes  Pain : No/denies pain     BMI - recorded: 26.97 Nutritional Status: BMI 25 -29 Overweight Nutritional Risks: None  How often do you need to have someone help you when you read instructions, pamphlets, or other written materials from your doctor or pharmacy?: 1 - Never  Interpreter Needed?: No  Information entered by :: Mikaya Bunner, RMA   Activities of Daily Living    07/17/2023    8:44 AM 07/29/2022    4:10 PM  In your present state of health, do you have any difficulty performing the following activities:  Hearing? 1 1  Vision? 0 0  Difficulty concentrating or making decisions? 0 0  Walking or climbing stairs? 0 0  Dressing or bathing? 0 0  Doing errands, shopping? 0 0  Preparing Food and eating ? N N  Using the Toilet? N N  In the past six months, have you accidently leaked urine? N N  Do you have problems with loss of bowel control? N N  Managing your Medications? N N  Managing your Finances? N N  Housekeeping or managing your Housekeeping? N N    Patient Care Team: Plotnikov, Georgina Quint, MD as PCP - General Christia Reading, MD as Consulting Physician (Otolaryngology) [facility]  Indicate any recent Medical Services you may have received from other than [facility] providers in the past year (date may be approximate).     Assessment:   This is a routine wellness examination for [name].  Hearing/Vision screen Hearing Screening - Comments:: Wears hearing aides Vision  Screening - Comments:: Wears eyeglasses   Goals Addressed             This Visit's Progress    My goal for 2024 is to stay healthy by maintaining my health, staying independent and physically active.   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  Still doing great-2025      Depression Screen    07/21/2023   10:28 AM 06/06/2023    9:35 AM 04/04/2023    9:02 AM 12/30/2022    8:43 AM 09/14/2022    9:44 AM 07/29/2022    4:09 PM 06/14/2022    9:15 AM  PHQ 2/9 Scores  PHQ - 2 Score 1 0 0 0 0 0 0  PHQ- 9 Score 2  0  0 0 0    Fall Risk    07/17/2023    8:44 AM 06/06/2023    9:34 AM 04/04/2023    9:02 AM 12/30/2022    8:42 AM 09/14/2022    9:44 AM  Fall Risk   Falls in the past year? 0 0 0 0 0  Number falls in past yr: 0 0 0 0 0  Injury with Fall? 0 0 0 0 0  Risk for fall due to :  No Fall Risks No Fall Risks No Fall Risks No Fall Risks  Follow up Falls evaluation completed;Falls prevention discussed Falls evaluation completed Falls evaluation completed Falls evaluation completed Falls evaluation completed    MEDICARE RISK AT HOME: Medicare Risk at Home Any stairs in or around the home?: (Patient-Rptd) Yes If so, are there any without handrails?: (Patient-Rptd) Yes Home free of loose throw rugs in walkways, pet beds, electrical cords, etc?: (Patient-Rptd) Yes Adequate lighting in your home to reduce risk of falls?: (Patient-Rptd) Yes Life alert?: (Patient-Rptd) No Use of a cane, walker or w/c?: (Patient-Rptd) No Grab bars  in the bathroom?: (Patient-Rptd) Yes Shower chair or bench in shower?: (Patient-Rptd) No Elevated toilet seat or a handicapped toilet?: (Patient-Rptd) No  TIMED UP AND GO:  Was the test performed?  No    Cognitive Function:        07/21/2023   10:14 AM 07/29/2022    4:15 PM  6CIT Screen  What Year? 0 points 0 points  What month? 0 points 0 points  What time? 0 points 0 points  Count back from 20 0 points 0 points   Months in reverse 0 points 0 points  Repeat phrase 0 points 0 points  Total Score 0 points 0 points    Immunizations Immunization History  Administered Date(s) Administered   Fluad Quad(high Dose 65+) 02/22/2019, 03/18/2021, 03/16/2022   Influenza Split 05/04/2023   Influenza Whole 07/07/2009, 03/03/2010   Influenza, Quadrivalent, Recombinant, Inj, Pf 02/21/2018   Influenza,inj,Quad PF,6+ Mos 03/18/2015   Influenza-Unspecified 03/05/2016, 04/22/2017, 03/28/2020   PFIZER(Purple Top)SARS-COV-2 Vaccination 08/11/2019, 09/01/2019, 04/17/2020, 10/20/2020, 04/13/2021   Pfizer(Comirnaty)Fall Seasonal Vaccine 12 years and older 04/19/2022   Pneumococcal Conjugate-13 08/08/2013, 09/05/2016   Pneumococcal Polysaccharide-23 03/08/2012, 08/10/2017   Pneumococcal-Unspecified 05/04/2023   Respiratory Syncytial Virus Vaccine,Recomb Aduvanted(Arexvy) 03/16/2022   Td 06/01/2010   Tdap 09/16/2021   Zoster Recombinant(Shingrix) 08/10/2017, 10/21/2017    TDAP status: Up to date  Flu Vaccine status: Up to date  Pneumococcal vaccine status: Up to date  Covid-19 vaccine status: Declined, Education has been provided regarding the importance of this vaccine but patient still declined. Advised may receive this vaccine at local pharmacy or Health Dept.or vaccine clinic. Aware to provide a copy of the vaccination record if obtained from local pharmacy or Health Dept. Verbalized acceptance and understanding.  Qualifies for Shingles Vaccine? Yes   Zostavax completed Yes   Shingrix Completed?: Yes  Screening Tests Health Maintenance  Topic Date Due   COVID-19 Vaccine (7 - 2024-25 season) 08/06/2023 (Originally 03/06/2023)   Medicare Annual Wellness (AWV)  07/20/2024   Colonoscopy  04/04/2025   DTaP/Tdap/Td (3 - Td or Tdap) 09/17/2031   Pneumonia Vaccine 36+ Years old  Completed   INFLUENZA VACCINE  Completed   Hepatitis C Screening  Completed   Zoster Vaccines- Shingrix  Completed   HPV VACCINES   Aged Out    Health Maintenance  There are no preventive care reminders to display for this patient.   Colorectal cancer screening: Type of screening: Colonoscopy. Completed 04/04/2018. Repeat every 7 years  Lung Cancer Screening: (Low Dose CT Chest recommended if Age 3-80 years, 20 pack-year currently smoking OR have quit w/in 15years.) does not qualify.   Lung Cancer Screening Referral: N/A  Additional Screening:  Hepatitis C Screening: does qualify; Completed 08/08/2013  Vision Screening: Recommended annual ophthalmology exams for early detection of glaucoma and other disorders of the eye. Is the patient up to date with their annual eye exam?  Yes  Who is the provider or what is the name of the office in which the patient attends annual eye exams? [facility] ([place]) If pt is not established with a provider, would they like to be referred to a provider to establish care? No .   Dental Screening: Recommended annual dental exams for proper oral hygiene   Community Resource Referral / Chronic Care Management: CRR required this visit?  No   CCM required this visit?  No     Plan:     I have personally reviewed and noted the following in the patient's  chart:   Medical and social history Use of alcohol, tobacco or illicit drugs  Current medications and supplements including opioid prescriptions. Patient is not currently taking opioid prescriptions. Functional ability and status Nutritional status Physical activity Advanced directives List of other physicians Hospitalizations, surgeries, and ER visits in previous 12 months Vitals Screenings to include cognitive, depression, and falls Referrals and appointments  In addition, I have reviewed and discussed with patient certain preventive protocols, quality metrics, and best practice recommendations. A written personalized care plan for preventive services as well as general preventive health recommendations were provided  to patient.     [name], CMA   07/21/2023   After Visit Summary: (MyChart) Due to this being a telephonic visit, the after visit summary with patients personalized plan was offered to patient via MyChart   Nurse Notes: Patient is up to date on all health maintenance with no concerns to address today.  Medical screening examination/treatment/procedure(s) were performed by non-physician practitioner and as supervising physician I was immediately available for consultation/collaboration.  I agree with above. Jacinta Shoe, MD

## 2023-08-16 ENCOUNTER — Other Ambulatory Visit: Payer: Self-pay | Admitting: Internal Medicine

## 2023-08-24 DIAGNOSIS — H7292 Unspecified perforation of tympanic membrane, left ear: Secondary | ICD-10-CM | POA: Diagnosis not present

## 2023-08-24 DIAGNOSIS — H9193 Unspecified hearing loss, bilateral: Secondary | ICD-10-CM | POA: Insufficient documentation

## 2023-08-24 DIAGNOSIS — H90A21 Sensorineural hearing loss, unilateral, right ear, with restricted hearing on the contralateral side: Secondary | ICD-10-CM | POA: Diagnosis not present

## 2023-08-24 DIAGNOSIS — H90A32 Mixed conductive and sensorineural hearing loss, unilateral, left ear with restricted hearing on the contralateral side: Secondary | ICD-10-CM | POA: Diagnosis not present

## 2023-09-05 ENCOUNTER — Ambulatory Visit: Payer: Medicare HMO | Admitting: Internal Medicine

## 2023-09-06 ENCOUNTER — Other Ambulatory Visit: Payer: Self-pay | Admitting: Internal Medicine

## 2023-09-09 ENCOUNTER — Ambulatory Visit: Payer: Medicare HMO | Admitting: Internal Medicine

## 2023-09-09 ENCOUNTER — Encounter: Payer: Self-pay | Admitting: Internal Medicine

## 2023-09-09 VITALS — BP 102/60 | HR 58 | Temp 98.2°F | Ht 68.5 in | Wt 184.0 lb

## 2023-09-09 DIAGNOSIS — H7292 Unspecified perforation of tympanic membrane, left ear: Secondary | ICD-10-CM

## 2023-09-09 DIAGNOSIS — E559 Vitamin D deficiency, unspecified: Secondary | ICD-10-CM | POA: Diagnosis not present

## 2023-09-09 DIAGNOSIS — I2583 Coronary atherosclerosis due to lipid rich plaque: Secondary | ICD-10-CM

## 2023-09-09 DIAGNOSIS — J449 Chronic obstructive pulmonary disease, unspecified: Secondary | ICD-10-CM

## 2023-09-09 DIAGNOSIS — F411 Generalized anxiety disorder: Secondary | ICD-10-CM

## 2023-09-09 DIAGNOSIS — I2510 Atherosclerotic heart disease of native coronary artery without angina pectoris: Secondary | ICD-10-CM

## 2023-09-09 DIAGNOSIS — E782 Mixed hyperlipidemia: Secondary | ICD-10-CM | POA: Diagnosis not present

## 2023-09-09 MED ORDER — PANTOPRAZOLE SODIUM 40 MG PO TBEC: 40.0000 mg | DELAYED_RELEASE_TABLET | Freq: Every day | ORAL | 3 refills | Status: AC | PRN

## 2023-09-09 MED ORDER — LOSARTAN POTASSIUM 100 MG PO TABS: 100.0000 mg | ORAL_TABLET | Freq: Every day | ORAL | 3 refills | Status: AC

## 2023-09-09 MED ORDER — FENOFIBRATE 48 MG PO TABS: 48.0000 mg | ORAL_TABLET | Freq: Every day | ORAL | 3 refills | Status: AC

## 2023-09-09 MED ORDER — PRAVASTATIN SODIUM 20 MG PO TABS: 20.0000 mg | ORAL_TABLET | Freq: Every day | ORAL | 3 refills | Status: AC

## 2023-09-09 MED ORDER — FINASTERIDE 5 MG PO TABS: 5.0000 mg | ORAL_TABLET | Freq: Every day | ORAL | 3 refills | Status: DC

## 2023-09-09 MED ORDER — LEVOTHYROXINE SODIUM 88 MCG PO TABS: 88.0000 ug | ORAL_TABLET | Freq: Every day | ORAL | 3 refills | Status: AC

## 2023-09-09 MED ORDER — EZETIMIBE 10 MG PO TABS: 10.0000 mg | ORAL_TABLET | Freq: Every day | ORAL | 3 refills | Status: DC

## 2023-09-09 NOTE — Assessment & Plan Note (Signed)
 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 The larger the hole, the more likely it is to affect hearing. The patient was informed that the likelihood of improving hearing through surgical repair depends on whether the hearing loss is due to structural issues or nerve-related causes. A comprehensive audiological evaluation will be conducted to assess the extent of hearing loss and determine the potential benefits of surgical intervention. He will be contacted with the results of the hearing test to discuss the findings and the next steps.

## 2023-09-09 NOTE — Assessment & Plan Note (Signed)
 Cont on  Pravastatin, Zetia, Fish oil No CP Not using NTG

## 2023-09-09 NOTE — Assessment & Plan Note (Signed)
 Cont on  Pravastatin, Zetia, Fish oil, tricor

## 2023-09-09 NOTE — Assessment & Plan Note (Signed)
Continue on Diazepam prn  Potential benefits of a long term benzodiazepines  use as well as potential risks  and complications were explained to the patient and were aknowledged. Not using Rx w/alcohol

## 2023-09-09 NOTE — Assessment & Plan Note (Signed)
 On Vit D

## 2023-09-09 NOTE — Assessment & Plan Note (Signed)
 Doing well

## 2023-09-09 NOTE — Progress Notes (Signed)
 Subjective:  Patient ID: [name], male    DOB: 08/23/1950  Age: 73 y.o. MRN: [id number]  CC: Medical Management of Chronic Issues (3 mnth f/u)   HPI [name] presents for L TM perforation, anxiety, dyslipidemia  Outpatient Medications Prior to Visit  Medication Sig Dispense Refill   aspirin 81 MG tablet Take 81 mg by mouth daily.     cholecalciferol (VITAMIN D) 1000 UNITS tablet Take 2,000 Units by mouth daily.     diazepam (VALIUM) 10 MG tablet TAKE 1/2 TO 1 (ONE-HALF TO ONE) TABLET BY MOUTH TWICE DAILY AS NEEDED FOR ANXIETY 60 tablet 2   Omega-3 Fatty Acids (FISH OIL) 1000 MG CAPS Take 2 capsules (2,000 mg total) by mouth daily. 100 capsule 3   tadalafil (CIALIS) 5 MG tablet Take 1 tablet (5 mg total) by mouth daily. 30 tablet 11   ezetimibe (ZETIA) 10 MG tablet Take 1 tablet by mouth once daily 90 tablet 3   fenofibrate (TRICOR) 48 MG tablet Take 1 tablet by mouth once daily 30 tablet 11   finasteride (PROSCAR) 5 MG tablet Take 1 tablet (5 mg total) by mouth daily. 100 tablet 3   levothyroxine (SYNTHROID) 88 MCG tablet TAKE 1 TABLET BY MOUTH ONCE DAILY BEFORE BREAKFAST 90 tablet 0   losartan (COZAAR) 100 MG tablet Take 1 tablet by mouth once daily 90 tablet 0   pantoprazole (PROTONIX) 40 MG tablet TAKE 1 TABLET BY MOUTH ONCE DAILY AS NEEDED 90 tablet 3   pravastatin (PRAVACHOL) 20 MG tablet Take 1 tablet by mouth once daily 90 tablet 0   No facility-administered medications prior to visit.    ROS: Review of Systems  Constitutional:  Negative for appetite change, fatigue and unexpected weight change.  HENT:  Positive for hearing loss. Negative for congestion, ear pain, nosebleeds, sneezing, sore throat and trouble swallowing.   Eyes:  Negative for itching and visual disturbance.  Respiratory:  Negative for cough and shortness of breath.   Cardiovascular:  Negative for chest pain, palpitations and leg swelling.  Gastrointestinal:  Negative for abdominal  distention, blood in stool, diarrhea and nausea.  Genitourinary:  Negative for frequency and hematuria.  Musculoskeletal:  Negative for back pain, gait problem, joint swelling and neck pain.  Skin:  Negative for rash.  Neurological:  Negative for dizziness, tremors, speech difficulty and weakness.  Psychiatric/Behavioral:  Negative for agitation, dysphoric mood and sleep disturbance. The patient is nervous/anxious.     Objective:  BP 102/60   Pulse (!) 58   Temp 98.2 F (36.8 C) (Oral)   Ht 5' 8.5" (1.74 m)   Wt 184 lb (83.5 kg)   SpO2 94%   BMI 27.57 kg/m   BP Readings from Last 3 Encounters:  09/09/23 102/60  06/06/23 118/66  04/04/23 126/68    Wt Readings from Last 3 Encounters:  09/09/23 184 lb (83.5 kg)  07/21/23 180 lb (81.6 kg)  06/06/23 183 lb (83 kg)    Physical Exam Constitutional:      General: He is not in acute distress.    Appearance: Normal appearance. He is well-developed.     Comments: NAD  Eyes:     Conjunctiva/sclera: Conjunctivae normal.     Pupils: Pupils are equal, round, and reactive to light.  Neck:     Thyroid: No thyromegaly.     Vascular: No JVD.  Cardiovascular:     Rate and Rhythm: Normal rate and regular rhythm.  Heart sounds: Normal heart sounds. No murmur heard.    No friction rub. No gallop.  Pulmonary:     Effort: Pulmonary effort is normal. No respiratory distress.     Breath sounds: Normal breath sounds. No wheezing or rales.  Chest:     Chest wall: No tenderness.  Abdominal:     General: Bowel sounds are normal. There is no distension.     Palpations: Abdomen is soft. There is no mass.     Tenderness: There is no abdominal tenderness. There is no guarding or rebound.  Musculoskeletal:        General: No tenderness. Normal range of motion.     Cervical back: Normal range of motion.     Right lower leg: No edema.     Left lower leg: No edema.  Lymphadenopathy:     Cervical: No cervical adenopathy.  Skin:    General:  Skin is warm and dry.     Findings: No rash.  Neurological:     Mental Status: He is alert and oriented to person, place, and time.     Cranial Nerves: No cranial nerve deficit.     Motor: No abnormal muscle tone.     Coordination: Coordination normal.     Gait: Gait normal.     Deep Tendon Reflexes: Reflexes are normal and symmetric.  Psychiatric:        Behavior: Behavior normal.        Thought Content: Thought content normal.        Judgment: Judgment normal.   L TM w/perf  Lab Results  Component Value Date   WBC 4.6 06/06/2023   HGB 12.7 (L) 06/06/2023   HCT 38.2 (L) 06/06/2023   PLT 197.0 06/06/2023   GLUCOSE 97 06/06/2023   CHOL 145 06/06/2023   TRIG 305.0 (H) 06/06/2023   HDL 41.30 06/06/2023   LDLDIRECT 36.0 09/14/2022   LDLCALC 43 06/06/2023   ALT 38 06/06/2023   AST 37 06/06/2023   NA 133 (L) 06/06/2023   K 4.1 06/06/2023   CL 100 06/06/2023   CREATININE 0.89 06/06/2023   BUN 9 06/06/2023   CO2 25 06/06/2023   TSH 3.06 06/06/2023   PSA 1.43 06/06/2023    CT Chest Wo Contrast Result Date: 03/20/2020 CLINICAL DATA:  COPD, previous tobacco abuse, pulmonary nodule EXAM: CT CHEST WITHOUT CONTRAST TECHNIQUE: Multidetector CT imaging of the chest was performed following the standard protocol without IV contrast. COMPARISON:  11/30/2017 FINDINGS: Cardiovascular: Unenhanced imaging of the heart and great vessels demonstrates no pericardial effusion. 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 Mild atherosclerosis of the aortic arch. Normal caliber of the thoracic aorta. Mediastinum/Nodes: No enlarged mediastinal or axillary lymph nodes. Thyroid gland, trachea, and esophagus demonstrate no significant findings. Lungs/Pleura: Diffuse upper lobe predominant emphysema. No acute airspace disease, effusion, or pneumothorax. Multiple small noncalcified pulmonary nodules are again identified, unchanged since prior exam. Index nodules are as follows: Right upper  lobe, image 31, 6 mm.  Stable. Left lower lobe, image 116, 4 mm.  Stable. A right middle lobe 3 mm nodule seen previously has calcified in the interim, consistent with granuloma. No new nodules or masses. Central airways are patent. Upper Abdomen: No acute abnormality. Musculoskeletal: No acute or destructive bony lesions. Reconstructed images demonstrate no additional findings. IMPRESSION: 1. Stable subcentimeter pulmonary nodules as above, largest measuring 6 mm. Given the greater than 2 year stability, no further imaging workup is required. This recommendation follows the consensus statement: Guidelines  for Management of Incidental Pulmonary Nodules Detected on CT Images: From the Fleischner Society 2017; Radiology 2017; 284:228-243. 2. No acute intrathoracic process. 3. Aortic Atherosclerosis (ICD10-I70.0) and Emphysema (ICD10-J43.9). Electronically Signed   By: Sharlet Salina M.D.   On: 03/20/2020 23:42    Assessment & Plan:   Problem List Items Addressed This Visit     Vitamin D deficiency   On Vit D      Mixed hyperlipidemia   Cont on  Pravastatin, Zetia, Fish oil, tricor      Relevant Medications   ezetimibe (ZETIA) 10 MG tablet   fenofibrate (TRICOR) 48 MG tablet   losartan (COZAAR) 100 MG tablet   pravastatin (PRAVACHOL) 20 MG tablet   Other Relevant Orders   TSH   Comprehensive metabolic panel   Lipid panel   Anxiety disorder   Continue on Diazepam prn  Potential benefits of a long term benzodiazepines  use as well as potential risks  and complications were explained to the patient and were aknowledged. Not using Rx w/alcohol      Relevant Orders   TSH   Comprehensive metabolic panel   Lipid panel   COPD (chronic obstructive pulmonary disease) (HCC) - Primary   Doing well      Relevant Orders   TSH   Comprehensive metabolic panel   Lipid panel   CAD (coronary artery disease)   Cont on  Pravastatin, Zetia, Fish oil No CP Not using NTG      Relevant Medications    ezetimibe (ZETIA) 10 MG tablet   fenofibrate (TRICOR) 48 MG tablet   losartan (COZAAR) 100 MG tablet   pravastatin (PRAVACHOL) 20 MG tablet   Tympanic membrane perforation, left   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 The larger the hole, the more likely it is to affect hearing. The patient was informed that the likelihood of improving hearing through surgical repair depends on whether the hearing loss is due to structural issues or nerve-related causes. A comprehensive audiological evaluation will be conducted to assess the extent of hearing loss and determine the potential benefits of surgical intervention. He will be contacted with the results of the hearing test to discuss the findings and the next steps.          Meds ordered this encounter  Medications   ezetimibe (ZETIA) 10 MG tablet    Sig: Take 1 tablet (10 mg total) by mouth daily.    Dispense:  90 tablet    Refill:  3   fenofibrate (TRICOR) 48 MG tablet    Sig: Take 1 tablet (48 mg total) by mouth daily.    Dispense:  90 tablet    Refill:  3   finasteride (PROSCAR) 5 MG tablet    Sig: Take 1 tablet (5 mg total) by mouth daily.    Dispense:  100 tablet    Refill:  3   levothyroxine (SYNTHROID) 88 MCG tablet    Sig: Take 1 tablet (88 mcg total) by mouth daily before breakfast.    Dispense:  90 tablet    Refill:  3   losartan (COZAAR) 100 MG tablet    Sig: Take 1 tablet (100 mg total) by mouth daily.    Dispense:  90 tablet    Refill:  3   pantoprazole (PROTONIX) 40 MG tablet    Sig: Take 1 tablet (40 mg  total) by mouth daily as needed.    Dispense:  90 tablet    Refill:  3   pravastatin (PRAVACHOL) 20 MG tablet    Sig: Take 1 tablet (20 mg total) by mouth daily.    Dispense:  90 tablet    Refill:  3      Follow-up: Return in about 3 months (around 12/10/2023) for a follow-up  visit.  Sonda Primes, MD

## 2023-09-19 DIAGNOSIS — H9012 Conductive hearing loss, unilateral, left ear, with unrestricted hearing on the contralateral side: Secondary | ICD-10-CM | POA: Diagnosis not present

## 2023-09-19 DIAGNOSIS — H7202 Central perforation of tympanic membrane, left ear: Secondary | ICD-10-CM | POA: Diagnosis not present

## 2023-10-03 DIAGNOSIS — H7292 Unspecified perforation of tympanic membrane, left ear: Secondary | ICD-10-CM | POA: Diagnosis not present

## 2023-10-26 DIAGNOSIS — H7292 Unspecified perforation of tympanic membrane, left ear: Secondary | ICD-10-CM | POA: Diagnosis not present

## 2023-12-12 ENCOUNTER — Other Ambulatory Visit: Payer: Self-pay | Admitting: Internal Medicine

## 2023-12-12 ENCOUNTER — Ambulatory Visit (INDEPENDENT_AMBULATORY_CARE_PROVIDER_SITE_OTHER): Admitting: Internal Medicine

## 2023-12-12 ENCOUNTER — Encounter: Payer: Self-pay | Admitting: Internal Medicine

## 2023-12-12 ENCOUNTER — Other Ambulatory Visit (INDEPENDENT_AMBULATORY_CARE_PROVIDER_SITE_OTHER)

## 2023-12-12 VITALS — BP 110/70 | HR 59 | Temp 98.3°F | Ht 68.5 in | Wt 183.0 lb

## 2023-12-12 DIAGNOSIS — E782 Mixed hyperlipidemia: Secondary | ICD-10-CM

## 2023-12-12 DIAGNOSIS — R3912 Poor urinary stream: Secondary | ICD-10-CM

## 2023-12-12 DIAGNOSIS — R079 Chest pain, unspecified: Secondary | ICD-10-CM

## 2023-12-12 DIAGNOSIS — F439 Reaction to severe stress, unspecified: Secondary | ICD-10-CM | POA: Insufficient documentation

## 2023-12-12 DIAGNOSIS — F411 Generalized anxiety disorder: Secondary | ICD-10-CM | POA: Diagnosis not present

## 2023-12-12 DIAGNOSIS — J449 Chronic obstructive pulmonary disease, unspecified: Secondary | ICD-10-CM

## 2023-12-12 DIAGNOSIS — N401 Enlarged prostate with lower urinary tract symptoms: Secondary | ICD-10-CM | POA: Diagnosis not present

## 2023-12-12 LAB — COMPREHENSIVE METABOLIC PANEL WITH GFR
ALT: 19 U/L (ref 0–53)
AST: 24 U/L (ref 0–37)
Albumin: 4.2 g/dL (ref 3.5–5.2)
Alkaline Phosphatase: 38 U/L — ABNORMAL LOW (ref 39–117)
BUN: 12 mg/dL (ref 6–23)
CO2: 25 meq/L (ref 19–32)
Calcium: 9.3 mg/dL (ref 8.4–10.5)
Chloride: 103 meq/L (ref 96–112)
Creatinine, Ser: 0.98 mg/dL (ref 0.40–1.50)
GFR: 76.87 mL/min (ref >60.00)
Glucose, Bld: 96 mg/dL (ref 70–99)
Potassium: 3.9 meq/L (ref 3.5–5.1)
Sodium: 136 meq/L (ref 135–145)
Total Bilirubin: 0.5 mg/dL (ref 0.2–1.2)
Total Protein: 8.2 g/dL (ref 6.0–8.3)

## 2023-12-12 LAB — LIPID PANEL
Cholesterol: 143 mg/dL (ref 0–200)
HDL: 48.7 mg/dL (ref >39.00)
LDL Cholesterol: 39 mg/dL (ref 0–99)
NonHDL: 94.61
Total CHOL/HDL Ratio: 3
Triglycerides: 278 mg/dL — ABNORMAL HIGH (ref 0.0–149.0)
VLDL: 55.6 mg/dL — ABNORMAL HIGH (ref 0.0–40.0)

## 2023-12-12 MED ORDER — DIAZEPAM 10 MG PO TABS: ORAL_TABLET | ORAL | 2 refills | Status: DC

## 2023-12-12 MED ORDER — FINASTERIDE 5 MG PO TABS: 5.0000 mg | ORAL_TABLET | Freq: Every day | ORAL | 3 refills | Status: AC

## 2023-12-12 MED ORDER — EZETIMIBE 10 MG PO TABS: 10.0000 mg | ORAL_TABLET | Freq: Every day | ORAL | 3 refills | Status: AC

## 2023-12-12 MED ORDER — NITROGLYCERIN 0.4 MG SL SUBL
0.4000 mg | SUBLINGUAL_TABLET | SUBLINGUAL | 3 refills | Status: DC | PRN
Start: 2023-12-12 — End: 2023-12-15

## 2023-12-12 MED ORDER — PAROXETINE HCL 10 MG PO TABS: 10.0000 mg | ORAL_TABLET | Freq: Every day | ORAL | 1 refills | Status: DC

## 2023-12-12 NOTE — Assessment & Plan Note (Signed)
 Stressed lately On Diazepam  pr. Added Paroxetine GF is ill EKG w/S brady

## 2023-12-12 NOTE — Assessment & Plan Note (Signed)
 On Proscar  D/c Cialis  due to NTG use

## 2023-12-12 NOTE — Assessment & Plan Note (Addendum)
 Worse Stressed lately Pt states he has had 3 dizzy spells in the past few weeks a/w chest pains and needing to use his nitroglycerin   GF is ill  On Diazepam  pr. Added Paroxetine

## 2023-12-12 NOTE — Assessment & Plan Note (Signed)
 Stressed lately Pt states he has had 3 dizzy spells in the past few weeks a/w chest pains and needing to use his nitroglycerin   GF is ill EKG w/S brady

## 2023-12-12 NOTE — Progress Notes (Signed)
 Subjective:  Patient ID: [name], male    DOB: [birth year]  Age: 73 y.o. MRN: [id number]  CC: Medical Management of Chronic Issues (3 mnth f/u, Pt states he has had 3 dizzy spells in the past few weeks a/w chest pains and needing to use his nitroglycerin  )   HPI [name] presents for 3 month f/u, Pt states he has had 3 dizzy spells in the past few weeks a/w chest pains and needing to use his nitroglycerin   GF is ill C/o biting fingernails  Outpatient Medications Prior to Visit  Medication Sig Dispense Refill   aspirin  81 MG tablet Take 81 mg by mouth daily.     cholecalciferol  (VITAMIN D) 1000 UNITS tablet Take 2,000 Units by mouth daily.     fenofibrate  (TRICOR ) 48 MG tablet Take 1 tablet (48 mg total) by mouth daily. 90 tablet 3   levothyroxine  (SYNTHROID ) 88 MCG tablet Take 1 tablet (88 mcg total) by mouth daily before breakfast. 90 tablet 3   losartan  (COZAAR ) 100 MG tablet Take 1 tablet (100 mg total) by mouth daily. 90 tablet 3   Omega-3 Fatty Acids (FISH OIL ) 1000 MG CAPS Take 2 capsules (2,000 mg total) by mouth daily. 100 capsule 3   pantoprazole  (PROTONIX ) 40 MG tablet Take 1 tablet (40 mg total) by mouth daily as needed. 90 tablet 3   pravastatin  (PRAVACHOL ) 20 MG tablet Take 1 tablet (20 mg total) by mouth daily. 90 tablet 3   diazepam  (VALIUM ) 10 MG tablet TAKE 1/2 TO 1 (ONE-HALF TO ONE) TABLET BY MOUTH TWICE DAILY AS NEEDED FOR ANXIETY 60 tablet 2   ezetimibe  (ZETIA ) 10 MG tablet Take 1 tablet (10 mg total) by mouth daily. 90 tablet 3   finasteride  (PROSCAR ) 5 MG tablet Take 1 tablet (5 mg total) by mouth daily. 100 tablet 3   tadalafil  (CIALIS ) 5 MG tablet Take 1 tablet (5 mg total) by mouth daily. 30 tablet 11   No facility-administered medications prior to visit.    ROS: Review of Systems  Constitutional:  Positive for fatigue. Negative for appetite change and unexpected weight change.  HENT:  Negative for congestion, nosebleeds, sneezing, sore  throat and trouble swallowing.   Eyes:  Negative for itching and visual disturbance.  Respiratory:  Negative for cough.   Cardiovascular:  Negative for chest pain, palpitations and leg swelling.  Gastrointestinal:  Negative for abdominal distention, blood in stool, diarrhea and nausea.  Genitourinary:  Negative for frequency and hematuria.  Musculoskeletal:  Positive for arthralgias and back pain. Negative for gait problem, joint swelling and neck pain.  Skin:  Negative for rash.  Neurological:  Negative for dizziness, tremors, speech difficulty and weakness.  Psychiatric/Behavioral:  Negative for agitation, dysphoric mood, sleep disturbance and suicidal ideas. The patient is nervous/anxious.     Objective:  BP 110/70   Pulse (!) 59   Temp 98.3 F (36.8 C) (Oral)   Ht 5' 8.5" (1.74 m)   Wt 183 lb (83 kg)   SpO2 93%   BMI 27.42 kg/m   BP Readings from Last 3 Encounters:  12/12/23 110/70  09/09/23 102/60  06/06/23 118/66    Wt Readings from Last 3 Encounters:  12/12/23 183 lb (83 kg)  09/09/23 184 lb (83.5 kg)  07/21/23 180 lb (81.6 kg)    Physical Exam Constitutional:      General: He is not in acute distress.    Appearance: He is well-developed.  Comments: NAD  Eyes:     Conjunctiva/sclera: Conjunctivae normal.     Pupils: Pupils are equal, round, and reactive to light.  Neck:     Thyroid : No thyromegaly.     Vascular: No JVD.  Cardiovascular:     Rate and Rhythm: Regular rhythm. Bradycardia present.     Heart sounds: Normal heart sounds. No murmur heard.    No friction rub. No gallop.  Pulmonary:     Effort: Pulmonary effort is normal. No respiratory distress.     Breath sounds: Normal breath sounds. No wheezing or rales.  Chest:     Chest wall: No tenderness.  Abdominal:     General: Bowel sounds are normal. There is no distension.     Palpations: Abdomen is soft. There is no mass.     Tenderness: There is no abdominal tenderness. There is no guarding or  rebound.  Musculoskeletal:        General: No tenderness. Normal range of motion.     Cervical back: Normal range of motion.  Lymphadenopathy:     Cervical: No cervical adenopathy.  Skin:    General: Skin is warm and dry.     Findings: No rash.  Neurological:     Mental Status: He is alert and oriented to person, place, and time.     Cranial Nerves: No cranial nerve deficit.     Motor: No abnormal muscle tone.     Coordination: Coordination normal.     Gait: Gait normal.     Deep Tendon Reflexes: Reflexes are normal and symmetric.  Psychiatric:        Behavior: Behavior normal.        Thought Content: Thought content normal.        Judgment: Judgment normal.   Procedure: EKG Indication: chest pain, dizzy Impression: S brady 57 BPM. 1st degree AV block. No acute changes.   Lab Results  Component Value Date   WBC 4.6 06/06/2023   HGB 12.7 (L) 06/06/2023   HCT 38.2 (L) 06/06/2023   PLT 197.0 06/06/2023   GLUCOSE 97 06/06/2023   CHOL 145 06/06/2023   TRIG 305.0 (H) 06/06/2023   HDL 41.30 06/06/2023   LDLDIRECT 36.0 09/14/2022   LDLCALC 43 06/06/2023   ALT 38 06/06/2023   AST 37 06/06/2023   NA 133 (L) 06/06/2023   K 4.1 06/06/2023   CL 100 06/06/2023   CREATININE 0.89 06/06/2023   BUN 9 06/06/2023   CO2 25 06/06/2023   TSH 3.06 06/06/2023   PSA 1.43 06/06/2023    CT Chest Wo Contrast Result Date: 03/20/2020 CLINICAL DATA:  COPD, previous tobacco abuse, pulmonary nodule EXAM: CT CHEST WITHOUT CONTRAST TECHNIQUE: Multidetector CT imaging of the chest was performed following the standard protocol without IV contrast. COMPARISON:  11/30/2017 FINDINGS: Cardiovascular: Unenhanced imaging of the heart and great vessels demonstrates no pericardial effusion. 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 Mild atherosclerosis of the aortic arch. Normal caliber of the thoracic aorta. Mediastinum/Nodes: No enlarged mediastinal or axillary lymph nodes. Thyroid   gland, trachea, and esophagus demonstrate no significant findings. Lungs/Pleura: Diffuse upper lobe predominant emphysema. No acute airspace disease, effusion, or pneumothorax. Multiple small noncalcified pulmonary nodules are again identified, unchanged since prior exam. Index nodules are as follows: Right upper lobe, image 31, 6 mm.  Stable. Left lower lobe, image 116, 4 mm.  Stable. A right middle lobe 3 mm nodule seen previously has calcified in the interim, consistent with granuloma. No new nodules or  masses. Central airways are patent. Upper Abdomen: No acute abnormality. Musculoskeletal: No acute or destructive bony lesions. Reconstructed images demonstrate no additional findings. IMPRESSION: 1. Stable subcentimeter pulmonary nodules as above, largest measuring 6 mm. Given the greater than 2 year stability, no further imaging workup is required. This recommendation follows the consensus statement: Guidelines for Management of Incidental Pulmonary Nodules Detected on CT Images: From the Fleischner Society 2017; Radiology 2017; 284:228-243. 2. No acute intrathoracic process. 3. Aortic Atherosclerosis (ICD10-I70.0) and Emphysema (ICD10-J43.9). Electronically Signed   By: Bobbye Burrow M.D.   On: 03/20/2020 23:42    Assessment & Plan:   Problem List Items Addressed This Visit     Anxiety disorder   Worse Stressed lately Pt states he has had 3 dizzy spells in the past few weeks a/w chest pains and needing to use his nitroglycerin   GF is ill  On Diazepam  pr. Added Paroxetine       Relevant Medications   diazepam  (VALIUM ) 10 MG tablet   PARoxetine (PAXIL) 10 MG tablet   Chest pain - Primary   Stressed lately Pt states he has had 3 dizzy spells in the past few weeks a/w chest pains and needing to use his nitroglycerin   GF is ill EKG w/S brady      Relevant Orders   EKG 12-Lead   Ambulatory referral to Cardiology   BPH (benign prostatic hyperplasia)   On Proscar  D/c Cialis  due to NTG  use      Relevant Medications   finasteride  (PROSCAR ) 5 MG tablet   Stress at home   Stressed lately On Diazepam  pr. Added Paroxetine GF is ill EKG w/S brady         Meds ordered this encounter  Medications   diazepam  (VALIUM ) 10 MG tablet    Sig: TAKE 1/2 TO 1 (ONE-HALF TO ONE) TABLET BY MOUTH TWICE DAILY AS NEEDED FOR ANXIETY    Dispense:  60 tablet    Refill:  2   PARoxetine (PAXIL) 10 MG tablet    Sig: Take 1 tablet (10 mg total) by mouth daily.    Dispense:  90 tablet    Refill:  1   nitroGLYCERIN  (NITROSTAT ) 0.4 MG SL tablet    Sig: Place 1 tablet (0.4 mg total) under the tongue every 5 (five) minutes as needed for chest pain.    Dispense:  20 tablet    Refill:  3   ezetimibe  (ZETIA ) 10 MG tablet    Sig: Take 1 tablet (10 mg total) by mouth daily.    Dispense:  90 tablet    Refill:  3   finasteride  (PROSCAR ) 5 MG tablet    Sig: Take 1 tablet (5 mg total) by mouth daily.    Dispense:  100 tablet    Refill:  3      Follow-up: Return in about 3 months (around 03/13/2024) for a follow-up visit.  Anitra Barn, MD

## 2023-12-15 LAB — TSH: TSH: 4.21 u[IU]/mL (ref 0.35–5.50)

## 2023-12-19 ENCOUNTER — Ambulatory Visit: Payer: Self-pay | Admitting: Internal Medicine

## 2023-12-20 ENCOUNTER — Telehealth: Payer: Self-pay | Admitting: Internal Medicine

## 2023-12-20 NOTE — Telephone Encounter (Signed)
 Copied from CRM [id number]. Topic: Clinical - Prescription Issue >> Dec 20, 2023 10:51 AM Lajean Pike wrote: Reason for CRM: Rep from [facility] [phone] called in to verify the tadalafil  refill and nitrogylcerin script that was sent over. She wants to verify if the patient discontinued the nitrogylcerin because if not, the medications are contraindicated and that can pose issues for the patient.

## 2023-12-22 NOTE — Telephone Encounter (Signed)
 We discontinued tadalafil  on his last office visit.  Thanks

## 2023-12-27 DIAGNOSIS — H7292 Unspecified perforation of tympanic membrane, left ear: Secondary | ICD-10-CM | POA: Diagnosis not present

## 2023-12-27 DIAGNOSIS — H903 Sensorineural hearing loss, bilateral: Secondary | ICD-10-CM | POA: Diagnosis not present

## 2024-01-03 NOTE — Telephone Encounter (Signed)
 Copied from CRM [id number]. Topic: Clinical - Prescription Issue >> Jan 03, 2024 12:23 PM Rosina BIRCH wrote: Mary from [facility] called wanting to know it was ok to dispense the tadalafil  because the patient has a script for nitroglycerin . Ronal stated it can both lower blood pressure and it is contraindicated CB [phone]

## 2024-01-05 NOTE — Telephone Encounter (Signed)
 Nitroglycerin  was stopped.  Thanks

## 2024-01-09 NOTE — Telephone Encounter (Signed)
 Spoke with pharmacy today and script is ready for pick up.

## 2024-03-12 ENCOUNTER — Ambulatory Visit

## 2024-03-13 ENCOUNTER — Encounter: Payer: Self-pay | Admitting: Internal Medicine

## 2024-03-13 ENCOUNTER — Ambulatory Visit: Admitting: Internal Medicine

## 2024-03-13 VITALS — BP 114/68 | HR 87 | Temp 97.7°F | Ht 68.5 in | Wt 183.2 lb

## 2024-03-13 DIAGNOSIS — I2510 Atherosclerotic heart disease of native coronary artery without angina pectoris: Secondary | ICD-10-CM | POA: Diagnosis not present

## 2024-03-13 DIAGNOSIS — E785 Hyperlipidemia, unspecified: Secondary | ICD-10-CM

## 2024-03-13 DIAGNOSIS — E039 Hypothyroidism, unspecified: Secondary | ICD-10-CM

## 2024-03-13 DIAGNOSIS — F411 Generalized anxiety disorder: Secondary | ICD-10-CM

## 2024-03-13 DIAGNOSIS — F329 Major depressive disorder, single episode, unspecified: Secondary | ICD-10-CM | POA: Diagnosis not present

## 2024-03-13 DIAGNOSIS — I2583 Coronary atherosclerosis due to lipid rich plaque: Secondary | ICD-10-CM

## 2024-03-13 DIAGNOSIS — F439 Reaction to severe stress, unspecified: Secondary | ICD-10-CM

## 2024-03-13 MED ORDER — DIAZEPAM 10 MG PO TABS: ORAL_TABLET | ORAL | 2 refills | Status: DC

## 2024-03-13 NOTE — Assessment & Plan Note (Signed)
 Better on Paxil

## 2024-03-13 NOTE — Assessment & Plan Note (Signed)
Cont on Levothroid 

## 2024-03-13 NOTE — Progress Notes (Signed)
 Subjective:  Patient ID: [name], male    DOB: 10-Mar-1951  Age: 73 y.o. MRN: [id number]  CC: Follow-up (Patient wants to discuss another cardiologist referral.  Other heart Dr not in network. Asking for ears to be checked. )   HPI [name] presents for anxiety - better on Paxil  F/u on CAD  Outpatient Medications Prior to Visit  Medication Sig Dispense Refill   aspirin  81 MG tablet Take 81 mg by mouth daily.     cholecalciferol  (VITAMIN D) 1000 UNITS tablet Take 2,000 Units by mouth daily.     ezetimibe  (ZETIA ) 10 MG tablet Take 1 tablet (10 mg total) by mouth daily. 90 tablet 3   fenofibrate  (TRICOR ) 48 MG tablet Take 1 tablet (48 mg total) by mouth daily. 90 tablet 3   finasteride  (PROSCAR ) 5 MG tablet Take 1 tablet (5 mg total) by mouth daily. 100 tablet 3   levothyroxine  (SYNTHROID ) 88 MCG tablet Take 1 tablet (88 mcg total) by mouth daily before breakfast. 90 tablet 3   losartan  (COZAAR ) 100 MG tablet Take 1 tablet (100 mg total) by mouth daily. 90 tablet 3   Omega-3 Fatty Acids (FISH OIL ) 1000 MG CAPS Take 2 capsules (2,000 mg total) by mouth daily. 100 capsule 3   pantoprazole  (PROTONIX ) 40 MG tablet Take 1 tablet (40 mg total) by mouth daily as needed. 90 tablet 3   PARoxetine  (PAXIL ) 10 MG tablet Take 1 tablet (10 mg total) by mouth daily. 90 tablet 1   pravastatin  (PRAVACHOL ) 20 MG tablet Take 1 tablet (20 mg total) by mouth daily. 90 tablet 3   diazepam  (VALIUM ) 10 MG tablet TAKE 1/2 TO 1 (ONE-HALF TO ONE) TABLET BY MOUTH TWICE DAILY AS NEEDED FOR ANXIETY 60 tablet 2   No facility-administered medications prior to visit.    ROS: Review of Systems  Constitutional:  Negative for appetite change, fatigue and unexpected weight change.  HENT:  Negative for congestion, nosebleeds, sneezing, sore throat and trouble swallowing.   Eyes:  Negative for itching and visual disturbance.  Respiratory:  Negative for cough.   Cardiovascular:  Negative for chest pain,  palpitations and leg swelling.  Gastrointestinal:  Negative for abdominal distention, blood in stool, diarrhea and nausea.  Genitourinary:  Negative for frequency and hematuria.  Musculoskeletal:  Positive for arthralgias. Negative for back pain, gait problem, joint swelling and neck pain.  Skin:  Negative for rash.  Neurological:  Negative for dizziness, tremors, speech difficulty and weakness.  Psychiatric/Behavioral:  Negative for agitation, dysphoric mood, sleep disturbance and suicidal ideas. The patient is nervous/anxious.     Objective:  BP 114/68   Pulse 87   Temp 97.7 F (36.5 C) (Oral)   Ht 5' 8.5 (1.74 m)   Wt 183 lb 3.2 oz (83.1 kg)   SpO2 96%   BMI 27.45 kg/m   BP Readings from Last 3 Encounters:  03/13/24 114/68  12/12/23 110/70  09/09/23 102/60    Wt Readings from Last 3 Encounters:  03/13/24 183 lb 3.2 oz (83.1 kg)  12/12/23 183 lb (83 kg)  09/09/23 184 lb (83.5 kg)    Physical Exam Constitutional:      General: He is not in acute distress.    Appearance: Normal appearance. He is well-developed.     Comments: NAD  Eyes:     Conjunctiva/sclera: Conjunctivae normal.     Pupils: Pupils are equal, round, and reactive to light.  Neck:  Thyroid : No thyromegaly.     Vascular: No JVD.  Cardiovascular:     Rate and Rhythm: Normal rate and regular rhythm.     Heart sounds: Normal heart sounds. No murmur heard.    No friction rub. No gallop.  Pulmonary:     Effort: Pulmonary effort is normal. No respiratory distress.     Breath sounds: Normal breath sounds. No wheezing or rales.  Chest:     Chest wall: No tenderness.  Abdominal:     General: Bowel sounds are normal. There is no distension.     Palpations: Abdomen is soft. There is no mass.     Tenderness: There is no abdominal tenderness. There is no guarding or rebound.  Musculoskeletal:        General: No tenderness. Normal range of motion.     Cervical back: Normal range of motion.   Lymphadenopathy:     Cervical: No cervical adenopathy.  Skin:    General: Skin is warm and dry.     Findings: No rash.  Neurological:     Mental Status: He is alert and oriented to person, place, and time.     Cranial Nerves: No cranial nerve deficit.     Motor: No abnormal muscle tone.     Coordination: Coordination normal.     Gait: Gait normal.     Deep Tendon Reflexes: Reflexes are normal and symmetric.  Psychiatric:        Behavior: Behavior normal.        Thought Content: Thought content normal.        Judgment: Judgment normal.   Mild tremor  Lab Results  Component Value Date   WBC 4.6 06/06/2023   HGB 12.7 (L) 06/06/2023   HCT 38.2 (L) 06/06/2023   PLT 197.0 06/06/2023   GLUCOSE 96 12/12/2023   CHOL 143 12/12/2023   TRIG 278.0 (H) 12/12/2023   HDL 48.70 12/12/2023   LDLDIRECT 36.0 09/14/2022   LDLCALC 39 12/12/2023   ALT 19 12/12/2023   AST 24 12/12/2023   NA 136 12/12/2023   K 3.9 12/12/2023   CL 103 12/12/2023   CREATININE 0.98 12/12/2023   BUN 12 12/12/2023   CO2 25 12/12/2023   TSH 4.21 12/12/2023   PSA 1.43 06/06/2023    CT Chest Wo Contrast Result Date: 03/20/2020 CLINICAL DATA:  COPD, previous tobacco abuse, pulmonary nodule EXAM: CT CHEST WITHOUT CONTRAST TECHNIQUE: Multidetector CT imaging of the chest was performed following the standard protocol without IV contrast. COMPARISON:  11/30/2017 FINDINGS: Cardiovascular: Unenhanced imaging of the heart and great vessels demonstrates no pericardial effusion. 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 Mild atherosclerosis of the aortic arch. Normal caliber of the thoracic aorta. Mediastinum/Nodes: No enlarged mediastinal or axillary lymph nodes. Thyroid  gland, trachea, and esophagus demonstrate no significant findings. Lungs/Pleura: Diffuse upper lobe predominant emphysema. No acute airspace disease, effusion, or pneumothorax. Multiple small noncalcified pulmonary nodules are again  identified, unchanged since prior exam. Index nodules are as follows: Right upper lobe, image 31, 6 mm.  Stable. Left lower lobe, image 116, 4 mm.  Stable. A right middle lobe 3 mm nodule seen previously has calcified in the interim, consistent with granuloma. No new nodules or masses. Central airways are patent. Upper Abdomen: No acute abnormality. Musculoskeletal: No acute or destructive bony lesions. Reconstructed images demonstrate no additional findings. IMPRESSION: 1. Stable subcentimeter pulmonary nodules as above, largest measuring 6 mm. Given the greater than 2 year stability, no further imaging workup is  required. This recommendation follows the consensus statement: Guidelines for Management of Incidental Pulmonary Nodules Detected on CT Images: From the Fleischner Society 2017; Radiology 2017; 284:228-243. 2. No acute intrathoracic process. 3. Aortic Atherosclerosis (ICD10-I70.0) and Emphysema (ICD10-J43.9). Electronically Signed   By: Ozell Daring M.D.   On: 03/20/2020 23:42    Assessment & Plan:   Problem List Items Addressed This Visit     Anxiety disorder   Better on Paxil       Relevant Medications   diazepam  (VALIUM ) 10 MG tablet   CAD (coronary artery disease)   Cont on  Pravastatin , Zetia , Fish oil  No CP Not using NTG Cardiology ref -  Dr Donnice Primus        Relevant Orders   Ambulatory referral to Cardiology   Depression - Primary   Better on Paxil       Relevant Medications   diazepam  (VALIUM ) 10 MG tablet   Hypothyroidism   Cont on Levothroid      Stress at home   Better on Paxil       Other Visit Diagnoses       Dyslipidemia             Meds ordered this encounter  Medications   diazepam  (VALIUM ) 10 MG tablet    Sig: TAKE 1/2 TO 1 (ONE-HALF TO ONE) TABLET BY MOUTH TWICE DAILY AS NEEDED FOR ANXIETY    Dispense:  60 tablet    Refill:  2      Follow-up: Return in about 3 months (around 06/12/2024) for a follow-up visit.  Marolyn Noel,  MD

## 2024-03-13 NOTE — Assessment & Plan Note (Signed)
 Cont on  Pravastatin , Zetia , Fish oil  No CP Not using NTG Cardiology ref -  Dr Donnice Primus

## 2024-05-07 ENCOUNTER — Encounter: Payer: Self-pay | Admitting: Family Medicine

## 2024-05-07 ENCOUNTER — Telehealth: Payer: Self-pay

## 2024-05-07 ENCOUNTER — Telehealth (INDEPENDENT_AMBULATORY_CARE_PROVIDER_SITE_OTHER): Admitting: Family Medicine

## 2024-05-07 VITALS — Ht 68.5 in | Wt 183.0 lb

## 2024-05-07 DIAGNOSIS — J441 Chronic obstructive pulmonary disease with (acute) exacerbation: Secondary | ICD-10-CM

## 2024-05-07 MED ORDER — PREDNISONE 10 MG (21) PO TBPK: ORAL_TABLET | ORAL | 0 refills | Status: AC

## 2024-05-07 MED ORDER — AZITHROMYCIN 250 MG PO TABS: ORAL_TABLET | ORAL | 0 refills | Status: AC

## 2024-05-07 MED ORDER — AZITHROMYCIN 250 MG PO TABS: ORAL_TABLET | ORAL | 0 refills | Status: DC

## 2024-05-07 NOTE — Progress Notes (Signed)
 Virtual Visit via Video Note  I connected with [name] on 05/07/24 at  1:40 PM EST by a video enabled telemedicine application and verified that I am speaking with the correct person using two identifiers.  Patient Location: Other:  Hospital with significant other Provider Location: Office/Clinic  I discussed the limitations, risks, security, and privacy concerns of performing an evaluation and management service by video and the availability of in person appointments. I also discussed with the patient that there may be a patient responsible charge related to this service. The patient expressed understanding and agreed to proceed.  Subjective: PCP: Plotnikov, Karlynn GAILS, MD  Chief Complaint  Patient presents with   Cough    Congestion-light brown and sometimes clear, Cough, sinus pressure, diarrhea, St - better, SAT had a fever, better since Taking OTC mucinex and delsym  - started after blowing leaves 3-4 days ago     Discussed the use of AI scribe software for clinical note transcription with the patient, who gave verbal consent to proceed.  He has been experiencing congestion, cough, sinus pressure, and a sore throat. Initially, these symptoms were accompanied by a fever and chills, but they have improved somewhat. He has been taking Mucinex and Delsym to manage these symptoms.  His cough is now productive with clear/light-brown sputum. He quit smoking 12 years ago. He has a history of recurrent bronchitis, particularly in the fall, for which he has previously been prescribed a Z-Pak. He recalls having bronchitis three or four times in the past.  He confirms that he is staying hydrated by drinking plenty of fluids. No current smoking.       ROS: Per HPI  Current Outpatient Medications:    aspirin  81 MG tablet, Take 81 mg by mouth daily., Disp: , Rfl:    azithromycin  (ZITHROMAX ) 250 MG tablet, Take 2 tablets (500 mg total) by mouth daily for 1 day, THEN 1 tablet (250 mg  total) daily for 4 days., Disp: 6 each, Rfl: 0   cholecalciferol  (VITAMIN D) 1000 UNITS tablet, Take 2,000 Units by mouth daily., Disp: , Rfl:    diazepam  (VALIUM ) 10 MG tablet, TAKE 1/2 TO 1 (ONE-HALF TO ONE) TABLET BY MOUTH TWICE DAILY AS NEEDED FOR ANXIETY, Disp: 60 tablet, Rfl: 2   ezetimibe  (ZETIA ) 10 MG tablet, Take 1 tablet (10 mg total) by mouth daily., Disp: 90 tablet, Rfl: 3   fenofibrate  (TRICOR ) 48 MG tablet, Take 1 tablet (48 mg total) by mouth daily., Disp: 90 tablet, Rfl: 3   finasteride  (PROSCAR ) 5 MG tablet, Take 1 tablet (5 mg total) by mouth daily., Disp: 100 tablet, Rfl: 3   levothyroxine  (SYNTHROID ) 88 MCG tablet, Take 1 tablet (88 mcg total) by mouth daily before breakfast., Disp: 90 tablet, Rfl: 3   losartan  (COZAAR ) 100 MG tablet, Take 1 tablet (100 mg total) by mouth daily., Disp: 90 tablet, Rfl: 3   Omega-3 Fatty Acids (FISH OIL ) 1000 MG CAPS, Take 2 capsules (2,000 mg total) by mouth daily., Disp: 100 capsule, Rfl: 3   pantoprazole  (PROTONIX ) 40 MG tablet, Take 1 tablet (40 mg total) by mouth daily as needed., Disp: 90 tablet, Rfl: 3   PARoxetine  (PAXIL ) 10 MG tablet, Take 1 tablet (10 mg total) by mouth daily., Disp: 90 tablet, Rfl: 1   pravastatin  (PRAVACHOL ) 20 MG tablet, Take 1 tablet (20 mg total) by mouth daily., Disp: 90 tablet, Rfl: 3   predniSONE  (STERAPRED UNI-PAK 21 TAB) 10 MG (21) TBPK tablet,  As directed x 6 days, Disp: 21 tablet, Rfl: 0  Observations/Objective: Today's Vitals   05/07/24 1329  Weight: 183 lb (83 kg)  Height: 5' 8.5 (1.74 m)   Physical Exam Constitutional:      General: He is not in acute distress.    Appearance: Normal appearance. He is not ill-appearing or toxic-appearing.  Eyes:     General: No scleral icterus.       Right eye: No discharge.        Left eye: No discharge.     Conjunctiva/sclera: Conjunctivae normal.  Pulmonary:     Effort: Pulmonary effort is normal. No respiratory distress.  Neurological:     Mental Status:  He is alert and oriented to person, place, and time.  Psychiatric:        Mood and Affect: Mood normal.        Behavior: Behavior normal.        Thought Content: Thought content normal.        Judgment: Judgment normal.     Assessment & Plan COPD exacerbation (HCC) - Continue Mucinex and Delsym as needed. - Education provided on COPD exacerbations Orders:   predniSONE  (STERAPRED UNI-PAK 21 TAB) 10 MG (21) TBPK tablet; As directed x 6 days   azithromycin  (ZITHROMAX ) 250 MG tablet; Take 2 tablets (500 mg total) by mouth daily for 1 day, THEN 1 tablet (250 mg total) daily for 4 days.    Follow Up Instructions: Return if symptoms worsen or fail to improve.   I discussed the assessment and treatment plan with the patient. The patient was provided an opportunity to ask questions, and all were answered. The patient agreed with the plan and demonstrated an understanding of the instructions.   The patient was advised to call back or seek an in-person evaluation if the symptoms worsen or if the condition fails to improve as anticipated.  The above assessment and management plan was discussed with the patient. The patient verbalized understanding of and has agreed to the management plan.   Niki JULIANNA Rung, FNP

## 2024-05-07 NOTE — Telephone Encounter (Signed)
Ok Thx 

## 2024-05-07 NOTE — Telephone Encounter (Signed)
 Copied from CRM [id number]. Topic: General - Other >> May 07, 2024  9:51 AM Rosina BIRCH wrote: Reason for CRM: patient called stating he is having a head cold and chest congestion and want the provider to call him in a zpack. Patient stated the doctor has done it before. [phone]

## 2024-05-07 NOTE — Addendum Note (Signed)
 Addended by: Alila Sotero V on: 05/07/2024 10:40 PM   Modules accepted: Orders

## 2024-05-11 ENCOUNTER — Other Ambulatory Visit: Payer: Self-pay | Admitting: Internal Medicine

## 2024-06-02 ENCOUNTER — Other Ambulatory Visit: Payer: Self-pay | Admitting: Internal Medicine

## 2024-06-05 ENCOUNTER — Other Ambulatory Visit: Payer: Self-pay | Admitting: Internal Medicine

## 2024-06-12 ENCOUNTER — Encounter: Payer: Self-pay | Admitting: Internal Medicine

## 2024-06-12 ENCOUNTER — Ambulatory Visit: Admitting: Internal Medicine

## 2024-06-12 VITALS — BP 154/82 | HR 67 | Temp 97.9°F | Ht 68.5 in | Wt 182.2 lb

## 2024-06-12 DIAGNOSIS — I2510 Atherosclerotic heart disease of native coronary artery without angina pectoris: Secondary | ICD-10-CM

## 2024-06-12 DIAGNOSIS — F411 Generalized anxiety disorder: Secondary | ICD-10-CM

## 2024-06-12 DIAGNOSIS — Z0000 Encounter for general adult medical examination without abnormal findings: Secondary | ICD-10-CM | POA: Diagnosis not present

## 2024-06-12 DIAGNOSIS — F329 Major depressive disorder, single episode, unspecified: Secondary | ICD-10-CM

## 2024-06-12 DIAGNOSIS — D485 Neoplasm of uncertain behavior of skin: Secondary | ICD-10-CM

## 2024-06-12 DIAGNOSIS — Z52008 Unspecified donor, other blood: Secondary | ICD-10-CM

## 2024-06-12 DIAGNOSIS — I2583 Coronary atherosclerosis due to lipid rich plaque: Secondary | ICD-10-CM

## 2024-06-12 LAB — COMPREHENSIVE METABOLIC PANEL WITH GFR
ALT: 29 U/L (ref 0–53)
AST: 34 U/L (ref 0–37)
Albumin: 4.7 g/dL (ref 3.5–5.2)
Alkaline Phosphatase: 48 U/L (ref 39–117)
BUN: 11 mg/dL (ref 6–23)
CO2: 27 meq/L (ref 19–32)
Calcium: 9.7 mg/dL (ref 8.4–10.5)
Chloride: 98 meq/L (ref 96–112)
Creatinine, Ser: 0.97 mg/dL (ref 0.40–1.50)
GFR: 77.55 mL/min (ref >60.00)
Glucose, Bld: 92 mg/dL (ref 70–99)
Potassium: 4.1 meq/L (ref 3.5–5.1)
Sodium: 135 meq/L (ref 135–145)
Total Bilirubin: 0.4 mg/dL (ref 0.2–1.2)
Total Protein: 8.2 g/dL (ref 6.0–8.3)

## 2024-06-12 LAB — URINALYSIS
Bilirubin Urine: NEGATIVE
Hgb urine dipstick: NEGATIVE
Ketones, ur: NEGATIVE
Leukocytes,Ua: NEGATIVE
Nitrite: NEGATIVE
Specific Gravity, Urine: <=1.005 — AB (ref 1.000–1.030)
Total Protein, Urine: NEGATIVE
Urine Glucose: NEGATIVE
Urobilinogen, UA: 0.2 (ref 0.0–1.0)
pH: 6.5 (ref 5.0–8.0)

## 2024-06-12 LAB — LIPID PANEL
Cholesterol: 164 mg/dL (ref 0–200)
HDL: 59 mg/dL (ref >39.00)
LDL Cholesterol: 43 mg/dL (ref 0–99)
NonHDL: 104.59
Total CHOL/HDL Ratio: 3
Triglycerides: 308 mg/dL — ABNORMAL HIGH (ref 0.0–149.0)
VLDL: 61.6 mg/dL — ABNORMAL HIGH (ref 0.0–40.0)

## 2024-06-12 LAB — CBC WITH DIFFERENTIAL/PLATELET
Basophils Absolute: 0.1 K/uL (ref 0.0–0.1)
Basophils Relative: 0.8 % (ref 0.0–3.0)
Eosinophils Absolute: 0.1 K/uL (ref 0.0–0.7)
Eosinophils Relative: 1.2 % (ref 0.0–5.0)
HCT: 37.8 % — ABNORMAL LOW (ref 39.0–52.0)
Hemoglobin: 12.6 g/dL — ABNORMAL LOW (ref 13.0–17.0)
Lymphocytes Relative: 23.9 % (ref 12.0–46.0)
Lymphs Abs: 1.5 K/uL (ref 0.7–4.0)
MCHC: 33.4 g/dL (ref 30.0–36.0)
MCV: 79.8 fl (ref 78.0–100.0)
Monocytes Absolute: 0.7 K/uL (ref 0.1–1.0)
Monocytes Relative: 10.6 % (ref 3.0–12.0)
Neutro Abs: 4 K/uL (ref 1.4–7.7)
Neutrophils Relative %: 63.5 % (ref 43.0–77.0)
Platelets: 198 K/uL (ref 150.0–400.0)
RBC: 4.74 Mil/uL (ref 4.22–5.81)
RDW: 18.4 % — ABNORMAL HIGH (ref 11.5–15.5)
WBC: 6.2 K/uL (ref 4.0–10.5)

## 2024-06-12 LAB — TSH: TSH: 3.14 u[IU]/mL (ref 0.35–5.50)

## 2024-06-12 LAB — PSA: PSA: 1.32 ng/mL (ref 0.10–4.00)

## 2024-06-12 NOTE — Assessment & Plan Note (Signed)
 Better on Paxil

## 2024-06-12 NOTE — Assessment & Plan Note (Signed)
 Last blood donation 6 mo ago

## 2024-06-12 NOTE — Assessment & Plan Note (Addendum)
  We discussed age appropriate health related issues, including available/recomended screening tests and vaccinations. Labs were ordered to be later reviewed . All questions were answered. We discussed one or more of the following - seat belt use, use of sunscreen/sun exposure exercise, safe sex, fall risk reduction, second hand smoke exposure, firearm use and storage, seat belt use, a need for adhering to healthy diet and exercise. Labs were ordered.  All questions were answered. Colon done in 2013, 2019 Dr Avram Rectal exam - pt deferred He can get RSV vaccine

## 2024-06-12 NOTE — Progress Notes (Signed)
 Subjective:  Patient ID: [name], male    DOB: December 28, 1950  Age: 73 y.o. MRN: [id number]  CC: Annual Exam (Annual Exam)   HPI [name] presents for a well exam F/u on CAD, anxiety  Outpatient Medications Prior to Visit  Medication Sig Dispense Refill   aspirin  81 MG tablet Take 81 mg by mouth daily.     cholecalciferol  (VITAMIN D) 1000 UNITS tablet Take 2,000 Units by mouth daily.     diazepam  (VALIUM ) 10 MG tablet TAKE 1/2 TO 1 (ONE-HALF TO ONE) BY MOUTH TWICE DAILY AS NEEDED FOR ANXIETY 60 tablet 3   ezetimibe  (ZETIA ) 10 MG tablet Take 1 tablet (10 mg total) by mouth daily. 90 tablet 3   fenofibrate  (TRICOR ) 48 MG tablet Take 1 tablet (48 mg total) by mouth daily. 90 tablet 3   finasteride  (PROSCAR ) 5 MG tablet Take 1 tablet (5 mg total) by mouth daily. 100 tablet 3   levothyroxine  (SYNTHROID ) 88 MCG tablet Take 1 tablet (88 mcg total) by mouth daily before breakfast. 90 tablet 3   losartan  (COZAAR ) 100 MG tablet Take 1 tablet (100 mg total) by mouth daily. 90 tablet 3   Omega-3 Fatty Acids (FISH OIL ) 1000 MG CAPS Take 2 capsules (2,000 mg total) by mouth daily. 100 capsule 3   pantoprazole  (PROTONIX ) 40 MG tablet Take 1 tablet (40 mg total) by mouth daily as needed. 90 tablet 3   PARoxetine  (PAXIL ) 10 MG tablet Take 1 tablet by mouth once daily 90 tablet 1   pravastatin  (PRAVACHOL ) 20 MG tablet Take 1 tablet (20 mg total) by mouth daily. 90 tablet 3   predniSONE  (STERAPRED UNI-PAK 21 TAB) 10 MG (21) TBPK tablet As directed x 6 days 21 tablet 0   No facility-administered medications prior to visit.    ROS: Review of Systems  Constitutional:  Negative for appetite change, fatigue and unexpected weight change.  HENT:  Negative for congestion, nosebleeds, sneezing, sore throat and trouble swallowing.   Eyes:  Negative for itching and visual disturbance.  Respiratory:  Negative for cough.   Cardiovascular:  Negative for chest pain, palpitations and leg swelling.   Gastrointestinal:  Negative for abdominal distention, blood in stool, diarrhea and nausea.  Genitourinary:  Negative for frequency and hematuria.  Musculoskeletal:  Positive for back pain. Negative for gait problem, joint swelling and neck pain.  Skin:  Negative for rash.  Neurological:  Negative for dizziness, tremors, speech difficulty and weakness.  Psychiatric/Behavioral:  Positive for dysphoric mood. Negative for agitation, sleep disturbance and suicidal ideas. The patient is nervous/anxious.     Objective:  BP (!) 154/82   Pulse 67   Temp 97.9 F (36.6 C)   Ht 5' 8.5 (1.74 m)   Wt 182 lb 3.2 oz (82.6 kg)   SpO2 99%   BMI 27.30 kg/m   BP Readings from Last 3 Encounters:  06/12/24 (!) 154/82  03/13/24 114/68  12/12/23 110/70    Wt Readings from Last 3 Encounters:  06/12/24 182 lb 3.2 oz (82.6 kg)  05/07/24 183 lb (83 kg)  03/13/24 183 lb 3.2 oz (83.1 kg)    Physical Exam Constitutional:      General: He is not in acute distress.    Appearance: Normal appearance. He is well-developed.     Comments: NAD  Eyes:     Conjunctiva/sclera: Conjunctivae normal.     Pupils: Pupils are equal, round, and reactive to light.  Neck:  Thyroid : No thyromegaly.     Vascular: No JVD.  Cardiovascular:     Rate and Rhythm: Normal rate and regular rhythm.     Heart sounds: Normal heart sounds. No murmur heard.    No friction rub. No gallop.  Pulmonary:     Effort: Pulmonary effort is normal. No respiratory distress.     Breath sounds: Normal breath sounds. No wheezing or rales.  Chest:     Chest wall: No tenderness.  Abdominal:     General: Bowel sounds are normal. There is no distension.     Palpations: Abdomen is soft. There is no mass.     Tenderness: There is no abdominal tenderness. There is no guarding or rebound.  Musculoskeletal:        General: Tenderness present. Normal range of motion.     Cervical back: Normal range of motion.  Lymphadenopathy:     Cervical:  No cervical adenopathy.  Skin:    General: Skin is warm and dry.     Findings: No rash.  Neurological:     Mental Status: He is alert and oriented to person, place, and time.     Cranial Nerves: No cranial nerve deficit.     Motor: No abnormal muscle tone.     Coordination: Coordination normal.     Gait: Gait normal.     Deep Tendon Reflexes: Reflexes are normal and symmetric.  Psychiatric:        Behavior: Behavior normal.        Thought Content: Thought content normal.        Judgment: Judgment normal.   Rectal exam - pt deferred Mole between eyebrows - irreg color  Lab Results  Component Value Date   WBC 4.6 06/06/2023   HGB 12.7 (L) 06/06/2023   HCT 38.2 (L) 06/06/2023   PLT 197.0 06/06/2023   GLUCOSE 96 12/12/2023   CHOL 143 12/12/2023   TRIG 278.0 (H) 12/12/2023   HDL 48.70 12/12/2023   LDLDIRECT 36.0 09/14/2022   LDLCALC 39 12/12/2023   ALT 19 12/12/2023   AST 24 12/12/2023   NA 136 12/12/2023   K 3.9 12/12/2023   CL 103 12/12/2023   CREATININE 0.98 12/12/2023   BUN 12 12/12/2023   CO2 25 12/12/2023   TSH 4.21 12/12/2023   PSA 1.43 06/06/2023    CT Chest Wo Contrast Result Date: 03/20/2020 CLINICAL DATA:  COPD, previous tobacco abuse, pulmonary nodule EXAM: CT CHEST WITHOUT CONTRAST TECHNIQUE: Multidetector CT imaging of the chest was performed following the standard protocol without IV contrast. COMPARISON:  11/30/2017 FINDINGS: Cardiovascular: Unenhanced imaging of the heart and great vessels demonstrates no pericardial effusion. 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 Mild atherosclerosis of the aortic arch. Normal caliber of the thoracic aorta. Mediastinum/Nodes: No enlarged mediastinal or axillary lymph nodes. Thyroid  gland, trachea, and esophagus demonstrate no significant findings. Lungs/Pleura: Diffuse upper lobe predominant emphysema. No acute airspace disease, effusion, or pneumothorax. Multiple small noncalcified pulmonary nodules  are again identified, unchanged since prior exam. Index nodules are as follows: Right upper lobe, image 31, 6 mm.  Stable. Left lower lobe, image 116, 4 mm.  Stable. A right middle lobe 3 mm nodule seen previously has calcified in the interim, consistent with granuloma. No new nodules or masses. Central airways are patent. Upper Abdomen: No acute abnormality. Musculoskeletal: No acute or destructive bony lesions. Reconstructed images demonstrate no additional findings. IMPRESSION: 1. Stable subcentimeter pulmonary nodules as above, largest measuring 6 mm. Given the greater  than 2 year stability, no further imaging workup is required. This recommendation follows the consensus statement: Guidelines for Management of Incidental Pulmonary Nodules Detected on CT Images: From the Fleischner Society 2017; Radiology 2017; 284:228-243. 2. No acute intrathoracic process. 3. Aortic Atherosclerosis (ICD10-I70.0) and Emphysema (ICD10-J43.9). Electronically Signed   By: Ozell Daring M.D.   On: 03/20/2020 23:42    Assessment & Plan:   Problem List Items Addressed This Visit     Anxiety disorder   Better on Paxil       Blood donor   Last blood donation 6 mo ago      Relevant Orders   CBC with Differential/Platelet   CAD (coronary artery disease)   Cont on  Pravastatin , Zetia , Fish oil  No CP Not using NTG Cardiology f/u -  Dr Donnice Primus        Relevant Orders   TSH   Urinalysis   CBC with Differential/Platelet   Lipid panel   PSA   Comprehensive metabolic panel with GFR   Depression   Better on Paxil       Neoplasm of uncertain behavior of skin   Mole between eyebrows - irreg color      Well adult exam - Primary    We discussed age appropriate health related issues, including available/recomended screening tests and vaccinations. Labs were ordered to be later reviewed . All questions were answered. We discussed one or more of the following - seat belt use, use of sunscreen/sun  exposure exercise, safe sex, fall risk reduction, second hand smoke exposure, firearm use and storage, seat belt use, a need for adhering to healthy diet and exercise. Labs were ordered.  All questions were answered. Colon done in 2013, 2019 Dr Avram Rectal exam - pt deferred He can get RSV vaccine      Relevant Orders   TSH   Urinalysis   CBC with Differential/Platelet   Lipid panel   PSA   Comprehensive metabolic panel with GFR      No orders of the defined types were placed in this encounter.     Follow-up: Return in about 3 months (around 09/10/2024) for a follow-up visit.  Marolyn Noel, MD

## 2024-06-12 NOTE — Assessment & Plan Note (Signed)
 Mole between eyebrows - irreg color

## 2024-06-12 NOTE — Assessment & Plan Note (Signed)
 Cont on  Pravastatin , Zetia , Fish oil  No CP Not using NTG Cardiology f/u -  Dr Donnice Primus

## 2024-06-13 ENCOUNTER — Other Ambulatory Visit: Payer: Self-pay | Admitting: Internal Medicine

## 2024-06-13 NOTE — Telephone Encounter (Unsigned)
 Copied from CRM #[id number]. Topic: Clinical - Medication Refill >> Jun 13, 2024  8:35 AM Roselie BROCKS wrote: Medication: PARoxetine  (PAXIL ) 10 MG tablet  Has the patient contacted their pharmacy? No (Agent: If no, request that the patient contact the pharmacy for the refill. If patient does not wish to contact the pharmacy document the reason why and proceed with request.) (Agent: If yes, when and what did the pharmacy advise?)  This is the patient's preferred pharmacy:  [facility] [address] [place], [place] - [address] [place] [address] Phone: [phone] Fax: [phone]  Is this the correct pharmacy for this prescription? Yes If no, delete pharmacy and type the correct one.   Has the prescription been filled recently? No   Is the patient out of the medication? No has 1 left  Has the patient been seen for an appointment in the last year OR does the patient have an upcoming appointment? Yes  Can we respond through MyChart? Yes  Agent: Please be advised that Rx refills may take up to 3 business days. We ask that you follow-up with your pharmacy.

## 2024-06-15 ENCOUNTER — Ambulatory Visit: Payer: Self-pay | Admitting: Internal Medicine

## 2024-07-23 ENCOUNTER — Ambulatory Visit: Payer: Medicare HMO

## 2024-07-26 ENCOUNTER — Ambulatory Visit

## 2024-07-26 VITALS — BP 126/70 | HR 66 | Ht 68.5 in | Wt 182.8 lb

## 2024-07-26 DIAGNOSIS — Z0000 Encounter for general adult medical examination without abnormal findings: Secondary | ICD-10-CM

## 2024-07-26 NOTE — Progress Notes (Cosign Needed Addendum)
 "  Chief Complaint  Patient presents with   Medicare Wellness     Subjective:   [name] is a 74 y.o. male who presents for a Medicare Annual Wellness Visit.  Visit info / Clinical Intake: Medicare Wellness Visit Type:: Subsequent Annual Wellness Visit Persons participating in visit and providing information:: patient Medicare Wellness Visit Mode:: In-person (required for WTM) Interpreter Needed?: No Pre-visit prep was completed: yes AWV questionnaire completed by patient prior to visit?: yes Date:: 07/20/24 Living arrangements:: (Patient-Rptd) lives with spouse/significant other Patient's Overall Health Status Rating: (Patient-Rptd) good Typical amount of pain: (Patient-Rptd) some Does pain affect daily life?: (Patient-Rptd) no Are you currently prescribed opioids?: no  Dietary Habits and Nutritional Risks How many meals a day?: (Patient-Rptd) 3 Eats fruit and vegetables daily?: (Patient-Rptd) yes Most meals are obtained by: (Patient-Rptd) preparing own meals Diabetic:: no  Functional Status Activities of Daily Living (to include ambulation/medication): (Patient-Rptd) Independent Ambulation: (Patient-Rptd) Independent Medication Administration: (Patient-Rptd) Independent Home Management (perform basic housework or laundry): (Patient-Rptd) Independent Manage your own finances?: (Patient-Rptd) yes Primary transportation is: (Patient-Rptd) driving Concerns about vision?: no *vision screening is required for WTM* Concerns about hearing?: (!) yes Uses hearing aids?: (!) yes Hear whispered voice?: yes  Fall Screening Falls in the past year?: (Patient-Rptd) 0 Number of falls in past year: 0 Was there an injury with Fall?: 0 Fall Risk Category Calculator: 0 Patient Fall Risk Level: Low Fall Risk  Fall Risk Patient at Risk for Falls Due to: No Fall Risks Fall risk Follow up: Falls evaluation completed; Falls prevention discussed  Home and Transportation  Safety: All rugs have non-skid backing?: (Patient-Rptd) N/A, no rugs All stairs or steps have railings?: (Patient-Rptd) yes Grab bars in the bathtub or shower?: (Patient-Rptd) yes Have non-skid surface in bathtub or shower?: (Patient-Rptd) yes Good home lighting?: (Patient-Rptd) yes Regular seat belt use?: (Patient-Rptd) yes Hospital stays in the last year:: (Patient-Rptd) no  Cognitive Assessment Difficulty concentrating, remembering, or making decisions? : (Patient-Rptd) no Will 6CIT or Mini Cog be Completed: no 6CIT or Mini Cog Declined: patient alert, oriented, able to answer questions appropriately and recall recent events  Advance Directives (For Healthcare) Does Patient Have a Medical Advance Directive?: No  Reviewed/Updated  Reviewed/Updated: Reviewed All (Medical, Surgical, Family, Medications, Allergies, Care Teams, Patient Goals)    Allergies (verified) Atorvastatin, Bee venom, Crestor  [rosuvastatin  calcium ], and Statins   Current Medications (verified) Outpatient Encounter Medications as of 07/26/2024  Medication Sig   aspirin  81 MG tablet Take 81 mg by mouth daily.   cholecalciferol  (VITAMIN D) 1000 UNITS tablet Take 2,000 Units by mouth daily.   diazepam  (VALIUM ) 10 MG tablet TAKE 1/2 TO 1 (ONE-HALF TO ONE) BY MOUTH TWICE DAILY AS NEEDED FOR ANXIETY   ezetimibe  (ZETIA ) 10 MG tablet Take 1 tablet (10 mg total) by mouth daily.   fenofibrate  (TRICOR ) 48 MG tablet Take 1 tablet (48 mg total) by mouth daily.   finasteride  (PROSCAR ) 5 MG tablet Take 1 tablet (5 mg total) by mouth daily.   levothyroxine  (SYNTHROID ) 88 MCG tablet Take 1 tablet (88 mcg total) by mouth daily before breakfast.   losartan  (COZAAR ) 100 MG tablet Take 1 tablet (100 mg total) by mouth daily.   Omega-3 Fatty Acids (FISH OIL ) 1000 MG CAPS Take 2 capsules (2,000 mg total) by mouth daily.   pantoprazole  (PROTONIX ) 40 MG tablet Take 1 tablet (40 mg total) by mouth daily as needed.   PARoxetine  (PAXIL )  10 MG tablet Take 1  tablet (10 mg total) by mouth daily.   pravastatin  (PRAVACHOL ) 20 MG tablet Take 1 tablet (20 mg total) by mouth daily.   predniSONE  (STERAPRED UNI-PAK 21 TAB) 10 MG (21) TBPK tablet As directed x 6 days   No facility-administered encounter medications on file as of 07/26/2024.    History: Past Medical History:  Diagnosis Date   Allergy    Allergy to bee sting    Anxiety    CAD (coronary artery disease)    statin intolerant   Cyst    Depression    Dermatitis due to sun    ED (erectile dysfunction)    Hyperlipidemia    Hypertension    MI (myocardial infarction) (HCC) 2002   OA (osteoarthritis)    Tobacco abuse    Past Surgical History:  Procedure Laterality Date   broken jaw     1970-80's    cardiac catherization  07-14-01   COLONOSCOPY  2013   ESOPHAGOGASTRODUODENOSCOPY  10-06-01   stress cardiolite  06-30-01   UPPER GASTROINTESTINAL ENDOSCOPY     Family History  Problem Relation Age of Onset   Cancer Brother        lymphoma   Heart disease Father    Colon cancer Neg Hx    Esophageal cancer Neg Hx    Rectal cancer Neg Hx    Stomach cancer Neg Hx    Colon polyps Neg Hx    Social History   Occupational History   Occupation: Retired    Comment: 12/2012  Tobacco Use   Smoking status: Former    Current packs/day: 0.00    Average packs/day: 2.0 packs/day    Types: Cigarettes    Quit date: 11/02/2012    Years since quitting: 11.7   Smokeless tobacco: Never  Vaping Use   Vaping status: Never Used  Substance and Sexual Activity   Alcohol use: Not Currently    Alcohol/week: 12.0 standard drinks of alcohol    Types: 12 Cans of beer per week    Comment: Quit 07/06/2021   Drug use: Yes    Types: Marijuana    Comment: marijuana   Sexual activity: Yes   Tobacco Counseling Counseling given: Not Answered  SDOH Screenings   Food Insecurity: No Food Insecurity (07/26/2024)  Housing: Unknown (07/26/2024)  Transportation Needs: No Transportation Needs  (07/26/2024)  Utilities: Not At Risk (07/26/2024)  Alcohol Screen: Low Risk (03/08/2024)  Depression (PHQ2-9): Low Risk (07/26/2024)  Financial Resource Strain: Low Risk (06/05/2024)  Physical Activity: Insufficiently Active (07/26/2024)  Social Connections: Socially Integrated (07/26/2024)  Recent Concern: Social Connections - Moderately Isolated (06/05/2024)  Stress: No Stress Concern Present (07/26/2024)  Recent Concern: Stress - Stress Concern Present (06/05/2024)  Tobacco Use: Medium Risk (07/26/2024)  Health Literacy: Adequate Health Literacy (07/26/2024)   See flowsheets for full screening details  Depression Screen PHQ 2 & 9 Depression Scale- Over the past 2 weeks, how often have you been bothered by any of the following problems? Little interest or pleasure in doing things: 0 Feeling down, depressed, or hopeless (PHQ Adolescent also includes...irritable): 0 PHQ-2 Total Score: 0 Trouble falling or staying asleep, or sleeping too much: 0 Feeling tired or having little energy: 0 Poor appetite or overeating (PHQ Adolescent also includes...weight loss): 0 Feeling bad about yourself - or that you are a failure or have let yourself or your family down: 0 Trouble concentrating on things, such as reading the newspaper or watching television (PHQ Adolescent also includes...like school work): 0 Moving or  speaking so slowly that other people could have noticed. Or the opposite - being so fidgety or restless that you have been moving around a lot more than usual: 0 Thoughts that you would be better off dead, or of hurting yourself in some way: 0 PHQ-9 Total Score: 0 If you checked off any problems, how difficult have these problems made it for you to do your work, take care of things at home, or get along with other people?: Not difficult at all  Depression Treatment Depression Interventions/Treatment : EYV7-0 Score <4 Follow-up Not Indicated     Goals Addressed               This Visit's  Progress     Patient Stated (pt-stated)        To keep breathing/2026             Objective:    Today's Vitals   07/26/24 1316  BP: 126/70  Pulse: 66  SpO2: 97%  Weight: 182 lb 12.8 oz (82.9 kg)  Height: 5' 8.5 (1.74 m)   Body mass index is 27.39 kg/m.  Hearing/Vision screen Hearing Screening - Comments:: Wears hearing aides Vision Screening - Comments:: Wears eyeglasses/not UTD/[facility] in 4 seasons Immunizations and Health Maintenance Health Maintenance  Topic Date Due   COVID-19 Vaccine (7 - 2025-26 season) 03/05/2024   Colonoscopy  04/04/2025   Medicare Annual Wellness (AWV)  07/26/2025   DTaP/Tdap/Td (3 - Td or Tdap) 09/17/2031   Pneumococcal Vaccine: 50+ Years  Completed   Influenza Vaccine  Completed   Hepatitis C Screening  Completed   Zoster Vaccines- Shingrix  Completed   Meningococcal B Vaccine  Aged Out        Assessment/Plan:  This is a routine wellness examination for Icker.  Patient Care Team: Plotnikov, Karlynn GAILS, MD as PCP - General Carlie Clark, MD as Consulting Physician (Otolaryngology) [facility] Dann Candyce RAMAN, MD as Consulting Physician (Cardiology)  I have personally reviewed and noted the following in the patients chart:   Medical and social history Use of alcohol, tobacco or illicit drugs  Current medications and supplements including opioid prescriptions. Functional ability and status Nutritional status Physical activity Advanced directives List of other physicians Hospitalizations, surgeries, and ER visits in previous 12 months Vitals Screenings to include cognitive, depression, and falls Referrals and appointments  No orders of the defined types were placed in this encounter.  In addition, I have reviewed and discussed with patient certain preventive protocols, quality metrics, and best practice recommendations. A written personalized care plan for preventive services as well as general preventive  health recommendations were provided to patient.   Emmalyne Giacomo L Kinsler Soeder, CMA   07/26/2024   Return in 1 year (on 07/26/2025).  After Visit Summary: (MyChart) Due to this being a telephonic visit, the after visit summary with patients personalized plan was offered to patient via MyChart   Nurse Notes: No voiced or noted concerns at this time.    Medical screening examination/treatment/procedure(s) were performed by non-physician practitioner and as supervising physician I was immediately available for consultation/collaboration.  I agree with above. Karlynn Noel, MD "

## 2024-07-26 NOTE — Patient Instructions (Addendum)
 [name],  Thank you for taking the time for your Medicare Wellness Visit. I appreciate your continued commitment to your health goals. Please review the care plan we discussed, and feel free to reach out if I can assist you further.  Please note that Annual Wellness Visits do not include a physical exam. Some assessments may be limited, especially if the visit was conducted virtually. If needed, we may recommend an in-person follow-up with your provider.  Ongoing Care Seeing your primary care provider every 3 to 6 months helps us  monitor your health and provide consistent, personalized care. Next office visit on 09/11/2024.  Keep up the good work.  Referrals If a referral was made during today's visit and you haven't received any updates within two weeks, please contact the referred provider directly to check on the status.  Recommended Screenings:  Health Maintenance  Topic Date Due   COVID-19 Vaccine (7 - 2025-26 season) 03/05/2024   Medicare Annual Wellness Visit  07/20/2024   Colon Cancer Screening  04/04/2025   DTaP/Tdap/Td vaccine (3 - Td or Tdap) 09/17/2031   Pneumococcal Vaccine for age over 8  Completed   Flu Shot  Completed   Hepatitis C Screening  Completed   Zoster (Shingles) Vaccine  Completed   Meningitis B Vaccine  Aged Out       07/20/2024    8:40 AM  Advanced Directives  Does Patient Have a Medical Advance Directive? No    Vision: Annual vision screenings are recommended for early detection of glaucoma, cataracts, and diabetic retinopathy. These exams can also reveal signs of chronic conditions such as diabetes and high blood pressure.  Dental: Annual dental screenings help detect early signs of oral cancer, gum disease, and other conditions linked to overall health, including heart disease and diabetes.  Please see the attached documents for additional preventive care recommendations.

## 2024-09-11 ENCOUNTER — Ambulatory Visit: Admitting: Internal Medicine

## 2024-09-21 ENCOUNTER — Ambulatory Visit: Admitting: Internal Medicine
